# Patient Record
Sex: Male | Born: 1970 | Race: White | Hispanic: No | Marital: Married | Smoking: Former smoker
Health system: Southern US, Community
[De-identification: ages and names within clinical notes are randomized; demographics above are authoritative.]

## PROBLEM LIST (undated history)

## (undated) DIAGNOSIS — F32A Depression, unspecified: Secondary | ICD-10-CM

## (undated) DIAGNOSIS — L700 Acne vulgaris: Secondary | ICD-10-CM

## (undated) DIAGNOSIS — J189 Pneumonia, unspecified organism: Secondary | ICD-10-CM

## (undated) DIAGNOSIS — E781 Pure hyperglyceridemia: Secondary | ICD-10-CM

## (undated) DIAGNOSIS — IMO0002 Reserved for concepts with insufficient information to code with codable children: Secondary | ICD-10-CM

## (undated) DIAGNOSIS — J45909 Unspecified asthma, uncomplicated: Secondary | ICD-10-CM

## (undated) DIAGNOSIS — K429 Umbilical hernia without obstruction or gangrene: Secondary | ICD-10-CM

## (undated) DIAGNOSIS — F419 Anxiety disorder, unspecified: Secondary | ICD-10-CM

## (undated) DIAGNOSIS — G629 Polyneuropathy, unspecified: Secondary | ICD-10-CM

## (undated) DIAGNOSIS — F329 Major depressive disorder, single episode, unspecified: Secondary | ICD-10-CM

## (undated) DIAGNOSIS — M1990 Unspecified osteoarthritis, unspecified site: Secondary | ICD-10-CM

## (undated) DIAGNOSIS — C801 Malignant (primary) neoplasm, unspecified: Secondary | ICD-10-CM

## (undated) DIAGNOSIS — G709 Myoneural disorder, unspecified: Secondary | ICD-10-CM

## (undated) HISTORY — PX: KNEE ARTHROSCOPY: SHX127

## (undated) HISTORY — DX: Pure hyperglyceridemia: E78.1

## (undated) HISTORY — DX: Unspecified asthma, uncomplicated: J45.909

## (undated) HISTORY — DX: Depression, unspecified: F32.A

## (undated) HISTORY — PX: TONSILLECTOMY: SHX5217

## (undated) HISTORY — DX: Anxiety disorder, unspecified: F41.9

## (undated) HISTORY — DX: Major depressive disorder, single episode, unspecified: F32.9

## (undated) HISTORY — PX: POLYPECTOMY: SHX149

## (undated) HISTORY — PX: OTHER SURGICAL HISTORY: SHX169

## (undated) HISTORY — PX: HUMERUS FRACTURE SURGERY: SHX670

## (undated) HISTORY — DX: Reserved for concepts with insufficient information to code with codable children: IMO0002

## (undated) HISTORY — PX: COLONOSCOPY: SHX174

## (undated) HISTORY — PX: KNEE ARTHROSCOPY: SUR90

## (undated) HISTORY — DX: Acne vulgaris: L70.0

## (undated) HISTORY — DX: Malignant (primary) neoplasm, unspecified: C80.1

---

## 1998-05-20 HISTORY — PX: DEBRIDEMENT TENNIS ELBOW: SHX1442

## 1999-11-05 ENCOUNTER — Encounter: Admission: RE | Admit: 1999-11-05 | Discharge: 1999-11-05 | Payer: Self-pay | Admitting: Orthopedic Surgery

## 1999-11-05 ENCOUNTER — Encounter: Payer: Self-pay | Admitting: Orthopedic Surgery

## 2000-09-23 ENCOUNTER — Ambulatory Visit (HOSPITAL_BASED_OUTPATIENT_CLINIC_OR_DEPARTMENT_OTHER): Admission: RE | Admit: 2000-09-23 | Discharge: 2000-09-23 | Payer: Self-pay | Admitting: Plastic Surgery

## 2000-10-08 ENCOUNTER — Emergency Department (HOSPITAL_COMMUNITY): Admission: EM | Admit: 2000-10-08 | Discharge: 2000-10-08 | Payer: Self-pay | Admitting: Emergency Medicine

## 2003-06-07 ENCOUNTER — Emergency Department (HOSPITAL_COMMUNITY): Admission: EM | Admit: 2003-06-07 | Discharge: 2003-06-07 | Payer: Self-pay | Admitting: *Deleted

## 2005-05-20 DIAGNOSIS — IMO0002 Reserved for concepts with insufficient information to code with codable children: Secondary | ICD-10-CM

## 2005-05-20 HISTORY — DX: Reserved for concepts with insufficient information to code with codable children: IMO0002

## 2009-04-28 ENCOUNTER — Emergency Department (HOSPITAL_COMMUNITY): Admission: EM | Admit: 2009-04-28 | Discharge: 2009-04-28 | Payer: Self-pay | Admitting: Family Medicine

## 2010-10-05 NOTE — Op Note (Signed)
[place]. [facility]  Patient:    LIBORIO, SACCENTE                         MRN: [id number] Proc. Date: 09/23/00 Adm. Date:  [id number] Attending:  Loura Halt Ii                           Operative Report  PREOPERATIVE DIAGNOSIS: 1. A 1.5 cm sebaceous cyst right malar area. 2. A 1 cm sebaceous cyst, left earlobe. 3. Sebaceous cyst x 2, left post auricular sulcus, each cyst approximately    4 mm in diameter.  POSTOPERATIVE DIAGNOSIS: 1. A 1.5 cm sebaceous cyst right malar area. 2. A 1 cm sebaceous cyst, left earlobe. 3. Sebaceous cyst x 2, left post auricular sulcus, each cyst approximately    4 mm in diameter.  OPERATION PERFORMED:  Excision of sebaceous cyst x 4.  SURGEON:  Alfredia Ferguson, M.D.  ANESTHESIA:  2% Xylocaine with 1:100,000 epinephrine.  INDICATIONS FOR PROCEDURE:  The patient is a 40 year old gentleman with a history of many sebaceous cysts of his face.  He has longstanding history of acne which is under control at the present time.  These cysts have been enlarging and he wishes to have them removed.  He understands that he will be trading what he has for permanent potentially unsightly scar.  He understands the risk of recurrence of the cysts.  In spite of that he wishes to proceed with the surgery.  DESCRIPTION OF PROCEDURE:  Skin markers were placed over the right malar cyst and local anesthesia was infiltrated using 2% Xylocaine 1:100,000 epinephrine. Elliptical skin marker was placed in the posterior aspect of the left earlobe. There was a previous scar from an excision in the past.  Local anesthesia was infiltrated in a similar fashion.  The patient had two cysts on the postauricular sulcus, each separated by about 1 cm.  Skin markers were placed over the two cysts and local anesthesia was infiltrated.  The right malar area was prepped and draped in sterile fashion.  After waiting approximately 10 minutes, a 1 cm incision  was made directly over the cyst.  The cyst was visualized and it was dissected out from the surrounding tissue without difficulty.  The cyst was passed off for pathology.  Hemostasis was accomplished using pressure.  The wound was closed by approximating the subcutaneous tissue using 5-0 Vicryl suture.  The skin was united using a running 6-0 nylon suture.  Attention was directed to the earlobe cyst.  It was prepped and draped in a sterile fashion.  An ellipse of skin was incised. Using scissor dissection, the cyst was dissected away from the surrounding tissue.  The cyst was noted to be densely adherent to the undersurface of the anterior portion of the earlobe.  A counter incision in the anterior portion of the earlobe was made in order to excise a small ellipse of skin which was densely adherent to the cyst.  The cyst was removed along with a small portion of anterior earlobe skin.  Hemostasis was accomplished using pressure. The anterior earlobe incision was closed using interrupted 6-0 nylon suture.  The posterior earlobe incision was closed in a similar fashion.  The postauricular cysts were first incised, elevating the skin off the cysts.  The upper cyst was removed without difficulty.  The lower cyst I excised a small ellipse of skin  because there was a dimple in the skin over this cyst.  Both cysts were removed without difficulty.  These two incisions were closed using interrupted 5-0 nylon.  The patient tolerated the procedure well with minimal blood loss. light dressings were applied to all the incisions.  The patient was discharged home in satisfactory condition. DD:  09/23/00 TD:  09/23/00 Job: [id number]

## 2011-06-17 ENCOUNTER — Encounter: Payer: Self-pay | Admitting: Family Medicine

## 2011-06-17 ENCOUNTER — Ambulatory Visit (INDEPENDENT_AMBULATORY_CARE_PROVIDER_SITE_OTHER): Payer: BC Managed Care – PPO | Admitting: Family Medicine

## 2011-06-17 VITALS — BP 102/78 | HR 60 | Ht 73.0 in | Wt 187.0 lb

## 2011-06-17 DIAGNOSIS — R5381 Other malaise: Secondary | ICD-10-CM

## 2011-06-17 DIAGNOSIS — E781 Pure hyperglyceridemia: Secondary | ICD-10-CM

## 2011-06-17 DIAGNOSIS — IMO0002 Reserved for concepts with insufficient information to code with codable children: Secondary | ICD-10-CM

## 2011-06-17 DIAGNOSIS — Z0000 Encounter for general adult medical examination without abnormal findings: Secondary | ICD-10-CM

## 2011-06-17 DIAGNOSIS — Z8042 Family history of malignant neoplasm of prostate: Secondary | ICD-10-CM

## 2011-06-17 DIAGNOSIS — M542 Cervicalgia: Secondary | ICD-10-CM | POA: Insufficient documentation

## 2011-06-17 LAB — CBC WITH DIFFERENTIAL/PLATELET
Eosinophils Absolute: 0.1 10*3/uL (ref 0.0–0.7)
Eosinophils Relative: 1 % (ref 0–5)
HCT: 47.3 % (ref 39.0–52.0)
Hemoglobin: 15.6 g/dL (ref 13.0–17.0)
Lymphs Abs: 2.6 10*3/uL (ref 0.7–4.0)
MCH: 31.7 pg (ref 26.0–34.0)
MCV: 96.1 fL (ref 78.0–100.0)
Monocytes Absolute: 0.5 10*3/uL (ref 0.1–1.0)
Monocytes Relative: 8 % (ref 3–12)
Neutrophils Relative %: 48 % (ref 43–77)
RBC: 4.92 MIL/uL (ref 4.22–5.81)

## 2011-06-17 LAB — COMPREHENSIVE METABOLIC PANEL
AST: 23 U/L (ref 0–37)
Alkaline Phosphatase: 54 U/L (ref 39–117)
BUN: 9 mg/dL (ref 6–23)
Creat: 0.8 mg/dL (ref 0.50–1.35)
Glucose, Bld: 85 mg/dL (ref 70–99)
Total Bilirubin: 0.4 mg/dL (ref 0.3–1.2)

## 2011-06-17 LAB — POCT URINALYSIS DIPSTICK
Blood, UA: NEGATIVE
Glucose, UA: NEGATIVE
Ketones, UA: NEGATIVE
Spec Grav, UA: 1.01

## 2011-06-17 LAB — LIPID PANEL
Cholesterol: 159 mg/dL (ref 0–200)
LDL Cholesterol: 97 mg/dL (ref 0–99)
Total CHOL/HDL Ratio: 4 Ratio
Triglycerides: 110 mg/dL (ref <150)
VLDL: 22 mg/dL (ref 0–40)

## 2011-06-17 NOTE — Progress Notes (Signed)
Robert Hatfield is a 41 y.o. male who presents for a complete physical.  He has the following concerns: None.  Has chronic neck pain, but no radiculopathy.  Has had many cortisone shots and nerve block through Dr. Ethelene Hal.  Has daily pain that is usually adequately controlled with Aleve, taking 2 in the morning, and occasionally in the evenings when needed  Health Maintenance:  There is no immunization history on file for this patient. Last tetanus was approximately 2 years ago.  Unsure of type.  Will check records Last colonoscopy: never Last PSA: never Dentist: within 2 years Ophtho: never.  Having trouble reading Exercise: doing a boot camp 5 days/week for the last month.  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 Ethelene Hal)  . Pure hyperglyceridemia   . Acne vulgaris     s/p accutane treatment  . Depression 2000-2002    treated with Zoloft and Klonopin x 1-2 yrs    Past Surgical History  Procedure Date  . Humerus fracture surgery age 89    R arm  . Tennis elbow repair     R arm  . Tonsillectomy child    History   Social History  . Marital Status: Married    Spouse Name: N/A    Number of Children: 2  . Years of Education: N/A   Occupational History  . general contractor    Social History Main Topics  . Smoking status: Former Smoker    Quit date: 05/21/2003  . Smokeless tobacco: Never Used  . Alcohol Use: Yes     usually 1 drink/day (not in January)  . Drug Use: No  . Sexually Active: Yes -- Male partner(s)     wife has IUD   Other Topics Concern  . Not on file   Social History Narrative   Lives with wife, 2 sons and a dog    Family History  Problem Relation Age of Onset  . Depression Mother   . Hypertension Father   . Cancer Father     prostate cancer, 62's  . Depression Brother   . Cancer Paternal Grandmother     breast cancer  . Diabetes Neg Hx   . Heart disease Neg Hx     Current outpatient prescriptions:naproxen sodium (ANAPROX) 220 MG tablet, Take 440 mg by mouth 2 (two) times daily., Disp: , Rfl:   No Known Allergies  ROS:  The patient denies anorexia, fever, weight changes, headaches,  vision loss, decreased hearing, ear pain, hoarseness, chest pain, palpitations, dizziness, syncope, dyspnea on exertion, cough, swelling, nausea, vomiting, diarrhea, constipation, abdominal pain, melena, hematochezia, indigestion/heartburn, hematuria, incontinence, erectile dysfunction, nocturia, weakened urine stream, dysuria, genital lesions, tremor, suspicious skin lesions, depression, anxiety, abnormal bleeding/bruising, or enlarged lymph nodes +neck pain; some weakness in fingers, notices when opening things.  Occasional numbness/tingling in L hand (chronic/unchanged)  PHYSICAL EXAM: BP 102/78  Pulse 60  Ht 6\' 1"  (1.854 m)  Wt 187 lb (84.823 kg)  BMI 24.67 kg/m2  General Appearance:    Alert, cooperative, no distress, appears stated age  Head:    Normocephalic, without obvious abnormality, atraumatic  Eyes:    PERRL, conjunctiva/corneas clear, EOM's intact, fundi    benign  Ears:    Normal TM's and external ear canals  Nose:   Nares normal, mucosa normal, no drainage or sinus   tenderness  Throat:   Lips, mucosa, and tongue normal;  teeth and gums normal  Neck:   Supple, no lymphadenopathy;  thyroid:  no   enlargement/tenderness/nodules; no carotid   bruit or JVD  Back:    Spine nontender, no curvature, ROM normal, no CVA     tenderness  Lungs:     Clear to auscultation bilaterally without wheezes, rales or     ronchi; respirations unlabored  Chest Wall:    No tenderness or deformity   Heart:    Regular rate and rhythm, S1 and S2 normal, no murmur, rub   or gallop  Breast Exam:    No chest wall tenderness, masses or gynecomastia  Abdomen:     Soft, non-tender, nondistended, normoactive bowel sounds,    no masses, no hepatosplenomegaly  Genitalia:    Normal  male external genitalia without lesions.  Testicles without masses.  No inguinal hernias.  Rectal:    Normal sphincter tone, no masses or tenderness; guaiac negative stool.  Prostate smooth, no nodules, not enlarged.  Extremities:   No clubbing, cyanosis or edema  Pulses:   2+ and symmetric all extremities  Skin:   Skin color, texture, turgor normal, no rashes or lesions  Lymph nodes:   Cervical, supraclavicular, and axillary nodes normal  Neurologic:   CNII-XII intact, normal strength, sensation and gait; Diminished reflex at L biceps, normal on right.           Psych:   Normal mood, affect, hygiene and grooming.    ASSESSMENT/PLAN:  1. Routine general medical examination at a health care facility  POCT Urinalysis Dipstick, Visual acuity screening, PSA, Comprehensive metabolic panel, CBC with Differential  2. Family history of prostate cancer  PSA  3. Pure hyperglyceridemia  Lipid panel  4. Other malaise and fatigue  Comprehensive metabolic panel, CBC with Differential  5. Bulging discs    6. Cervicalgia     Discussed PSA screening (risks/benefits) in great detail, and wishes to proceed, understanding that any abnormal test requires lengthy discussion with urologist regarding treatment options.  Recommended at least 30 minutes of aerobic activity at least 5 days/week; proper sunscreen use reviewed; healthy diet and alcohol recommendations (less than or equal to 2 drinks/day) reviewed; regular seatbelt use; changing batteries in smoke detectors. Self-testicular exams. Immunization recommendations discussed--need to review prior records.  Colonoscopy recommendations reviewed--age 37.   Declines flu shot, recommended for next year Sign release form for immunizations through [facility] (or [facility] to check dates and type of last tetanus Past due for dentist--please schedule routine screening Routine eye exam is recommended

## 2011-06-17 NOTE — Patient Instructions (Signed)

## 2011-06-18 ENCOUNTER — Encounter: Payer: Self-pay | Admitting: Family Medicine

## 2011-07-18 ENCOUNTER — Encounter: Payer: Self-pay | Admitting: *Deleted

## 2012-03-13 ENCOUNTER — Encounter: Payer: Self-pay | Admitting: Internal Medicine

## 2012-04-09 ENCOUNTER — Encounter: Payer: Self-pay | Admitting: Family Medicine

## 2012-04-09 ENCOUNTER — Ambulatory Visit (INDEPENDENT_AMBULATORY_CARE_PROVIDER_SITE_OTHER): Payer: 59 | Admitting: Family Medicine

## 2012-04-09 VITALS — BP 138/88 | HR 76 | Ht 73.0 in | Wt 192.0 lb

## 2012-04-09 DIAGNOSIS — F411 Generalized anxiety disorder: Secondary | ICD-10-CM | POA: Insufficient documentation

## 2012-04-09 DIAGNOSIS — Z23 Encounter for immunization: Secondary | ICD-10-CM

## 2012-04-09 MED ORDER — ALPRAZOLAM 0.5 MG PO TABS: 0.2500 mg | ORAL_TABLET | Freq: Three times a day (TID) | ORAL | Status: DC | PRN

## 2012-04-09 NOTE — Progress Notes (Signed)
Chief Complaint  Patient presents with  . Anxiety    due to increased stress. Has been going on about 10 days, not getting any better. Tried to get an appt with Dr.Kaur, cannot see until Jan 23rd.   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  In the last 10 days things at work have gotten even more stressful, and he is feeling completely overwhelmed, chest pressure like someone is standing on him.  Denies shortness of breath, heart racing. The pressure is constant, all day long. Trouble falling asleep, which has been somewhat chronic, but worse recently.  Hasn't been able to get any exercise, too busy, too much going on, can't focus on much.  Business stress, which is leading to financial distress. He has not increased alcohol intake or tried to self-medicate anyway.  He is here to seek help for his anxiety.  He called Dr. Evelene Croon and Dr. Nolen Mu (whom he previously saw) but appointments are too far out.  Denies h/o anxiety.  Has had some mild issues with depression in the past, treated with zoloft in the past.  Records reviewed--last Td 09/2000.  He never returned call to arrange for [place] for Tdap. Agreeable to getting today.  Past Medical History  Diagnosis Date  . 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 Ethelene Hal)  . Pure hyperglyceridemia   . Acne vulgaris     s/p accutane treatment  . Depression 2000-2002    treated with Zoloft and Klonopin x 1-2 yrs   Past Surgical History  Procedure Date  . Humerus fracture surgery age 71    R arm  . Tennis elbow repair     R arm  . Tonsillectomy child   History   Social History  . Marital Status: Married    Spouse Name: N/A    Number of Children: 2  . Years of Education: N/A   Occupational History  . general contractor    Social History Main Topics  . Smoking status: Former Smoker    Quit date: 05/21/2003  . Smokeless tobacco: Never Used  . Alcohol Use: Yes     Comment: usually 1  drink/day (not in January)  . Drug Use: No  . Sexually Active: Yes -- Male partner(s)     Comment: wife has IUD   Other Topics Concern  . Not on file   Social History Narrative   Lives with wife, 2 sons and a dog   No current outpatient prescriptions on file prior to visit.   No Known Allergies  ROS:  Denies headaches, weight change, fever, URI symptoms.  +anxiety, + insomnia.  No nausea, vomiting, diarrhea, heartburn, exertional chest pain, shortness of breath or other concerns except per HPI.  PHYSICAL EXAM: BP 138/88  Pulse 76  Ht 6\' 1"  (1.854 m)  Wt 192 lb (87.091 kg)  BMI 25.33 kg/m2 Well developed male, in no distress Neck: no lymphadenopathy, thyromegaly or mass Heart: regular rate and rhythm without murmur Lungs: clear bilaterally Psych: flat affect,  Normal speech, hygiene and grooming.  Fairly good eye contact.  ASSESSMENT/PLAN: 1. Anxiety state, unspecified  ALPRAZolam (XANAX) 0.5 MG tablet  2. Need for Tdap vaccination  Tdap vaccine greater than or equal to 7yo IM   Xanax 0.5 #30--risks and side effects reviewed.  Short-term only. Counseling encouraged--given names of counselors, and can check with insurance.    Counseled regarding stress reduction techniques, exercise.  F/u here  or with psych if not improving, for daily/preventative treatment of anxiety, but for now I think short-term use of benzo with counseling is appropriate.  25 min visit, more than 1/2 counseling.

## 2012-04-09 NOTE — Patient Instructions (Signed)
Use the xanax with caution while driving/working.  Do not mix with alcohol. Counseling is encouraged.  I gave you phone numbers for Berniece Andreas and Maxcine Ham.  If they do not take your insurance, feel free to call me for recommendations based on the list of people who do take your insurance.  If symptoms are not resolving with counseling, then you may need a daily medication to treat/prevent anxiety, rather than just using the xanax.  You can either return here to discuss, versus seeing Dr. Evelene Croon or Dr. Nolen Mu.

## 2012-04-27 ENCOUNTER — Ambulatory Visit (INDEPENDENT_AMBULATORY_CARE_PROVIDER_SITE_OTHER): Payer: 59 | Admitting: Licensed Clinical Social Worker

## 2012-04-27 DIAGNOSIS — F411 Generalized anxiety disorder: Secondary | ICD-10-CM

## 2012-05-04 ENCOUNTER — Ambulatory Visit (INDEPENDENT_AMBULATORY_CARE_PROVIDER_SITE_OTHER): Payer: 59 | Admitting: Licensed Clinical Social Worker

## 2012-05-04 DIAGNOSIS — F411 Generalized anxiety disorder: Secondary | ICD-10-CM

## 2012-05-22 ENCOUNTER — Telehealth: Payer: Self-pay | Admitting: Internal Medicine

## 2012-05-22 ENCOUNTER — Ambulatory Visit: Payer: 59 | Admitting: Licensed Clinical Social Worker

## 2012-05-22 DIAGNOSIS — F411 Generalized anxiety disorder: Secondary | ICD-10-CM

## 2012-05-22 MED ORDER — ALPRAZOLAM 0.5 MG PO TABS: 0.2500 mg | ORAL_TABLET | Freq: Three times a day (TID) | ORAL | Status: DC | PRN

## 2012-05-22 NOTE — Telephone Encounter (Signed)
It appears that he has started counseling.  Therefore okay to refill #30 (only short-term use of med planned, if needing longer, will need to start preventative med).  Please call in #30, no refill.  thanks

## 2012-05-22 NOTE — Telephone Encounter (Signed)
Called med into [facility]

## 2013-05-07 ENCOUNTER — Ambulatory Visit (INDEPENDENT_AMBULATORY_CARE_PROVIDER_SITE_OTHER): Payer: Commercial Managed Care - PPO | Admitting: Medical

## 2013-05-07 ENCOUNTER — Encounter: Payer: Self-pay | Admitting: Medical

## 2013-05-07 VITALS — BP 128/90 | HR 68 | Temp 98.3°F

## 2013-05-07 DIAGNOSIS — R05 Cough: Secondary | ICD-10-CM

## 2013-05-07 DIAGNOSIS — J209 Acute bronchitis, unspecified: Secondary | ICD-10-CM

## 2013-05-07 DIAGNOSIS — R059 Cough, unspecified: Secondary | ICD-10-CM

## 2013-05-07 MED ORDER — HYDROCODONE-HOMATROPINE 5-1.5 MG/5ML PO SYRP: 5.0000 mL | ORAL_SOLUTION | Freq: Four times a day (QID) | ORAL | Status: DC | PRN

## 2013-05-07 MED ORDER — AZITHROMYCIN 250 MG PO TABS: ORAL_TABLET | ORAL | Status: DC

## 2013-05-07 NOTE — Progress Notes (Signed)
Subjective:  Robert Hatfield is a 42 y.o. male who presents for cough, illness.  Here with wife who is here for sinus pressure and cough.  Symptoms include 7-10 day hx/o severe cough, dry cough, occasional SOB, ear pressure, chest congestion, post nasal drainage, cough worse at night.   Denies fever, NVD, sinus pressure, sore throat, ear pain, rash, body aches. Treatment to date: none.  Wife here as well for illness, +sick contacts.   He does not smoke. No other aggravating or relieving factors.  No other c/o.  The following portions of the patient's history were reviewed and updated as appropriate: allergies, current medications, past family history, past medical history, past social history, past surgical history and problem list.  ROS as in subjective  Past Medical History  Diagnosis Date  . 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 Ethelene Hal)  . Pure hyperglyceridemia   . Acne vulgaris     s/p accutane treatment  . Depression 2000-2002    treated with Zoloft and Klonopin x 1-2 yrs     Objective: Vital signs reviewed  General appearance: Alert, WD/WN, no distress,somewhat  ill appearing                             Skin: warm, no rash, no diaphoresis                           Head: no sinus tenderness                            Eyes: conjunctiva normal, corneas clear, PERRLA                            Ears: pearly TMs, external ear canals normal                          Nose: septum midline, turbinates swollen, with erythema and clear discharge             Mouth/throat: MMM, tongue normal, mild pharyngeal erythema                           Neck: supple, no adenopathy, no thyromegaly, nontender                          Heart: RRR, normal S1, S2, no murmurs                         Lungs: +bronchial breath sounds, +scattered rhonchi, no wheezes, no rales                Extremities: no edema, nontender     Assessment: Encounter Diagnoses  Name Primary?  . Cough Yes  .  Acute bronchitis     Plan:  Medication orders today include: Zpak, Hycodan syrup prn.  Discussed diagnosis and treatment.  Suggested symptomatic OTC remedies for cough and congestion.  Tylenol or Ibuprofen OTC for fever and malaise.  Call/return in 2-3 days if symptoms are worse or not improving.

## 2013-09-21 ENCOUNTER — Ambulatory Visit (INDEPENDENT_AMBULATORY_CARE_PROVIDER_SITE_OTHER): Payer: Commercial Managed Care - PPO | Admitting: Medical

## 2013-09-21 ENCOUNTER — Encounter: Payer: Self-pay | Admitting: Medical

## 2013-09-21 VITALS — BP 132/80 | HR 80 | Temp 98.5°F | Resp 14 | Wt 214.0 lb

## 2013-09-21 DIAGNOSIS — L255 Unspecified contact dermatitis due to plants, except food: Secondary | ICD-10-CM

## 2013-09-21 DIAGNOSIS — K429 Umbilical hernia without obstruction or gangrene: Secondary | ICD-10-CM

## 2013-09-21 MED ORDER — TRIAMCINOLONE ACETONIDE 0.1 % EX CREA: 1.0000 "application " | TOPICAL_CREAM | Freq: Two times a day (BID) | CUTANEOUS | Status: DC

## 2013-09-21 MED ORDER — PREDNISONE 20 MG PO TABS: ORAL_TABLET | ORAL | Status: DC

## 2013-09-21 NOTE — Progress Notes (Signed)
Subjective:   Robert Hatfield is a 43 y.o. male who presents for evaluation of a rash involving the face and upper extremity. Rash started 2 days ago. Lesions are pink, and flat in texture. Rash has not changed over time. Rash is pruritic. Associated symptoms: none. Patient denies: abdominal pain, arthralgia, fever, headache, irritability and myalgia. Patient has not had contacts with similar rash. Patient has had new exposures - poison ivy doing yard work this weekend.  Also has new belly button hernia wants looked at.  Not painful.    The following portions of the patient's history were reviewed and updated as appropriate: allergies, current medications, past family history, past medical history, past social history and problem list.  Review of Systems As in subjective above   Objective:   Gen: wd, wn, nad Skin: faint inflamed pink/red coloration of left lateral orbit, left forearm in small area, no other rash Abdomen: 1.5 cm reducible nontender umbiilcal hernia   Assessment:   Encounter Diagnoses  Name Primary?  . Plant dermatitis Yes  . Umbilical hernia      Plan:   Discussed symptoms and exam findings.  Etiology most likely poison ivy dermatitis  Prescribed: short course of prednisone, triamcinolone cream.  Advised avoidance of plant, soap and water cleaning for clothes, body, benadryl oral q4-6 hours, prednisone, triamcinolone cream for areas other than face/genitals. F/u prn.   Umbilical hernia - small, discussed diagnosis, treatment, and for now since he is having no symptoms, reassured  Follow up prn

## 2013-09-21 NOTE — Patient Instructions (Signed)
  Thank you for giving me the opportunity to serve you today.    Your diagnosis today includes: Encounter Diagnoses  Name Primary?  . Plant dermatitis Yes  . Umbilical hernia      Specific recommendations today include:  Begin Benadryl either at bedtime or up to every 4-6 hours for rash and itching  Begin Prednisone 20mg , 2 tablets daily for 3 days (steroid)  You can continue Hydrocortisone OTC for facial rash, Triamcinolone prescription cream for arm/body/leg rash  Clean clothes and body with soap and water to removed plant oils  Recheck if not improving.  Follow up as needed.   I have included other useful information below for your review.   Poison [employer] ivy is a inflammation of the skin (contact dermatitis) caused by touching the allergens on the leaves of the ivy plant following previous exposure to the plant. The rash usually appears 48 hours after exposure. The rash is usually bumps (papules) or blisters (vesicles) in a linear pattern. Depending on your own sensitivity, the rash may simply cause redness and itching, or it may also progress to blisters which may break open. These must be well cared for to prevent secondary bacterial (germ) infection, followed by scarring. Keep any open areas dry, clean, dressed, and covered with an antibacterial ointment if needed. The eyes may also get puffy. The puffiness is worst in the morning and gets better as the day progresses. This dermatitis usually heals without scarring, within 2 to 3 weeks without treatment. HOME CARE INSTRUCTIONS  Thoroughly wash with soap and water as soon as you have been exposed to poison ivy. You have about one half hour to remove the plant resin before it will cause the rash. This washing will destroy the oil or antigen on the skin that is causing, or will cause, the rash. Be sure to wash under your fingernails as any plant resin there will continue to spread the rash. Do not rub skin vigorously when  washing affected area. Poison ivy cannot spread if no oil from the plant remains on your body. A rash that has progressed to weeping sores will not spread the rash unless you have not washed thoroughly. It is also important to wash any clothes you have been wearing as these may carry active allergens. The rash will return if you wear the unwashed clothing, even several days later. Avoidance of the plant in the future is the best measure. Poison ivy plant can be recognized by the number of leaves. Generally, poison ivy has three leaves with flowering branches on a single stem. Diphenhydramine may be purchased over the counter and used as needed for itching. Do not drive with this medication if it makes you drowsy.Ask your caregiver about medication for children. SEEK MEDICAL CARE IF:  Open sores develop.  Redness spreads beyond area of rash.  You notice purulent (pus-like) discharge.  You have increased pain.  Other signs of infection develop (such as fever). Document Released: 05/03/2000 Document Revised: 07/29/2011 Document Reviewed: 03/22/2009 [facility] Patient Information 2014 [place], [place].

## 2013-12-15 ENCOUNTER — Ambulatory Visit (INDEPENDENT_AMBULATORY_CARE_PROVIDER_SITE_OTHER): Payer: BC Managed Care – PPO | Admitting: Family Medicine

## 2013-12-15 ENCOUNTER — Encounter: Payer: Self-pay | Admitting: Family Medicine

## 2013-12-15 VITALS — BP 130/84 | HR 80 | Temp 97.9°F | Ht 73.0 in | Wt 211.0 lb

## 2013-12-15 DIAGNOSIS — K439 Ventral hernia without obstruction or gangrene: Secondary | ICD-10-CM

## 2013-12-15 DIAGNOSIS — K429 Umbilical hernia without obstruction or gangrene: Secondary | ICD-10-CM

## 2013-12-15 DIAGNOSIS — F329 Major depressive disorder, single episode, unspecified: Secondary | ICD-10-CM | POA: Insufficient documentation

## 2013-12-15 DIAGNOSIS — R197 Diarrhea, unspecified: Secondary | ICD-10-CM

## 2013-12-15 DIAGNOSIS — R109 Unspecified abdominal pain: Secondary | ICD-10-CM

## 2013-12-15 DIAGNOSIS — F3289 Other specified depressive episodes: Secondary | ICD-10-CM

## 2013-12-15 LAB — POCT URINALYSIS DIPSTICK
Bilirubin, UA: NEGATIVE
Blood, UA: NEGATIVE
Glucose, UA: NEGATIVE
Ketones, UA: NEGATIVE
Leukocytes, UA: NEGATIVE
Nitrite, UA: NEGATIVE
Protein, UA: NEGATIVE
Spec Grav, UA: 1.02
Urobilinogen, UA: NEGATIVE
pH, UA: 5

## 2013-12-15 NOTE — Progress Notes (Signed)
Chief Complaint  Patient presents with  . Abdominal Pain    thinks he may have a hernia. Having pain(discomfort) and distension around his navel area. Bloating after eating and in the evenings. Wants to make sure he does not have an ulcer. Is taking wellbutrin rx'd by Dr.Kaur, unsure of dosage.    He had noticed that his belly button has been distended some, first noticed in April or May, just prior to his last visit here (with Audelia Acton).  Since that time he has developed more diffuse abdominal discomfort.  Some of it feels like bloating and gassiness, especially after eating.  Hasn't paid attention to any particular food triggers.  He was eating more salads and vegetables as part of a boot camp--but stopped that (due to discomfort) and hasn't improved. Zoloft upsets his stomach, causing diarrhea, which is why he has been tapering off.  He only takes it about every 3 days, and hasn't really noticed much improvement in his stomach discomfort/bloating or diarrhea since cutting back on the dose.  +milk/cheese in his diet, never had problems in the past, but is a staple in his diet.  He is having some fecal urgency, and stools are "almost always diarrhea"--unformed and/or watery.  Denies blood in the stool, no mucus.   Denies weight loss--he has gained. Denies recent antibiotics, travel, camping/stream water, undercooked/spoiled foods.  He attributes the zoloft to his bowel problems, ongoing x a few years.  He has backed down on the dose for at least a month, but so far hasn't noticed significant improvement in bowels. Moods remain good.  He is under the care of Dr. Toy Care for his anxiety and depression (although past due to see her).  Moods overall are doing much better, significant improvement in stressors overall.  He is in [employer], and does some heavy lifting.  Never had acute pain.   Past Medical History  Diagnosis Date  . 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 Nelva Bush)  . Pure  hyperglyceridemia   . Acne vulgaris     s/p accutane treatment  . Depression 2000-2002    treated with Zoloft and Klonopin x 1-2 yrs   Past Surgical History  Procedure Laterality Date  . Humerus fracture surgery  age 14    R arm  . Tennis elbow repair      R arm  . Tonsillectomy  child   History   Social History  . Marital Status: Married    Spouse Name: N/A    Number of Children: 2  . Years of Education: N/A   Occupational History  . general contractor    Social History Main Topics  . Smoking status: Former Smoker    Quit date: 05/21/2003  . Smokeless tobacco: Never Used  . Alcohol Use: Yes     Comment: usually 2-3 beers/day   . Drug Use: No  . Sexual Activity: Yes    Partners: Female     Comment: wife has IUD   Other Topics Concern  . Not on file   Social History Narrative   Lives with wife, 2 sons and a dog   Outpatient Encounter Prescriptions as of 12/15/2013  Medication Sig Note  . ALPRAZolam (XANAX) 0.5 MG tablet Take 0.5-1 tablets (0.25-0.5 mg total) by mouth 3 (three) times daily as needed for sleep or anxiety.   . sertraline (ZOLOFT) 25 MG tablet Take 25 mg by mouth daily. 12/15/2013: weaing off currently, taking every 3rd day  . [DISCONTINUED]  predniSONE (DELTASONE) 20 MG tablet 2 tablets daily for 3 days   . [DISCONTINUED] triamcinolone cream (KENALOG) 0.1 % Apply 1 application topically 2 (two) times daily.    No Known Allergies  ROS: Denies fevers, chills, nausea, vomiting, reflux.  Denies allergy or URI symptoms, headaches, dizziness, chest pain, palpitations. +weight gain. Denies significant fatigue, rashes, bleeding/bruising or other concerns.  PHYSICAL EXAM: BP 130/84  Pulse 80  Temp(Src) 97.9 F (36.6 C) (Tympanic)  Ht 6\' 1"  (1.854 m)  Wt 211 lb (95.709 kg)  BMI 27.84 kg/m2 Well developed, pleasant male in no distress, only slightly anxious Neck: no lymphadenopathy, thyromegaly or mass Heart: regular rate and rhythm, no murmur Lungs:  clear bilaterally Abdomen: active bowel sounds.  Soft. Small ventral hernia, nontender Very small umbilical hernia--nontender.  Easily reducible. Very mild diffuse tenderness at R and L LQ.  No organomegaly or mass. Negative Murphy.  No epigastric or suprapubic tenderness. Skin: no visible rashes or lesions Psych: only very slightly anxious (related to his complaints/concerns).  Full range of affect. Normal hygiene, grooming, eye contact, speech Neuro: alert and oriented.  Normal strength, gait, cranial nerves.  Urine dip normal.  SG 1.020   ASSESSMENT/PLAN:  Abdominal pain, unspecified site - reassured no evidence of obstruction or strangulated hernia. Gas-X prn.  - Plan: POCT Urinalysis Dipstick  Diarrhea - not likely from zoloft, given persistence despite cutting back. May have component of lactose intolerance contributing, vs IBS  Ventral hernia without obstruction or gangrene - nontender, easily reducible.  Not the cause for his vague diffuse discomfort.  Weight loss encouraged  Umbilical hernia without obstruction and without gangrene - very small, reducible, nontender.  reassurred.  Depressive disorder, not elsewhere classified   Diffuse, mild abdominal pain, bloating, and loose stools.  Avoid dairy for at least a week (or lactose-free diet) Start probiotics (ie Align). Cut back on spicy/greasy foods. Avoid a lot of gas-producing foods (raw broccoli, beans). Use Simethicone (ie Gas-X) as needed for abdominal pain/bloating.  Try and cut back on alcohol intake to just 1-2/day.   Schedule CPE Encouraged to cut back on alcohol (for weight loss, h/o elevated TG, and no more than 2/d recommended in general)  Reviewed s/sx of obstruction, strangulated hernia, and to return immediately if these develop

## 2013-12-15 NOTE — Patient Instructions (Addendum)
Avoid dairy for at least a week (or lactose-free diet) Start probiotics (ie Align). Cut back on spicy/greasy foods. Avoid a lot of gas-producing foods (raw broccoli, beans). Use Simethicone (ie Gas-X) as needed for abdominal pain/bloating.  Try and cut back on alcohol intake to just 1-2/day.  Ventral Hernia A ventral hernia (also called an incisional hernia) is a hernia that occurs at the site of a previous surgical cut (incision) in the abdomen. The abdominal wall spans from your lower chest down to your pelvis. If the abdominal wall is weakened from a surgical incision, a hernia can occur. A hernia is a bulge of bowel or muscle tissue pushing out on the weakened part of the abdominal wall. Ventral hernias can get bigger from straining or lifting. Obese and older people are at higher risk for a ventral hernia. People who develop infections after surgery or require repeat incisions at the same site on the abdomen are also at increased risk. CAUSES  A ventral hernia occurs because of weakness in the abdominal wall at an incision site.  SYMPTOMS  Common symptoms include:  A visible bulge or lump on the abdominal wall.  Pain or tenderness around the lump.  Increased discomfort if you cough or make a sudden movement. If the hernia has blocked part of the intestine, a serious complication can occur (incarcerated or strangulated hernia). This can become a problem that requires emergency surgery because the blood flow to the blocked intestine may be cut off. Symptoms may include:  Feeling sick to your stomach (nauseous).  Throwing up (vomiting).  Stomach swelling (distention) or bloating.  Fever.  Rapid heartbeat. DIAGNOSIS  Your health care provider will take a medical history and perform a physical exam. Various tests may be ordered, such as:  Blood tests.  Urine tests.  Ultrasonography.  X-rays.  Computed tomography (CT). TREATMENT  Watchful waiting may be all that is needed  for a smaller hernia that does not cause symptoms. Your health care provider may recommend the use of a supportive belt (truss) that helps to keep the abdominal wall intact. For larger hernias or those that cause pain, surgery to repair the hernia is usually recommended. If a hernia becomes strangulated, emergency surgery needs to be done right away. HOME CARE INSTRUCTIONS  Avoid putting pressure or strain on the abdominal area.  Avoid heavy lifting.  Use good body positioning for physical tasks. Ask your health care provider about proper body positioning.  Use a supportive belt as directed by your health care provider.  Maintain a healthy weight.  Eat foods that are high in fiber, such as whole grains, fruits, and vegetables. Fiber helps prevent difficult bowel movements (constipation).  Drink enough fluids to keep your urine clear or pale yellow.  Follow up with your health care provider as directed. SEEK MEDICAL CARE IF:   Your hernia seems to be getting larger or more painful. SEEK IMMEDIATE MEDICAL CARE IF:   You have abdominal pain that is sudden and sharp.  Your pain becomes severe.  You have repeated vomiting.  You are sweating a lot.  You notice a rapid heartbeat.  You develop a fever. MAKE SURE YOU:   Understand these instructions.  Will watch your condition.  Will get help right away if you are not doing well or get worse. Document Released: 04/22/2012 Document Revised: 09/20/2013 Document Reviewed: 04/22/2012 [facility] Patient Information 2015 [place], [place]. This information is not intended to replace advice given to you by your health care provider.  Make sure you discuss any questions you have with your health care provider.

## 2014-05-30 ENCOUNTER — Encounter: Payer: Self-pay | Admitting: Family Medicine

## 2014-05-30 ENCOUNTER — Ambulatory Visit (INDEPENDENT_AMBULATORY_CARE_PROVIDER_SITE_OTHER): Payer: No Typology Code available for payment source | Admitting: Family Medicine

## 2014-05-30 VITALS — BP 138/90 | HR 84 | Ht 73.0 in | Wt 207.0 lb

## 2014-05-30 DIAGNOSIS — Z125 Encounter for screening for malignant neoplasm of prostate: Secondary | ICD-10-CM

## 2014-05-30 DIAGNOSIS — Z0000 Encounter for general adult medical examination without abnormal findings: Secondary | ICD-10-CM

## 2014-05-30 DIAGNOSIS — E781 Pure hyperglyceridemia: Secondary | ICD-10-CM

## 2014-05-30 DIAGNOSIS — Z3009 Encounter for other general counseling and advice on contraception: Secondary | ICD-10-CM

## 2014-05-30 DIAGNOSIS — Z23 Encounter for immunization: Secondary | ICD-10-CM

## 2014-05-30 DIAGNOSIS — Z309 Encounter for contraceptive management, unspecified: Secondary | ICD-10-CM

## 2014-05-30 DIAGNOSIS — Z8042 Family history of malignant neoplasm of prostate: Secondary | ICD-10-CM

## 2014-05-30 DIAGNOSIS — R5383 Other fatigue: Secondary | ICD-10-CM

## 2014-05-30 DIAGNOSIS — K429 Umbilical hernia without obstruction or gangrene: Secondary | ICD-10-CM | POA: Insufficient documentation

## 2014-05-30 DIAGNOSIS — K439 Ventral hernia without obstruction or gangrene: Secondary | ICD-10-CM | POA: Insufficient documentation

## 2014-05-30 LAB — LIPID PANEL
CHOLESTEROL: 224 mg/dL — AB (ref 0–200)
HDL: 58 mg/dL (ref >39)
LDL Cholesterol: 138 mg/dL — ABNORMAL HIGH (ref 0–99)
Total CHOL/HDL Ratio: 3.9 Ratio
Triglycerides: 141 mg/dL (ref <150)
VLDL: 28 mg/dL (ref 0–40)

## 2014-05-30 LAB — TSH: TSH: 1.5 u[IU]/mL (ref 0.350–4.500)

## 2014-05-30 LAB — COMPREHENSIVE METABOLIC PANEL
ALT: 32 U/L (ref 0–53)
AST: 28 U/L (ref 0–37)
Albumin: 4.4 g/dL (ref 3.5–5.2)
Alkaline Phosphatase: 65 U/L (ref 39–117)
BUN: 10 mg/dL (ref 6–23)
CO2: 25 mEq/L (ref 19–32)
CREATININE: 0.82 mg/dL (ref 0.50–1.35)
Calcium: 9.4 mg/dL (ref 8.4–10.5)
Chloride: 99 mEq/L (ref 96–112)
GLUCOSE: 88 mg/dL (ref 70–99)
POTASSIUM: 4.3 meq/L (ref 3.5–5.3)
Sodium: 137 mEq/L (ref 135–145)
Total Bilirubin: 0.5 mg/dL (ref 0.2–1.2)
Total Protein: 7.3 g/dL (ref 6.0–8.3)

## 2014-05-30 LAB — POCT URINALYSIS DIPSTICK
BILIRUBIN UA: NEGATIVE
GLUCOSE UA: NEGATIVE
Leukocytes, UA: NEGATIVE
NITRITE UA: NEGATIVE
PROTEIN UA: NEGATIVE
RBC UA: NEGATIVE
Spec Grav, UA: >=1.03
Urobilinogen, UA: NEGATIVE
pH, UA: 6

## 2014-05-30 NOTE — Patient Instructions (Addendum)
HEALTH MAINTENANCE RECOMMENDATIONS:  It is recommended that you get at least 30 minutes of aerobic exercise at least 5 days/week (for weight loss, you may need as much as 60-90 minutes). This can be any activity that gets your heart rate up. This can be divided in 10-15 minute intervals if needed, but try and build up your endurance at least once a week.  Weight bearing exercise is also recommended twice weekly.  Eat a healthy diet with lots of vegetables, fruits and fiber.  "Colorful" foods have a lot of vitamins (ie green vegetables, tomatoes, red peppers, etc).  Limit sweet tea, regular sodas and alcoholic beverages, all of which has a lot of calories and sugar.  Up to 2 alcoholic drinks daily may be beneficial for men (unless trying to lose weight, watch sugars).  Drink a lot of water.  Sunscreen of at least SPF 30 should be used on all sun-exposed parts of the skin when outside between the hours of 10 am and 4 pm (not just when at beach or pool, but even with exercise, golf, tennis, and yard work!)  Use a sunscreen that says "broad spectrum" so it covers both UVA and UVB rays, and make sure to reapply every 1-2 hours.  Remember to change the batteries in your smoke detectors when changing your clock times in the spring and fall.  Use your seat belt every time you are in a car, and please drive safely and not be distracted with cell phones and texting while driving.  I recommend a routine eye exam at some point.  Try and check your blood pressure periodically. Return if consistently >140/90.  Low sodium diet, weight loss and daily exercise will help keep the blood pressure down.  Low-Sodium Eating Plan Sodium raises blood pressure and causes water to be held in the body. Getting less sodium from food will help lower your blood pressure, reduce any swelling, and protect your heart, liver, and kidneys. We get sodium by adding salt (sodium chloride) to food. Most of our sodium comes from canned,  boxed, and frozen foods. Restaurant foods, fast foods, and pizza are also very high in sodium. Even if you take medicine to lower your blood pressure or to reduce fluid in your body, getting less sodium from your food is important. WHAT IS MY PLAN? Most people should limit their sodium intake to 2,300 mg a day. Your health care provider recommends that you limit your sodium intake to __________ a day.  WHAT DO I NEED TO KNOW ABOUT THIS EATING PLAN? For the low-sodium eating plan, you will follow these general guidelines:  Choose foods with a % Daily Value for sodium of less than 5% (as listed on the food label).   Use salt-free seasonings or herbs instead of table salt or sea salt.   Check with your health care provider or pharmacist before using salt substitutes.   Eat fresh foods.  Eat more vegetables and fruits.  Limit canned vegetables. If you do use them, rinse them well to decrease the sodium.   Limit cheese to 1 oz (28 g) per day.   Eat lower-sodium products, often labeled as "lower sodium" or "no salt added."  Avoid foods that contain monosodium glutamate (MSG). MSG is sometimes added to [place] food and some canned foods.  Check food labels (Nutrition Facts labels) on foods to learn how much sodium is in one serving.  Eat more home-cooked food and less restaurant, buffet, and fast food.  When eating  at a restaurant, ask that your food be prepared with less salt or none, if possible.  HOW DO I READ FOOD LABELS FOR SODIUM INFORMATION? The Nutrition Facts label lists the amount of sodium in one serving of the food. If you eat more than one serving, you must multiply the listed amount of sodium by the number of servings. Food labels may also identify foods as:  Sodium free--Less than 5 mg in a serving.  Very low sodium--35 mg or less in a serving.  Low sodium--140 mg or less in a serving.  Light in sodium--50% less sodium in a serving. For example, if a food that  usually has 300 mg of sodium is changed to become light in sodium, it will have 150 mg of sodium.  Reduced sodium--25% less sodium in a serving. For example, if a food that usually has 400 mg of sodium is changed to reduced sodium, it will have 300 mg of sodium. WHAT FOODS CAN I EAT? Grains Low-sodium cereals, including oats, puffed wheat and rice, and shredded wheat cereals. Low-sodium crackers. Unsalted rice and pasta. Lower-sodium bread.  Vegetables Frozen or fresh vegetables. Low-sodium or reduced-sodium canned vegetables. Low-sodium or reduced-sodium tomato sauce and paste. Low-sodium or reduced-sodium tomato and vegetable juices.  Fruits Fresh, frozen, and canned fruit. Fruit juice.  Meat and Other Protein Products Low-sodium canned tuna and salmon. Fresh or frozen meat, poultry, seafood, and fish. Lamb. Unsalted nuts. Dried beans, peas, and lentils without added salt. Unsalted canned beans. Homemade soups without salt. Eggs.  Dairy Milk. Soy milk. Ricotta cheese. Low-sodium or reduced-sodium cheeses. Yogurt.  Condiments Fresh and dried herbs and spices. Salt-free seasonings. Onion and garlic powders. Low-sodium varieties of mustard and ketchup. Lemon juice.  Fats and Oils Reduced-sodium salad dressings. Unsalted butter.  Other Unsalted popcorn and pretzels.  The items listed above may not be a complete list of recommended foods or beverages. Contact your dietitian for more options. WHAT FOODS ARE NOT RECOMMENDED? Grains Instant hot cereals. Bread stuffing, pancake, and biscuit mixes. Croutons. Seasoned rice or pasta mixes. Noodle soup cups. Boxed or frozen macaroni and cheese. Self-rising flour. Regular salted crackers. Vegetables Regular canned vegetables. Regular canned tomato sauce and paste. Regular tomato and vegetable juices. Frozen vegetables in sauces. Salted french fries. Olives. Angie Fava. Relishes. Sauerkraut. Salsa. Meat and Other Protein Products Salted,  canned, smoked, spiced, or pickled meats, seafood, or fish. Bacon, ham, sausage, hot dogs, corned beef, chipped beef, and packaged luncheon meats. Salt pork. Jerky. Pickled herring. Anchovies, regular canned tuna, and sardines. Salted nuts. Dairy Processed cheese and cheese spreads. Cheese curds. Blue cheese and cottage cheese. Buttermilk.  Condiments Onion and garlic salt, seasoned salt, table salt, and sea salt. Canned and packaged gravies. Worcestershire sauce. Tartar sauce. Barbecue sauce. Teriyaki sauce. Soy sauce, including reduced sodium. Steak sauce. Fish sauce. Oyster sauce. Cocktail sauce. Horseradish. Regular ketchup and mustard. Meat flavorings and tenderizers. Bouillon cubes. Hot sauce. Tabasco sauce. Marinades. Taco seasonings. Relishes. Fats and Oils Regular salad dressings. Salted butter. Margarine. Ghee. Bacon fat.  Other Potato and tortilla chips. Corn chips and puffs. Salted popcorn and pretzels. Canned or dried soups. Pizza. Frozen entrees and pot pies.  The items listed above may not be a complete list of foods and beverages to avoid. Contact your dietitian for more information. Document Released: 10/26/2001 Document Revised: 05/11/2013 Document Reviewed: 03/10/2013 [facility] Patient Information 2015 [place], [place]. This information is not intended to replace advice given to you by your health care provider. Make sure  you discuss any questions you have with your health care provider.

## 2014-05-30 NOTE — Progress Notes (Signed)
Chief Complaint  Patient presents with  . Annual Exam    fasting annual exam. No concerns today.    Robert Hatfield is a 44 y.o. male who presents for a complete physical.  He has the following concerns:  He would like referral for vasectomy (his wife no longer has IUD).  Seen in July with abdominal pain, bloating/gassiness, along with some loose stools and fecal urgency. He had attributed some of the GI issues to the zoloft, which he was tapering off at the time, but subsequently found it was related to dairy.  He stopped drinking milk and takes probiotics and has been much better. He was noted to have a small umbilical and ventral hernia. He seems to be "aware" of the hernias more recently, pressure.  Somewhat reluctant to exercise, as this causes discomfort.  He has been having 5 hr energy drinks lately, had one today.   He changed jobs, and is much happier overall.  He uses xanax prn, no longer requiring SSRI.  Still under the care of Dr. Toy Care.   Immunization History  Administered Date(s) Administered  . Td 10/08/2000  . Tdap 04/09/2012   Last colonoscopy: never Last PSA: 05/2011 Dentist: twice yearly Ophtho: never. got reading glasses Exercise: Walks all day on his job (12,000 steps some days)  Past Medical History  Diagnosis Date  . 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 Nelva Bush)  . Pure hyperglyceridemia   . Acne vulgaris     s/p accutane treatment  . Depression 2000-2002, 2015    treated with Zoloft and Klonopin x 1-2 yrs; recurrent in 2015    Past Surgical History  Procedure Laterality Date  . Humerus fracture surgery  age 37    R arm  . Tennis elbow repair      R arm  . Tonsillectomy  child  . Arthroscopic knee    . Knee arthroscopy Bilateral in his 73s    History   Social History  . Marital Status: Married    Spouse Name: N/A    Number of Children: 2  . Years of Education: N/A   Occupational History  . general contractor    Social  History Main Topics  . Smoking status: Former Smoker    Quit date: 05/21/2003  . Smokeless tobacco: Never Used  . Alcohol Use: 0.0 oz/week    0 Not specified per week     Comment: usually 2-3 beers/day   . Drug Use: No  . Sexual Activity:    Partners: Female   Other Topics Concern  . Not on file   Social History Narrative   Lives with wife, 2 sons. Builds apartment complexes    Family History  Problem Relation Age of Onset  . Depression Mother   . Cancer Mother     lung cancer (smoker)  . Hypertension Father   . Cancer Father     prostate cancer, 49's  . AAA (abdominal aortic aneurysm) Father     repaired  . Kidney disease Father     s/p AAA repair; on dialysis  . Depression Brother   . Cancer Paternal Grandmother     breast cancer  . Diabetes Neg Hx   . Heart disease Neg Hx     Outpatient Encounter Prescriptions as of 05/30/2014  Medication Sig Note  . ALPRAZolam (XANAX) 0.5 MG tablet Take 0.5-1 tablets (0.25-0.5 mg total) by mouth 3 (three) times daily as needed for sleep or anxiety. 05/30/2014:  Takes prn; rx'd by Dr. Toy Care  . Probiotic Product (PROBIOTIC DAILY PO) Take 1 tablet by mouth daily.   . [DISCONTINUED] sertraline (ZOLOFT) 25 MG tablet Take 25 mg by mouth daily. 12/15/2013: weaing off currently, taking every 3rd day    No Known Allergies  ROS: The patient denies anorexia, fever, weight changes, headaches, vision loss, decreased hearing, ear pain, hoarseness, chest pain, palpitations, dizziness, syncope, dyspnea on exertion, cough, swelling, nausea, vomiting, diarrhea, constipation, abdominal pain, melena, hematochezia, indigestion/heartburn, hematuria, incontinence, erectile dysfunction, nocturia, weakened urine stream, dysuria, genital lesions, tremor, suspicious skin lesions, depression, anxiety, abnormal bleeding/bruising, or enlarged lymph nodes. Moods are much better since he changed jobs.  Uses xanax prn--30 pills last 2 months, sporadic use. (sees Dr.  Toy Care) Some sinus congestion with season changes/allergies. No further neck pain, numbness or tingling.  Some abdominal discomfort related to hernia with exercise.   PHYSICAL EXAM:  BP 138/90 mmHg  Pulse 84  Ht 6\' 1"  (1.854 m)  Wt 207 lb (93.895 kg)  BMI 27.32 kg/m2 146/90 on repeat by MD  General Appearance:   Alert, cooperative, no distress, appears stated age  Head:   Normocephalic, without obvious abnormality, atraumatic  Eyes:   PERRL, conjunctiva/corneas clear, EOM's intact, fundi   benign  Ears:   Normal TM's and external ear canals  Nose:  Nares normal, mucosa normal, no drainage or sinus tenderness  Throat:  Lips, mucosa, and tongue normal; teeth and gums normal  Neck:  Supple, no lymphadenopathy; thyroid: no enlargement/tenderness/nodules; no carotid  bruit or JVD  Back:  Spine nontender, no curvature, ROM normal, no CVA tenderness  Lungs:   Clear to auscultation bilaterally without wheezes, rales or ronchi; respirations unlabored  Chest Wall:   No tenderness or deformity  Heart:   Regular rate and rhythm, S1 and S2 normal, no murmur, rub  or gallop  Breast Exam:   No chest wall tenderness, masses or gynecomastia  Abdomen:   Soft, non-tender, nondistended, normoactive bowel sounds,   no masses, no hepatosplenomegaly. Small ventral and umbilical hernia, easily reducible, nontender. Abdominal obesity  Genitalia:   Normal male external genitalia without lesions. Testicles without masses. No inguinal hernias.  Rectal:   Normal sphincter tone, no masses or tenderness; guaiac negative stool. Prostate smooth, no nodules, not enlarged.  Extremities:  No clubbing, cyanosis or edema  Pulses:  2+ and symmetric all extremities  Skin:  Skin color, texture, turgor normal, no rashes or lesions. Small pustule in pubic area, mildly tender (resolving, per pt)  Lymph nodes:  Cervical, supraclavicular, and  axillary nodes normal  Neurologic:  CNII-XII intact, normal strength, sensation and gait; DTRs symmetric    Psych: Normal mood, affect, hygiene and grooming.   ASSESSMENT/PLAN:  Annual physical exam - Plan: Visual acuity screening, POCT Urinalysis Dipstick, Lipid panel, Comprehensive metabolic panel, PSA, TSH  Hypertriglyceridemia - Plan: Lipid panel  Family history of prostate cancer - Plan: PSA  Screening for prostate cancer - Plan: PSA  Other fatigue - Plan: Comprehensive metabolic panel, TSH  Need for prophylactic vaccination and inoculation against influenza - Plan: Flu Vaccine QUAD 36+ mos PF IM (Fluarix Quad PF)  Ventral hernia without obstruction or gangrene  Umbilical hernia without obstruction or gangrene  Vasectomy evaluation   Discussed PSA screening (risks/benefits) in great detail, and wishes to proceed, understanding that any abnormal test requires lengthy discussion with urologist regarding treatment options. Recommended at least 30 minutes of aerobic activity at least 5 days/week; proper sunscreen use reviewed; healthy  diet and alcohol recommendations (less than or equal to 2 drinks/day) reviewed; regular seatbelt use; changing batteries in smoke detectors. Self-testicular exams. Immunization recommendations discussed--flu shot today. Colonoscopy recommendations reviewed--age 11. Weight loss encouraged (inches at waist).  Loss of abdominal fat will likely help with ventral hernia.  Reviewed s/sx of obstruction.  Declines eval by surgeon at this time.  Refer to urology for vasectomy  Elevated high blood pressure.--no h/o HTN.  Reviewed low sodium diet, exercise, weight loss.  Cut back on caffeine.  Monitor BP and f/u if BP remains elevated.  Cut back on fast food,  Declines general surgery consult at this time. Will try weight loss first  F/u 1 year, sooner prn, especially if BPs remain elevated

## 2014-05-31 LAB — PSA: PSA: 1.62 ng/mL (ref <=4.00)

## 2015-06-05 ENCOUNTER — Encounter: Payer: No Typology Code available for payment source | Admitting: Family Medicine

## 2015-06-05 ENCOUNTER — Telehealth: Payer: Self-pay | Admitting: *Deleted

## 2015-06-05 DIAGNOSIS — Z0000 Encounter for general adult medical examination without abnormal findings: Secondary | ICD-10-CM

## 2015-06-05 NOTE — Telephone Encounter (Signed)
Doesn't need to r/s at this time, but should be charged no show fee/sent letter

## 2015-06-05 NOTE — Telephone Encounter (Signed)
This patient no showed for their appointment today.Which of the following is necessary for this patient.   A) No follow-up necessary   B) Follow-up urgent. Locate Patient Immediately.   C) Follow-up necessary. Contact patient and Schedule visit in ____ Days.   D) Follow-up Advised. Contact patient and Schedule visit in ____ Days. 

## 2015-06-09 ENCOUNTER — Encounter: Payer: Self-pay | Admitting: Family Medicine

## 2017-11-05 NOTE — Progress Notes (Signed)
Chief Complaint  Patient presents with  . Annual Exam    fasting annual exam. No concerns.     Robert Hatfield is a 47 y.o. male who presents for a complete physical.  He has the following concerns:  He complains of bilateral foot pain, has hurt for years.  Shoe inserts have helped a lot.  He has some numbness in both feet on the bottom of the foot, starting at metatarsals, into the toes, involving whole foot, all toes, both sides. Denies weakness. Denies back pain. Denies numbness or tingling in upper extremities.  He was noted to have a small umbilical and ventral hernia at prior physicals. He sometimes notes a slight discomfort, but he is able to exercise regularly (used to be afraid/hesitant, as it caused discomfort) and has noted decrease in size/symptoms since losing weight in the last few months. He notices just slight pressure. Only rare discomfort/twinge at umbilicus.  He used to see Dr. Toy Care. No longer takes Zoloft (got off in 2015-16). No recurrent problems with depression.  He reports that he "Woke up one day ready to quit drinking."  Hasn't had any alcohol since 05/21/17.   He reports he had been drinking 12-15 beers daily "for a long time".  Never got vasectomy (mother passed away the day prior to scheduled surgery). He is still interested in getting this.  Mildly elevated cholesterol in the past. He eats red meat at least 2-3x/week, +cheese; 6 eggs/week. Sweet tooth since quitting drinking, more ice cream recently. He is fasting for labs today.  Last lipids:  Lab Results  Component Value Date   CHOL 224 (H) 05/30/2014   HDL 58 05/30/2014   LDLCALC 138 (H) 05/30/2014   TRIG 141 05/30/2014   CHOLHDL 3.9 05/30/2014    Immunization History  Administered Date(s) Administered  . Influenza,inj,Quad PF,6+ Mos 05/30/2014  . Td 10/08/2000  . Tdap 04/09/2012   Last colonoscopy: never Last PSA: 05/2014 Dentist: twice yearly Ophtho: never. got reading glasses Exercise: walks 2  days/week (2.5-3 miles), and exercise routine 3x/week (TRX and home exercises)  Past Medical History:  Diagnosis Date  . Acne vulgaris    s/p accutane treatment  . 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 Nelva Bush)  . Depression 2000-2002, 2015   treated with Zoloft and Klonopin x 1-2 yrs; recurrent in 2015  . Pure hyperglyceridemia     Past Surgical History:  Procedure Laterality Date  . arthroscopic knee    . HUMERUS FRACTURE SURGERY  age 18   R arm  . KNEE ARTHROSCOPY Bilateral in his 34s  . tennis elbow repair     R arm  . TONSILLECTOMY  child    Social History   Socioeconomic History  . Marital status: Married    Spouse name: Not on file  . Number of children: 2  . Years of education: Not on file  . Highest education level: Not on file  Occupational History  . Occupation: Statistician: [employer]  . Financial resource strain: Not on file  . Food insecurity:    Worry: Not on file    Inability: Not on file  . Transportation needs:    Medical: Not on file    Non-medical: Not on file  Tobacco Use  . Smoking status: Former Smoker    Last attempt to quit: 05/21/2003    Years since quitting: 14.4  . Smokeless tobacco: Never Used  Substance  and Sexual Activity  . Alcohol use: Not Currently    Alcohol/week: 0.0 oz    Frequency: Never    Comment: 0 per day since Jan 2,2019 (12-15 beers/d for a long time)  . Drug use: Yes    Types: Marijuana    Comment: occasionally  . Sexual activity: Yes    Partners: Female  Lifestyle  . Physical activity:    Days per week: Not on file    Minutes per session: Not on file  . Stress: Not on file  Relationships  . Social connections:    Talks on phone: Not on file    Gets together: Not on file    Attends religious service: Not on file    Active member of club or organization: Not on file    Attends meetings of clubs or organizations: Not on file    Relationship status: Not on  file  . Intimate partner violence:    Fear of current or ex partner: Not on file    Emotionally abused: Not on file    Physically abused: Not on file    Forced sexual activity: Not on file  Other Topics Concern  . Not on file  Social History Narrative   Lives with wife, 2 sons. Builds apartment complexes    Family History  Problem Relation Age of Onset  . Depression Mother   . Cancer Mother        lung cancer (smoker)  . Alcoholism Mother   . Hypertension Father   . Cancer Father        prostate cancer, 62's  . AAA (abdominal aortic aneurysm) Father        repaired  . Kidney disease Father        s/p AAA repair; on dialysis  . Depression Brother   . Cancer Paternal Grandmother        breast cancer  . Diabetes Neg Hx   . Heart disease Neg Hx     Outpatient Encounter Medications as of 11/06/2017  Medication Sig Note  . Vitamin D, Ergocalciferol, (DRISDOL) 50000 units CAPS capsule Take 1 capsule (50,000 Units total) by mouth every 7 (seven) days.   . [DISCONTINUED] ALPRAZolam (XANAX) 0.5 MG tablet Take 0.5-1 tablets (0.25-0.5 mg total) by mouth 3 (three) times daily as needed for sleep or anxiety. 05/30/2014: Takes prn; rx'd by Dr. Toy Care  . [DISCONTINUED] Probiotic Product (PROBIOTIC DAILY PO) Take 1 tablet by mouth daily.    No facility-administered encounter medications on file as of 11/06/2017.     No Known Allergies  ROS: The patient denies anorexia, fever, headaches,vision loss, decreased hearing, ear pain, hoarseness, chest pain, palpitations, dizziness, syncope, dyspnea on exertion, cough, swelling, nausea, vomiting, diarrhea, constipation, abdominal pain, melena, hematochezia, indigestion/heartburn, hematuria, incontinence, erectile dysfunction, nocturia, weakened urine stream, dysuria, genital lesions, tremor, suspicious skin lesions, depression, anxiety, abnormal bleeding/bruising, or enlarged lymph nodes. Some sinus congestion with season changes/allergies. More in  the fall than the spring, nothing recently. No further neck pain, numbness or tingling in upper extremities.  Rare shoulder ache (right). Numbness in both feet per HPI.  Some rare pressure noted from hernia, no discomfort. +weight loss--since quitting drinking alcohol in January, per pt.   PHYSICAL EXAM:  BP 112/80   Pulse 72   Ht 6' (1.829 m)   Wt 191 lb (86.6 kg)   BMI 25.90 kg/m   Wt Readings from Last 3 Encounters:  11/06/17 191 lb (86.6 kg)  05/30/14 207 lb (93.9  kg)  12/15/13 211 lb (95.7 kg)    General Appearance:   Alert, cooperative, no distress, appears stated age  Head:   Normocephalic, without obvious abnormality, atraumatic  Eyes:   PERRL, conjunctiva/corneas clear, EOM's intact, fundi benign  Ears:   Normal TM's and external ear canals  Nose:  Nares normal, mucosa normal, no drainage or sinus tenderness  Throat:  Lips, mucosa, and tongue normal; teeth and gums normal  Neck:  Supple, no lymphadenopathy; thyroid: noenlargement/ tenderness/nodules; no carotid bruit or JVD  Back:  Spine nontender, no curvature, ROM normal, no CVA tenderness  Lungs:   Clear to auscultation bilaterally without wheezes, rales or ronchi; respirations unlabored  Chest Wall:   No tenderness or deformity  Heart:   Regular rate and rhythm, S1 and S2 normal, no murmur, rub  or gallop  Breast Exam:   No chest wall tenderness, masses or gynecomastia  Abdomen:   Soft, non-tender, nondistended, normoactive bowel sounds,   no masses, no hepatosplenomegaly. Small ventral hernia vs diastasis recti, nontender. Small umbilical hernia, easily reducible, nontender. Just slight discomfort upon palpation of the defect at umbilicus  Genitalia:   Normal male external genitalia without lesions. Testicles without masses. No inguinal hernias.  Rectal:   Normal sphincter tone, no masses or tenderness; guaiac negative stool. Prostate smooth, no  nodules, not enlarged.  Extremities:  No clubbing, cyanosis or edema  Pulses:  2+ and symmetric all extremities  Skin:  Skin color, texture, turgor normal, no rashes or lesions.  Lymph nodes:  Cervical, supraclavicular, and axillary nodes normal  Neurologic:  CNII-XII intact, normal strength and gait; DTRs symmetric. He had decreased sensation (not complete loss, but subjectively less) to monofilament at all toes, normal at metatarsal heads     Psych: Normal mood, affect, hygiene and grooming.   ASSESSMENT/PLAN:  Annual physical exam - Plan: POCT Urinalysis DIP (Proadvantage Device), Visual acuity screening, Lipid panel, Comprehensive metabolic panel, CBC with Differential/Platelet, VITAMIN D 25 Hydroxy (Vit-D Deficiency, Fractures), TSH, PSA, Vitamin K27  Umbilical hernia without obstruction or gangrene - very small; reviewed sx for which immediate care should be sought  Ventral hernia without obstruction or gangrene - vs diastasis recti.  Less since losing weight.  nontender  Pure hypercholesterolemia - due for recheck. Reviewed lowfat, low cholesterol diet - Plan: Lipid panel  Family history of prostate cancer - Plan: PSA  Screening for prostate cancer - Plan: PSA  Numbness in feet - discussed Ddx; suspect poss related to alcohol, B12. r/o diabetes. If not improving, may need further eval, poss EMG/NCV.  - Plan: TSH, Vitamin B12  Paresthesia of both feet - Plan: TSH, Vitamin B12  Vitamin D deficiency - Plan: Vitamin D, Ergocalciferol, (DRISDOL) 50000 units CAPS capsule  (Vit D deficiency noted after labs back, done for screening).   c-met, CBC, lipids, TSH, Vit D and PSA B12  Discussed PSA screening (risks/benefits). Recommended at least 30 minutes of aerobic activity at least 5 days/week; proper sunscreen use reviewed; healthy diet and alcohol recommendations (less than or equal to 2 drinks/day) reviewed; regular seatbelt use; changing  batteries in smoke detectors. Self-testicular exams. Immunization recommendations discussed--yearly flu shots recommended Colonoscopy recommendations reviewed--age 37, sooner if guidelines change.  He can contact [facility] to schedule vasectomy if still interested  F/u 1 year for CPE, sooner prn, based on labs, and if persistent/wosening peripheral neuropathy.

## 2017-11-06 ENCOUNTER — Encounter: Payer: Self-pay | Admitting: Family Medicine

## 2017-11-06 ENCOUNTER — Ambulatory Visit: Payer: Commercial Managed Care - PPO | Admitting: Family Medicine

## 2017-11-06 VITALS — BP 112/80 | HR 72 | Ht 72.0 in | Wt 191.0 lb

## 2017-11-06 DIAGNOSIS — R200 Anesthesia of skin: Secondary | ICD-10-CM | POA: Diagnosis not present

## 2017-11-06 DIAGNOSIS — Z8042 Family history of malignant neoplasm of prostate: Secondary | ICD-10-CM

## 2017-11-06 DIAGNOSIS — Z0000 Encounter for general adult medical examination without abnormal findings: Secondary | ICD-10-CM

## 2017-11-06 DIAGNOSIS — K439 Ventral hernia without obstruction or gangrene: Secondary | ICD-10-CM

## 2017-11-06 DIAGNOSIS — R202 Paresthesia of skin: Secondary | ICD-10-CM

## 2017-11-06 DIAGNOSIS — K429 Umbilical hernia without obstruction or gangrene: Secondary | ICD-10-CM | POA: Diagnosis not present

## 2017-11-06 DIAGNOSIS — E559 Vitamin D deficiency, unspecified: Secondary | ICD-10-CM

## 2017-11-06 DIAGNOSIS — Z125 Encounter for screening for malignant neoplasm of prostate: Secondary | ICD-10-CM

## 2017-11-06 DIAGNOSIS — E7800 Pure hypercholesterolemia, unspecified: Secondary | ICD-10-CM | POA: Diagnosis not present

## 2017-11-06 LAB — POCT URINALYSIS DIP (PROADVANTAGE DEVICE)
Bilirubin, UA: NEGATIVE
Glucose, UA: NEGATIVE mg/dL
Ketones, POC UA: NEGATIVE mg/dL
Leukocytes, UA: NEGATIVE
Nitrite, UA: NEGATIVE
PH UA: 6 (ref 5.0–8.0)
Protein Ur, POC: NEGATIVE mg/dL
RBC UA: NEGATIVE
SPECIFIC GRAVITY, URINE: 1.025
Urobilinogen, Ur: NEGATIVE

## 2017-11-06 NOTE — Patient Instructions (Addendum)
  HEALTH MAINTENANCE RECOMMENDATIONS:  It is recommended that you get at least 30 minutes of aerobic exercise at least 5 days/week (for weight loss, you may need as much as 60-90 minutes). This can be any activity that gets your heart rate up. This can be divided in 10-15 minute intervals if needed, but try and build up your endurance at least once a week.  Weight bearing exercise is also recommended twice weekly.  Eat a healthy diet with lots of vegetables, fruits and fiber.  "Colorful" foods have a lot of vitamins (ie green vegetables, tomatoes, red peppers, etc).  Limit sweet tea, regular sodas and alcoholic beverages, all of which has a lot of calories and sugar.  Up to 2 alcoholic drinks daily may be beneficial for men (unless trying to lose weight, watch sugars).  Drink a lot of water.  Sunscreen of at least SPF 30 should be used on all sun-exposed parts of the skin when outside between the hours of 10 am and 4 pm (not just when at beach or pool, but even with exercise, golf, tennis, and yard work!)  Use a sunscreen that says "broad spectrum" so it covers both UVA and UVB rays, and make sure to reapply every 1-2 hours.  Remember to change the batteries in your smoke detectors when changing your clock times in the spring and fall.  Use your seat belt every time you are in a car, and please drive safely and not be distracted with cell phones and texting while driving.  [facility] is where we previously referred you for vasectomy. You should be able to call on your own. If anything is required for insurance (referral), just let us know.  Consider seeing podiatrist for ongoing foot numbness, vs potentially needing nerve conduction studies.  We are checking for underlying abnormalities in your blood tests.

## 2017-11-07 LAB — COMPREHENSIVE METABOLIC PANEL
ALBUMIN: 4.5 g/dL (ref 3.5–5.5)
ALT: 26 IU/L (ref 0–44)
AST: 21 IU/L (ref 0–40)
Albumin/Globulin Ratio: 1.7 (ref 1.2–2.2)
Alkaline Phosphatase: 68 IU/L (ref 39–117)
BILIRUBIN TOTAL: 0.6 mg/dL (ref 0.0–1.2)
BUN / CREAT RATIO: 11 (ref 9–20)
BUN: 11 mg/dL (ref 6–24)
CHLORIDE: 102 mmol/L (ref 96–106)
CO2: 26 mmol/L (ref 20–29)
Calcium: 9.8 mg/dL (ref 8.7–10.2)
Creatinine, Ser: 0.96 mg/dL (ref 0.76–1.27)
GFR calc non Af Amer: 93 mL/min/{1.73_m2} (ref >59)
GFR, EST AFRICAN AMERICAN: 108 mL/min/{1.73_m2} (ref >59)
GLOBULIN, TOTAL: 2.6 g/dL (ref 1.5–4.5)
GLUCOSE: 94 mg/dL (ref 65–99)
Potassium: 4.3 mmol/L (ref 3.5–5.2)
Sodium: 143 mmol/L (ref 134–144)
Total Protein: 7.1 g/dL (ref 6.0–8.5)

## 2017-11-07 LAB — CBC WITH DIFFERENTIAL/PLATELET
BASOS ABS: 0 10*3/uL (ref 0.0–0.2)
Basos: 0 %
EOS (ABSOLUTE): 0.1 10*3/uL (ref 0.0–0.4)
EOS: 2 %
HEMATOCRIT: 45.5 % (ref 37.5–51.0)
HEMOGLOBIN: 15.4 g/dL (ref 13.0–17.7)
Immature Grans (Abs): 0 10*3/uL (ref 0.0–0.1)
Immature Granulocytes: 0 %
LYMPHS ABS: 2 10*3/uL (ref 0.7–3.1)
Lymphs: 40 %
MCH: 30.1 pg (ref 26.6–33.0)
MCHC: 33.8 g/dL (ref 31.5–35.7)
MCV: 89 fL (ref 79–97)
MONOCYTES: 8 %
Monocytes Absolute: 0.4 10*3/uL (ref 0.1–0.9)
NEUTROS ABS: 2.5 10*3/uL (ref 1.4–7.0)
Neutrophils: 50 %
Platelets: 271 10*3/uL (ref 150–450)
RBC: 5.11 x10E6/uL (ref 4.14–5.80)
RDW: 14.5 % (ref 12.3–15.4)
WBC: 5 10*3/uL (ref 3.4–10.8)

## 2017-11-07 LAB — LIPID PANEL
CHOLESTEROL TOTAL: 204 mg/dL — AB (ref 100–199)
Chol/HDL Ratio: 3.9 ratio (ref 0.0–5.0)
HDL: 52 mg/dL (ref >39)
LDL Calculated: 130 mg/dL — ABNORMAL HIGH (ref 0–99)
Triglycerides: 110 mg/dL (ref 0–149)
VLDL Cholesterol Cal: 22 mg/dL (ref 5–40)

## 2017-11-07 LAB — PSA: Prostate Specific Ag, Serum: 1.3 ng/mL (ref 0.0–4.0)

## 2017-11-07 LAB — VITAMIN B12: Vitamin B-12: 377 pg/mL (ref 232–1245)

## 2017-11-07 LAB — VITAMIN D 25 HYDROXY (VIT D DEFICIENCY, FRACTURES): Vit D, 25-Hydroxy: 24.5 ng/mL — ABNORMAL LOW (ref 30.0–100.0)

## 2017-11-07 LAB — TSH: TSH: 1.73 u[IU]/mL (ref 0.450–4.500)

## 2017-11-07 MED ORDER — VITAMIN D (ERGOCALCIFEROL) 1.25 MG (50000 UNIT) PO CAPS
50000.0000 [IU] | ORAL_CAPSULE | ORAL | 0 refills | Status: DC
Start: 2017-11-07 — End: 2018-02-24

## 2018-02-02 ENCOUNTER — Encounter: Payer: Self-pay | Admitting: Family Medicine

## 2018-02-02 MED ORDER — ALPRAZOLAM 0.5 MG PO TABS: 0.2500 mg | ORAL_TABLET | Freq: Three times a day (TID) | ORAL | 0 refills | Status: DC | PRN

## 2018-02-23 ENCOUNTER — Other Ambulatory Visit: Payer: Self-pay | Admitting: Family Medicine

## 2018-02-23 ENCOUNTER — Other Ambulatory Visit: Payer: Self-pay

## 2018-02-23 NOTE — Telephone Encounter (Signed)
Is this okay to refill? I did call and ask if he actuallu out in for this or is this was an auto refill-he states he did request.

## 2018-02-23 NOTE — Telephone Encounter (Signed)
Spoke with patient and he will come in tomorrow, does not need the refill then. And he is not seeing a therapist currently.

## 2018-02-23 NOTE — Telephone Encounter (Signed)
Pt made appt for tomorrow, and stated refill wasn't needed before then

## 2018-02-23 NOTE — Progress Notes (Signed)
Chief Complaint  Patient presents with  . Consult    discuss medication.   . Other    never refilled vitamin D rx and not sure if he even took the first 4. He will go home and look for rx bottle to re-start.     Patient was asked to schedule appointment after refill request came in for xanax yesterday. He was prescribed #30 on 9/16, after he learned that his wife had recurrent/metastatic melanoma.  He previously was under the care of Dr. Toy Care for his anxiety. Took Zoloft in the past Off alcohol x 9-10 months, remains abstinent.  She is getting immunotherapy, q3wks. She had been in a lot of pain (that's how recurrence was discovered, during eval for hip pain).  She received radiation, and pain is much better. He's good when she is good, but some rough patches. It was worse when she was in pain.  He doesn't feel depressed, mostly anxious.  He reports that he over-complicates things, gets ahead of himself, "paralyzes" him, esp when gets behind at work.  He also can get edgy, wound up.  Xanax levels him out, keeps him rational, helps him sleep (mind races).  Initially took TID, then 1/2 in am (wants to not overreact to boys getting up in the morning).  1/2-1 mid-day, whole at bedtime. No sedation or side effects   PMH, PSH, SH reviewed  Outpatient Encounter Medications as of 02/24/2018  Medication Sig  . ALPRAZolam (XANAX) 0.5 MG tablet Take 0.5-1 tablets (0.25-0.5 mg total) by mouth 3 (three) times daily as needed for anxiety.  . [DISCONTINUED] ALPRAZolam (XANAX) 0.5 MG tablet Take 0.5-1 tablets (0.25-0.5 mg total) by mouth 3 (three) times daily as needed for anxiety.  . sertraline (ZOLOFT) 50 MG tablet Start 1/2 tablet by mouth at bedtime. Increase after a week to the full tablet.  . [DISCONTINUED] Vitamin D, Ergocalciferol, (DRISDOL) 50000 units CAPS capsule Take 1 capsule (50,000 Units total) by mouth every 7 (seven) days.   No facility-administered encounter medications on file as of  02/24/2018.    (not taking sertraline prior to visit)  No Known Allergies  ROS: no fever, chills, chest pain, shortness of breath, URI symptoms or other complaints except anxiety as noted in HPI   PHYSICAL EXAM:  BP 110/68   Pulse 60   Ht 6' (1.829 m)   Wt 187 lb 3.2 oz (84.9 kg)   BMI 25.39 kg/m   Wt Readings from Last 3 Encounters:  02/24/18 187 lb 3.2 oz (84.9 kg)  11/06/17 191 lb (86.6 kg)  05/30/14 207 lb (93.9 kg)    Well appearing, pleasant male, in no distress Normal hygiene, grooming, eye contact, speech. Good insight.  GAD-7 score of 16 See epic for full screen   ASSESSMENT/PLAN:  Acute adjustment disorder with anxiety - start sertraline, risks/SE reviewed. refill xanax--risks reviewed, encouraged to use prn, will lessen as sertraline start to help. - Plan: sertraline (ZOLOFT) 50 MG tablet, ALPRAZolam (XANAX) 0.5 MG tablet  Need for influenza vaccination - Plan: Flu Vaccine QUAD 6+ mos PF IM (Fluarix Quad PF)  Encouraged counseling, mindfulness/meditation, self-care, other techniques to help with anxiety (other than xanax). Responsive to these techniques, has been doing some morning meditation, encouraged evening as well.  F/u 6 weeks, sooner prn  25-30 min visit, more than 1/2 spent counseling

## 2018-02-23 NOTE — Telephone Encounter (Signed)
Pt is requesting a refill on the pending medication to be sent to [facility]

## 2018-02-23 NOTE — Telephone Encounter (Signed)
Last filled #30 on 9/16. He stated he was going to make appt, but never did. He is using more than 1 daily.  Not sure what is going on with wife's illness, but he needs to be seen to discuss medications. Okay to refill #5-10 only, if needed, to get him to appointment, depending on when he can get here for visit. Is he seeing a therapist/counselor?

## 2018-02-24 ENCOUNTER — Encounter: Payer: Self-pay | Admitting: Family Medicine

## 2018-02-24 ENCOUNTER — Ambulatory Visit: Payer: Commercial Managed Care - PPO | Admitting: Family Medicine

## 2018-02-24 VITALS — BP 110/68 | HR 60 | Ht 72.0 in | Wt 187.2 lb

## 2018-02-24 DIAGNOSIS — F4322 Adjustment disorder with anxiety: Secondary | ICD-10-CM

## 2018-02-24 DIAGNOSIS — Z23 Encounter for immunization: Secondary | ICD-10-CM

## 2018-02-24 MED ORDER — ALPRAZOLAM 0.5 MG PO TABS: 0.2500 mg | ORAL_TABLET | Freq: Three times a day (TID) | ORAL | 0 refills | Status: DC | PRN

## 2018-02-24 MED ORDER — SERTRALINE HCL 50 MG PO TABS: ORAL_TABLET | ORAL | 1 refills | Status: DC

## 2018-02-24 NOTE — Patient Instructions (Addendum)
Continue to work on stress reduction techniques, breathing, mindfulness, meditation. Start the sertraling 1/2 tablet at bedtime.  Increase to a full tablet after a week, if tolerating.  Expect the possibility of slightly worsened anxiety, but ultimately know that this medication will help treat anxiety.  Use the xanax if/when needed, with the goal being decreased use as the zoloft kicks in. Likely you may need to further increase the zoloft dose to 100mg .  I would wait at least 2-3 weeks to get an idea if you have had any improvement.  It takes 4-6 weeks to see the full effect, but can increase sooner if only seeing minimal improvement and no side effects.  Please contact me in the next few weeks and let me know how you're doing.

## 2018-02-26 ENCOUNTER — Encounter: Payer: Self-pay | Admitting: Family Medicine

## 2018-04-01 ENCOUNTER — Ambulatory Visit: Payer: Commercial Managed Care - PPO | Admitting: Family Medicine

## 2018-04-01 NOTE — Progress Notes (Deleted)
  Patient presents for 4-5 week follow-up on anxiety.  He had GAD score of 16 prior to starting medications. He was started on sertraline 50mg , at 1/2 tablet for the first week, then increased to the full  Side effects Using alprazolam   At his last visit, he reported: He doesn't feel depressed, mostly anxious.  He reports that he over-complicates things, gets ahead of himself, "paralyzes" him, esp when gets behind at work.  He also can get edgy, wound up.  Xanax levels him out, keeps him rational, helps him sleep (mind races). Initially took TID, then 1/2 in am (wants to not overreact to boys getting up in the morning).  1/2-1 mid-day, whole at bedtime. No sedation or side effects    ROS: no fever, chills, chest pain, shortness of breath, URI symptoms or other complaints except anxiety as noted in HPI   PHYSICAL EXAM:  Wt Readings from Last 3 Encounters:  02/24/18 187 lb 3.2 oz (84.9 kg)  11/06/17 191 lb (86.6 kg)  05/30/14 207 lb (93.9 kg)    Well appearing, pleasant male, in no distress Normal hygiene, grooming, eye contact, speech. Good insight.  GAD-7 score of  See epic for full screen

## 2018-04-02 ENCOUNTER — Encounter: Payer: Commercial Managed Care - PPO | Admitting: Family Medicine

## 2018-04-05 NOTE — Progress Notes (Signed)
Chief Complaint  Patient presents with  . Anxiety    med check, no new concerns. Did not finish his full 12 weeks of vitamin d, really in unsure of how many he took-would like to know what he should do at this point.     Patient presents for follow-up on anxiety.  He had GAD-7 score of 16 prior to starting medications. He was started on sertraline 50mg , at 1/2 tablet for the first week, then increased to the full pill.  He is tolerating this without side effects. "I don't know that I can tell a difference", nothing dramatic. Maybe feels a little more engaged.  Still "wants to take alprazolam". Taking 1/2 twice daily or 1 full tablet if very anxious.  Needing it daily, requesting refill today.  Work has gotten more intense, and will stay that way for a while. Has to stay "on" much more, having more on his plate--feels pressure, gets frustrated. Doesn't have time to "decompress". Uses "headspace" app in the mornings, but not always.  Has time to relax mainly in the morning  (over coffee/TV/paper). Busy with his boys schedules in the evenings and on the weekends.  49 yo father on dialysis, PVD, toes amputated, needs foot amputation.  Siblings are local to help. They are trying to not involve him much in that, since he has a lot on his plate.  Hasn't called yet about counseling.  At his last visit, he reported: He doesn't feel depressed, mostly anxious. He reports that he over-complicates things, gets ahead of himself, "paralyzes" him, esp when gets behind at work.He also can get edgy, wound up. Xanax levels him out, keeps him rational, helps him sleep (mind races). Initially took TID, then 1/2 in am (wants to not overreact to boys getting up in the morning). 1/2-1 mid-day, whole at bedtime. No sedation or side effects  Vitamin D was prescribed in June, admits that he didn't finish the full course. He forgot about it, and hasn't taken any OTC vitamin D3. Asking what he should do  now.  PMH, PSH, SH reviewed  Meds: zoloft 50mg , alprazolam prn  No Known Allergies  ROS: no fever, chills, chest pain, shortness of breath, URI symptoms, GI symptoms or other complaints except anxiety as noted in HPI.    PHYSICAL EXAM:  BP 122/70   Pulse 68   Ht 6' (1.829 m)   Wt 183 lb 12.8 oz (83.4 kg)   BMI 24.93 kg/m   Wt Readings from Last 3 Encounters:  04/06/18 183 lb 12.8 oz (83.4 kg)  02/24/18 187 lb 3.2 oz (84.9 kg)  11/06/17 191 lb (86.6 kg)    Well appearing, pleasant male, in no distress Normal hygiene, grooming, eye contact, speech. Good insight. Neck: no lymphadenopathy or thyromegaly Heart: regular rate and rhythm Lungs: clear bilaterally Extremities: No edema Neuro: alert and oriented, cranial nerves intact, normal gait.  GAD-7 score of 10 (down from 16) See epic for full screen    ASSESSMENT/PLAN:  Vitamin D deficiency - encouraged to take 2000 IU through the winter, then decrease to 1000 IU daily. Recheck at CPE, due in June.  Acute adjustment disorder with anxiety - slight improvement noted on GAD-7 minimal noted per pt.  Titrate up to 100mg . Hope to lessen use of alprazolam as SSRI takes effect. Needs counseling - Plan: sertraline (ZOLOFT) 100 MG tablet, ALPRAZolam (XANAX) 0.5 MG tablet   Risks/side effects of meds reviewed. Goal is to decrease xanax use, have anxiety controlled with SSRI.  If not getting adequate response to zoloft, can further titrate up, or consider adding Buspar. Needs counseling--recommendations/names given. Continue to work on relaxation/stress reduction.   Increase the zoloft to 1.5 tablets for the next 3-4 days.  If tolerating it, then increase to 2 pills until you pick up the new prescription for 100mg  tablets. I highly encourage that you get involved with counseling. Please call to schedule an appointment. We discussed [facility] (on [address], near [place]), [facility], and  others. Let us know if you need assistance.  Laroy Apple at [facility] is the person we spoke of possibly for the boys.  Start taking Vitamin D3 daily, and plan to take this longterm. I recommend taking 2000 IU daily through the winter, then you can decrease to just 1000 IU daily, until we recheck it at your physical.   Schedule CPE for 10/2018.  25 min visit, more than 1/2 spent counseling.

## 2018-04-06 ENCOUNTER — Encounter: Payer: Self-pay | Admitting: Family Medicine

## 2018-04-06 ENCOUNTER — Ambulatory Visit: Payer: Commercial Managed Care - PPO | Admitting: Family Medicine

## 2018-04-06 VITALS — BP 122/70 | HR 68 | Ht 72.0 in | Wt 183.8 lb

## 2018-04-06 DIAGNOSIS — F4322 Adjustment disorder with anxiety: Secondary | ICD-10-CM

## 2018-04-06 DIAGNOSIS — E559 Vitamin D deficiency, unspecified: Secondary | ICD-10-CM

## 2018-04-06 MED ORDER — SERTRALINE HCL 100 MG PO TABS: 100.0000 mg | ORAL_TABLET | Freq: Every day | ORAL | 1 refills | Status: DC

## 2018-04-06 MED ORDER — ALPRAZOLAM 0.5 MG PO TABS: 0.2500 mg | ORAL_TABLET | Freq: Three times a day (TID) | ORAL | 0 refills | Status: DC | PRN

## 2018-04-06 NOTE — Patient Instructions (Signed)
  Increase the zoloft to 1.5 tablets for the next 3-4 days.  If tolerating it, then increase to 2 pills until you pick up the new prescription for 100mg  tablets. I highly encourage that you get involved with counseling. Please call to schedule an appointment. We discussed [facility] (on [address], near [place]), [facility], and others. Let us know if you need assistance.  Laroy Apple at [facility] is the person we spoke of possibly for the boys.  Start taking Vitamin D3 daily, and plan to take this longterm. I recommend taking 2000 IU daily through the winter, then you can decrease to just 1000 IU daily, until we recheck it at your physical.

## 2018-05-04 ENCOUNTER — Other Ambulatory Visit: Payer: Self-pay | Admitting: Family Medicine

## 2018-05-04 DIAGNOSIS — F4322 Adjustment disorder with anxiety: Secondary | ICD-10-CM

## 2018-05-04 NOTE — Telephone Encounter (Signed)
Is this auto-refill, or does patient really need?  If he really needs, okay to fill #30, no add'l refill (he has f/u on 12/26, but if he needs it prior to that visit, okay to fill)

## 2018-05-04 NOTE — Telephone Encounter (Signed)
[facility] is requesting to fill pt alprazolam. Please advise. [place]

## 2018-05-05 ENCOUNTER — Telehealth: Payer: Self-pay

## 2018-05-05 NOTE — Telephone Encounter (Signed)
Called pt and waiting call back. [place]

## 2018-05-05 NOTE — Telephone Encounter (Signed)
Call pt to see if he needed his xanax fill. Pt has appt 05-14-18. No answer lvm . Per Dr. Tomi Bamberger ok to fill if pt needs it. [place]

## 2018-05-06 NOTE — Telephone Encounter (Signed)
Called pt a few times with no response. Pt has appt 05-14-18. [place]

## 2018-05-11 NOTE — Progress Notes (Signed)
Chief Complaint  Patient presents with  . Follow-up    patient reports that he stopped taking sertaline about 2 weeks ago. Here to discuss, felt like he wasn't getting out of it what he should except side effects.     Patient presents for follow-up on anxiety. He admits that he stopped taking the sertraline about 2 weeks ago. He had only bee non the 100mg  dose for about 10-14 days.  He reports that he wasn't noticing much benefit, and had some side effects. He reported sexual side effects, related to ejaculation. Denies decreased libido.   He got upset that his xanax wasn't refilled.  Apparently he called [facility] for a refill on the Friday before a stressful Monday 12/16 when wife would be getting reports back regarding her response to treatment. He wanted xanax for that appointment.  It looks like the refill request didn't come through to [place] until that Monday not on the Friday he reports he requested it through his pharmacy.  Once that visit passed, and he got good news, it wasn't that crucial, so he didn't call back, waited for today to get refill.   Immunotherapy is working well. 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  She is to continue this treatment for 2 years.  It was great to see that she was responding (they were hoping it just hadn't spread further), and 5 year prognosis went from 50% up to 90% with these results, so they are very please.  She continues to feel flu-like, very tired with these treatments.  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   At his last visit last month, his zoloft dose was increased from 50 to 100mg . His GAD-7 score had improved from 16 to 10 at that time. At that time (on 50mg ) he denied side effects.  He reports that he is some chronic stress/anxiety, but also has situational  things, things that come up (at work, with wife, appointments, etc).   He likes that xanax works quickly, when he needs it. He has been taking 1 in the morning, and sometimes one at night if he can't shut his mind down.  He admits that since the time change, and it is dark in the morning, he hasn't been doing Mindspace. He has not yet called to schedule counseling.  PMH, PSH, SH reviewed  Outpatient Encounter Medications as of 05/14/2018  Medication Sig Note  . ALPRAZolam (XANAX) 0.5 MG tablet Take 0.5-1 tablets (0.25-0.5 mg total) by mouth 3 (three) times daily as needed for anxiety.   . busPIRone (BUSPAR) 15 MG tablet Start at 1/2 tablet twice daily, and titrate up to full tablet as directed, if needed   . [DISCONTINUED] ALPRAZolam (XANAX) 0.5 MG tablet Take 0.5-1 tablets (0.25-0.5 mg total) by mouth 3 (three) times daily as needed for anxiety. (Patient not taking: Reported on 05/14/2018)   . [DISCONTINUED] sertraline (ZOLOFT) 100 MG tablet Take 1 tablet (100 mg total) by mouth at bedtime. (Patient not taking: Reported on 05/14/2018) 05/14/2018: Stopped taking about 2 weeks ago   No facility-administered encounter medications on file as of 05/14/2018.    (buspar rx'd today not taking prior to visit).  No Known Allergies  ROS:  No fever, URI symptoms, decreased libido, GI complaints.  +anxiety.  +  difficulty with ejaculation while taking zoloft. See HPI    PHYSICAL EXAM:  BP 110/74   Pulse 60   Ht 6' (1.829 m)   Wt 183 lb (83 kg)   BMI 24.82 kg/m   Well appearing, pleasant male, in no distress Normal hygiene, grooming, eye contact, speech. Somewhat irritable in discussing that his refill request wasn't handled in the timely way he wanted (?due to issue with pharmacy contacting our office). Overall, he has full range of affect, normal mood. Normal eye contact and speech. Neuro: alert and oriented, cranial nerves intact, normal gait.   ASSESSMENT/PLAN:   Counseled  extensively regarding risks of xanax, not wanting to use it as a replacement "crutch" for alcohol.  Discussed potential dependence, withdrawal, and reasons for prn use, rather than daily without at least also being on a controlling medication. Discussed that other SSRI's may have fewer side effects with respect to delay in ejaculation and can consider Lexapro. For now, will try Buspar, titrate up to 15mg  BID.  If not effective or not tolerated, then change to Lexapro. We do not need to wait for his 6 week f/u to make this change, if Buspar clearly not helping. Xanax quantity increased to #45 to avoid increased stress regarding the refill (he did understand the delay/question was confirming it was not an auto-refill request, that it was truly needed; reminded him he can send MyChart messages if easier than calling the office back).  F/u 6 weeks, sooner prn.  25 min visit, more than 1/2 spent counseling.   buspar--start at 1/3- 1/2 tablet, and take it twice daily. If you can only tolerate 5 mg (1/3 dose), take that twice daily for 3-5 days, then try increasing to 3 times/day (vs changing back to the 7.5 (1/2 tablet) twice daily). After 3-5 days, if your anxiety isn't responding, you can increase to the full pill in the morning, 1/2 pill at night, and further titrate up to a full pill twice daily within a week, if needed. You may use xanax along with this, if needed. The goal is to help decrease the anxiety throughout the day with the medication, so that xanax is used less often (in times of more significant stress). Try and use the xanax sparingly.  If you cannot tolerate this, or see no improvement at all, the other option would be to try a different SSRI (ie Lexapro) that would have fewer sexual side effects than the Zoloft did. Versus having you see Dr. Toy Care for further medication management.  Please work on some of the relaxation techniques we mentioned, that were helpful in the past (deep  breathing, Mindspace, meditation, etc). And please consider counseling [facility] on [employer]).

## 2018-05-14 ENCOUNTER — Ambulatory Visit: Payer: Commercial Managed Care - PPO | Admitting: Family Medicine

## 2018-05-14 ENCOUNTER — Encounter: Payer: Self-pay | Admitting: Family Medicine

## 2018-05-14 VITALS — BP 110/74 | HR 60 | Ht 72.0 in | Wt 183.0 lb

## 2018-05-14 DIAGNOSIS — F4322 Adjustment disorder with anxiety: Secondary | ICD-10-CM | POA: Diagnosis not present

## 2018-05-14 DIAGNOSIS — F411 Generalized anxiety disorder: Secondary | ICD-10-CM | POA: Diagnosis not present

## 2018-05-14 MED ORDER — BUSPIRONE HCL 15 MG PO TABS: ORAL_TABLET | ORAL | 2 refills | Status: DC

## 2018-05-14 MED ORDER — ALPRAZOLAM 0.5 MG PO TABS: 0.2500 mg | ORAL_TABLET | Freq: Three times a day (TID) | ORAL | 0 refills | Status: DC | PRN

## 2018-05-14 NOTE — Patient Instructions (Signed)
  buspar--start at 1/3- 1/2 tablet, and take it twice daily. If you can only tolerate 5 mg (1/3 dose), take that twice daily for 3-5 days, then try increasing to 3 times/day (vs changing back to the 7.5 (1/2 tablet) twice daily). After 3-5 days, if your anxiety isn't responding, you can increase to the full pill in the morning, 1/2 pill at night, and further titrate up to a full pill twice daily within a week, if needed. You may use xanax along with this, if needed. The goal is to help decrease the anxiety throughout the day with the medication, so that xanax is used less often (in times of more significant stress). Try and use the xanax sparingly.  If you cannot tolerate this, or see no improvement at all, the other option would be to try a different SSRI (ie Lexapro) that would have fewer sexual side effects than the Zoloft did. Versus having you see Dr. Toy Care for further medication management.  Please work on some of the relaxation techniques we mentioned, that were helpful in the past (deep breathing, Mindspace, meditation, etc). And please consider counseling [facility] on [employer]).  If you find that the Buspar isn't helping at all, you can contact us after just  A couple of week (get up to the full dose, if tolerating it), and we can make the change over the phone (to Lexapro) rather than waiting until your 6 week appointment.

## 2018-06-12 ENCOUNTER — Other Ambulatory Visit: Payer: Self-pay | Admitting: Family Medicine

## 2018-06-12 DIAGNOSIS — F4322 Adjustment disorder with anxiety: Secondary | ICD-10-CM

## 2018-06-12 DIAGNOSIS — F411 Generalized anxiety disorder: Secondary | ICD-10-CM

## 2018-06-12 NOTE — Telephone Encounter (Signed)
Last filled #45 on 12/26. Has appt scheduled 2/6 for f/u.  Will send refill

## 2018-06-12 NOTE — Telephone Encounter (Signed)
Is this ok to refill?  

## 2018-06-25 ENCOUNTER — Encounter: Payer: Commercial Managed Care - PPO | Admitting: Family Medicine

## 2018-07-06 ENCOUNTER — Other Ambulatory Visit: Payer: Self-pay | Admitting: Family Medicine

## 2018-07-06 DIAGNOSIS — F411 Generalized anxiety disorder: Secondary | ICD-10-CM

## 2018-07-06 DIAGNOSIS — F4322 Adjustment disorder with anxiety: Secondary | ICD-10-CM

## 2018-07-06 NOTE — Telephone Encounter (Signed)
Left this detailed message on patient's voicemail and denied medication.

## 2018-07-06 NOTE — Telephone Encounter (Signed)
Last filled 1/24 for #45.  He was scheduled for a med check in February (to f/u on his anxiety, as Buspar was started at his December visit). He canceled this, and is now requesting refill too soon.  Needs OV

## 2018-07-06 NOTE — Telephone Encounter (Signed)
Is this okay to refill? 

## 2018-07-07 NOTE — Progress Notes (Signed)
Chief Complaint  Patient presents with  . Anxiety    med check for anxiety.    Patient presents to follow up on anxiety.  He cancelled his scheduled 6 week f/u visit earlier this month (whole family had been sick with flu).  He was started on Buspar at his 12/26 visit, hoping to help his anxiety and decrease his alprazolam use.  He really just wanted to take alprazolam, because it worked well.   At his last visit he was counseled extensively regarding risks of xanax, not wanting to use it as a replacement "crutch" for alcohol. Discussed potential dependence, withdrawal, and reasons for prn use, rather than daily without at least also being on a controlling medication. He had been restarted on sertraline 100mg  (previously taken), but stopped after 2 weeks due to no response and some sexual/ejaculatory side effects. He did not have decreased libido. Discussed that other SSRI's may have fewer side effects with respect to delay in ejaculation and can consider Lexapro.  We decided to start with Buspar, titrate up to 15mg  BID.  If not effective or not tolerated, then change to Lexapro. It was discussed that we did not need to wait for his 6 week f/u to make this change, if Buspar clearly not helping. His Xanax quantity was increased to #45 to avoid increased stress regarding the refill.  We had also discussed following up with Dr. Toy Care for his medication management if this wasn't effective, as he used to be treated by her.  Xanax was refilled for #45 on 05/14/18 and #45 on 06/12/2018.  He requested another refill on 2/17, which was denied (due to being early and not coming for his f/u visit).  He presents today to discuss his anxiety and medications.  He is taking Buspar, and has been taking 1 BID. He notices some improvement, 'in the small things". He ran out a few days ago.   "I'm really bad off right now"-- x 3 weeks. He feels completely overwhelmed and anxious, having a very tough time for the last 3  weeks.  He had a small reprieve when his wife was off treatments (and on prednisone--energy was better, things felt normal)--she is back to chemo treatments now.  His dad is worse, and having to make decisions about hospice.  He got behind at work since his wife has been getting chemo (6 mos), can't seem to catch up.  Feels most stressed and anxious when at work.  He feels like he needs to be taking the xanax TID, but doesn't.  Only takes about 1 and 3/4 pills daily.  Needs it morning and evening, does not need it much on weekends. He is more relaxed (not working), able to exercise.  No time in mornings for meditation anymore.  Since his wife is back in treatment, he is needing to care for the boys  While on the way to the office today, he registered with [facility], and is awaiting message to schedule appointment.  He does feel it would be helpful to talk to someone and "unload"  PMH, PSH, SH reviewed  Meds: alprazolam 0.5mg  as stated above buspar 15mg  BID  No Known Allergies  ROS:  No fever, chills, URI symptoms.  +anxiety, overwhelmed, decreased appetite. No chest pain, shortness of breath, or other concerns, except as noted in HPI.   PHYSICAL EXAM:  BP 128/88   Pulse 72   Ht 6' (1.829 m)   Wt 184 lb 9.6 oz (83.7 kg)  BMI 25.04 kg/m   BP 128/88   Pulse 72   Ht 6' (1.829 m)   Wt 184 lb 9.6 oz (83.7 kg)   BMI 25.04 kg/m   Wt Readings from Last 3 Encounters:  07/08/18 184 lb 9.6 oz (83.7 kg)  05/14/18 183 lb (83 kg)  04/06/18 183 lb 12.8 oz (83.4 kg)   Tearful, anxious male.  Normal hygiene and grooming, eye contact and speech.  +anxious/depressed.  GAD-7 score of 15 (up from 10 on last check) PHQ-9 score of 7  ASSESSMENT/PLAN:  Generalized anxiety disorder - slight improvement with Buspar--increase dose to 1.5 tabs BID; Start lexapro 10mg . Counseling. Schedule w/psych - Plan: busPIRone (BUSPAR) 15 MG tablet, escitalopram (LEXAPRO) 10 MG tablet, ALPRAZolam (XANAX)  0.5 MG tablet  Acute adjustment disorder with anxiety - Plan: ALPRAZolam (XANAX) 0.5 MG tablet  Significant worsening in anxiety since last visit, pt barely able to keep it together during visit.  Increasing buspar alone will NOT get him better.  Counseling is important--discussed getting in as soon as possible (check other sites for therapist of [facility] can't get him in quickly).  Start Lexapro--should help with depressive sx and anxiety, and should have less SE (delayed ejaculation) than the sertraline did. Risks/SE reviewed in detail. Cont alprazolam as needed, pt well aware of risks and need for the other meds now. Encouraged him to schedule with psych, so appt is made in case he is not improving with these changes.  Can further titrate up buspar in future, if needed, but increasing now just to help SOME, while waiting for lexapro to take effect.  25 min visit, all spent counseling.   We are increasing your buspar to 1.5 tablets twice daily--I'm sending in a new prescription instead of you picking up the refill, so you get the larger quantity (to last a full month). We are also starting you on escitalopram (lexapro).  Start at 1/2 tablet once daily.  After 1 week, if tolerating, increase to the full tablet and stay there. Side effects include headache, nausea, diarrhea, sexual side effects, and potentially increased anxiety initially which is why we start at the 1/2 tablet. Use the alprazolam if needed to get you through this part.  Call [facility] if you don't hear from them, to schedule appointment. Be sure to ask about other offices with therapists if they don't have fast availability.  Please schedule an appointment with a psychiatrist--it may take a while to get an appointment, so having it scheduled now will help (can always cancel if you're doing better and it isn't needed).

## 2018-07-08 ENCOUNTER — Encounter: Payer: Self-pay | Admitting: Family Medicine

## 2018-07-08 ENCOUNTER — Ambulatory Visit: Payer: Commercial Managed Care - PPO | Admitting: Family Medicine

## 2018-07-08 DIAGNOSIS — F4322 Adjustment disorder with anxiety: Secondary | ICD-10-CM

## 2018-07-08 DIAGNOSIS — F411 Generalized anxiety disorder: Secondary | ICD-10-CM

## 2018-07-08 MED ORDER — ESCITALOPRAM OXALATE 10 MG PO TABS: ORAL_TABLET | ORAL | 1 refills | Status: DC

## 2018-07-08 MED ORDER — BUSPIRONE HCL 15 MG PO TABS: ORAL_TABLET | ORAL | 1 refills | Status: DC

## 2018-07-08 MED ORDER — ALPRAZOLAM 0.5 MG PO TABS: ORAL_TABLET | ORAL | 0 refills | Status: DC

## 2018-07-08 NOTE — Patient Instructions (Signed)
  We are increasing your buspar to 1.5 tablets twice daily--I'm sending in a new prescription instead of you picking up the refill, so you get the larger quantity (to last a full month). We are also starting you on escitalopram (lexapro).  Start at 1/2 tablet once daily.  After 1 week, if tolerating, increase to the full tablet and stay there. Side effects include headache, nausea, diarrhea, sexual side effects, and potentially increased anxiety initially which is why we start at the 1/2 tablet. Use the alprazolam if needed to get you through this part.  Call [facility] if you don't hear from them, to schedule appointment. Be sure to ask about other offices with therapists if they don't have fast availability.  Please schedule an appointment with a psychiatrist--it may take a while to get an appointment, so having it scheduled now will help (can always cancel if you're doing better and it isn't needed).   BP Readings from Last 3 Encounters:  07/08/18 128/88  05/14/18 110/74  04/06/18 122/70

## 2018-07-28 ENCOUNTER — Other Ambulatory Visit: Payer: Self-pay | Admitting: Family Medicine

## 2018-07-28 DIAGNOSIS — F411 Generalized anxiety disorder: Secondary | ICD-10-CM

## 2018-07-28 DIAGNOSIS — F4322 Adjustment disorder with anxiety: Secondary | ICD-10-CM

## 2018-07-28 NOTE — Telephone Encounter (Signed)
[facility] is requesting to fill pt xanax. Please advise as to Dr. Tomi Bamberger out of the office. [place]

## 2018-08-03 ENCOUNTER — Ambulatory Visit: Payer: Commercial Managed Care - PPO | Admitting: Psychology

## 2018-08-03 ENCOUNTER — Other Ambulatory Visit: Payer: Self-pay

## 2018-08-03 DIAGNOSIS — F4322 Adjustment disorder with anxiety: Secondary | ICD-10-CM

## 2018-08-17 ENCOUNTER — Other Ambulatory Visit: Payer: Self-pay | Admitting: Family Medicine

## 2018-08-17 DIAGNOSIS — F4322 Adjustment disorder with anxiety: Secondary | ICD-10-CM

## 2018-08-17 DIAGNOSIS — F411 Generalized anxiety disorder: Secondary | ICD-10-CM

## 2018-08-17 NOTE — Telephone Encounter (Signed)
[facility] is requesting to fill pt xanax. Please advise [facility] 

## 2018-08-17 NOTE — Telephone Encounter (Signed)
He had this filled on March 10.  Find out what is going on with him

## 2018-08-17 NOTE — Telephone Encounter (Signed)
He was rx'd #45 on 3/10 He has appt 4/1 See if he has enough until his virtual visit Wed, or if he truly needs today

## 2018-08-17 NOTE — Telephone Encounter (Signed)
Spoke with patient and he good for virtual visit Wed but he is out of alprazolam-said he would really appreciate if you could fill today.

## 2018-08-18 NOTE — Progress Notes (Signed)
Start time: 11:50am End time: 12:25pm  Virtual Visit via Video Note  I connected with Robert Hatfield on 08/19/2018 by a video enabled telemedicine application (WebEx) and verified that I am speaking with the correct person using two identifiers. During the video visit, technical issues developed, and visit was completed over the telephone per patient request (after unable to rejoin meeting and get sound working).  Patient is in his office, alone. MD is in office.   I discussed the limitations of evaluation and management by telemedicine and the availability of in person appointments. The patient expressed understanding and agreed to proceed. His visit is to follow-up anxiety, so minimal limitations exist other than being able to monitor his vital signs.  History of Present Illness:  Patient presents to follow up on anxiety.  At his visit in February his anxiety was significantly worse. He was started on Lexapro, and his Buspar dose was increased to 7.5mg  BID. He is tolerating Lexapro without side effects. Slight dull headache (unsure if related to his glasses; headache is back of head, above his ears; off/on x 10 days, doesn't have headache currently),  Took tylenol a couple of times.  For 10 days only took buspar once daily, realized mistake and has been taking BID since. He denies side effects.  "I feel good".  He reports that he still has moments where he feels very panicky and anxious, but overall this is occurring  a little less often. Severity of anxiety is unchanged, but is less often. Relates a lot of the alprazolam need related to the new stressors.  He continues to use alprazolam--last refilled 3/30 #45, prior fill was 3/10.   He reports using it "a little less often".  Mostly used for work stress, takes in the morning before leaving for work, only sometimes at lunch, and sometimes when he gets home from work, to relieve the stress so he can just "be dad".  Mostly takes the full tablet,  sometimes takes 1/2 when he gets home from work. He doesn't find the medication sedating at all.    Since his last visit, his father passed away. His wife has new metastasis (in T2 of spine), just started radiation. Traveling to [facility] daily x 10 days for the radiation. Since COVID-19 and schools being closed, and sports and activities being cancelled, he is a lot less stressed, rushed, running around all the time trying juggle children's activities.  He started counseling-- he has had one visit so far, scheduled for another visit on 4/3. Planning every other week.  At last visit: GAD-7 score of 15 (up from 10 on last check), and PHQ-9 score of 7  PMH, PSH, SH reviewed  Outpatient Encounter Medications as of 08/19/2018  Medication Sig  . ALPRAZolam (XANAX) 0.5 MG tablet TAKE 1/2 TO 1 TABLET THREE TIMES DAILY AS NEEDED FOR ANXIETY.  . busPIRone (BUSPAR) 15 MG tablet Take 1 and a half tablets twice daily  . escitalopram (LEXAPRO) 20 MG tablet Take 1 tablet (20 mg total) by mouth daily.  . [DISCONTINUED] escitalopram (LEXAPRO) 10 MG tablet Start at 1/2 tablet by mouth once daily; increase to full tablet after a week if tolerated.  . [DISCONTINUED] ALPRAZolam (XANAX) 0.5 MG tablet TAKE 1/2 TO 1 TABLET THREE TIMES DAILY AS NEEDED FOR ANXIETY.   No facility-administered encounter medications on file as of 08/19/2018.    (taking 10mg  lexapro prior to today's visit)  No Known Allergies  ROS:  No fever, chills, fatigue, URI symptoms,  chest pain, shortness of breath, GI complaints other than ongoing decreased appetite, no recent change. Denies weight changes.  Mild intermittent headaches, as per HPI, none today.   Observations/Objective:  BP 135/75 Comment: home BP monitor  Ht 6' (1.829 m)   Wt 175 lb (79.4 kg)   BMI 23.73 kg/m    Wt Readings from Last 3 Encounters:  08/19/18 175 lb (79.4 kg)  07/08/18 184 lb 9.6 oz (83.7 kg)  05/14/18 183 lb (83 kg)   Pt's weight today was on home scale  (thereby different from our previous weights, done in office).   Per video, he appears well.  He is in good spirits.  Normal eye contact, speech, grooming. Anxious mood at times, not currently. Full range of affect noted during visit.  GAD-7 score of 8 (down from 15) PHQ-9 score of 7 (unchanged).   Assessment and Plan:  Generalized anxiety disorder - improved, but suboptimally.  Titrate up the lexapro to 20mg  gradually. Cont buspar 7.5mg  BID. f/u 2 mos - Plan: escitalopram (LEXAPRO) 20 MG tablet  Acute adjustment disorder with anxiety - ongoing stressors related to wife's health (new mets), father's death. Encouraged continued counseling, consider grief counseling in future  Discussed rationale for increasing the lexapro, with the goal being decreased use of alprazolam.  If not tolerating higher dose lexapro, can further titrate up Buspar.  Goal is to decrease usage of alprazolam.   Risks/SE of meds reviewed.  Follow Up Instructions:    F/u 2 months for office visit for f/u.  I discussed the assessment and treatment plan with the patient. The patient was provided an opportunity to ask questions and all were answered. The patient agreed with the plan and demonstrated an understanding of the instructions.    I provided 35 minutes of non-face-to-face time during this encounter. (more than 1/2 spent counseling)   Vikki Ports, MD

## 2018-08-19 ENCOUNTER — Other Ambulatory Visit: Payer: Self-pay

## 2018-08-19 ENCOUNTER — Encounter: Payer: Self-pay | Admitting: Family Medicine

## 2018-08-19 ENCOUNTER — Ambulatory Visit (INDEPENDENT_AMBULATORY_CARE_PROVIDER_SITE_OTHER): Payer: Commercial Managed Care - PPO | Admitting: Family Medicine

## 2018-08-19 VITALS — BP 135/75 | Ht 72.0 in | Wt 175.0 lb

## 2018-08-19 DIAGNOSIS — F411 Generalized anxiety disorder: Secondary | ICD-10-CM

## 2018-08-19 DIAGNOSIS — F4322 Adjustment disorder with anxiety: Secondary | ICD-10-CM

## 2018-08-19 MED ORDER — ESCITALOPRAM OXALATE 20 MG PO TABS: 20.0000 mg | ORAL_TABLET | Freq: Every day | ORAL | 2 refills | Status: DC

## 2018-08-19 NOTE — Patient Instructions (Signed)
Overall, it appears that the escitalopram (lexapro) has decreased the frequency of the anxiety and panicky feelings, but not the severity.  For this reason, we discussed increasing the dose of lexapro.   Start taking 15mg  (1.5 tablets of the 10mg  that you have at home) once daily for a week, and if tolerating then increase to the 20mg  dose.  I sent in a new prescription for the higher, 20mg  dose. Continue the 7.5mg  of Buspar twice daily. Continue to work on relaxation techniques and try and minimize the use of alprazolam to severe anxiety/panic attacks. Continue with your counseling sessions, and also consider grief counseling.  Feel free to reach out over the phone or MyChart with any questions or concerns. Someone from the office will be contacting you to set up a 2 month follow-up visit.  Stay home as much as you can, and hoping that you all stay well!

## 2018-08-21 ENCOUNTER — Ambulatory Visit (INDEPENDENT_AMBULATORY_CARE_PROVIDER_SITE_OTHER): Payer: Commercial Managed Care - PPO | Admitting: Psychology

## 2018-08-21 DIAGNOSIS — F1220 Cannabis dependence, uncomplicated: Secondary | ICD-10-CM

## 2018-08-21 DIAGNOSIS — F4322 Adjustment disorder with anxiety: Secondary | ICD-10-CM | POA: Diagnosis not present

## 2018-09-04 ENCOUNTER — Ambulatory Visit (INDEPENDENT_AMBULATORY_CARE_PROVIDER_SITE_OTHER): Payer: Commercial Managed Care - PPO | Admitting: Psychology

## 2018-09-04 DIAGNOSIS — F4322 Adjustment disorder with anxiety: Secondary | ICD-10-CM

## 2018-09-11 ENCOUNTER — Other Ambulatory Visit: Payer: Self-pay | Admitting: Family Medicine

## 2018-09-11 DIAGNOSIS — F4322 Adjustment disorder with anxiety: Secondary | ICD-10-CM

## 2018-09-11 DIAGNOSIS — F411 Generalized anxiety disorder: Secondary | ICD-10-CM

## 2018-09-14 NOTE — Telephone Encounter (Signed)
Is this okay to refill? 

## 2018-10-09 ENCOUNTER — Ambulatory Visit: Payer: Commercial Managed Care - PPO | Admitting: Psychology

## 2018-11-10 NOTE — Progress Notes (Signed)
Chief Complaint  Patient presents with  . Annual Exam    fasting annual exam. Has a place under his right armpit he would like for you to look at.     Robert Hatfield is a 48 y.o. male who presents for a complete physical.  He has the following concerns:  "I have a touch of poison ivy" at his left thigh and left ankle. Had it on his hand at one point too. Seems to be resolving, not spreading Using [facility] OTC which helps. Doesn't seem to be as bad as when he has gotten it in the past.  R axilla--has a "growth" for years, wants it checked, no changes, nontender.  Anxiety:  He was started on Lexapro in 06/2018, and Buspar dose was also increased to 7.61m BID at that time.  He was tolerating medications without side effects, and was continuing to need alprazolam pretty regularly.  At last visit he reported mostly using the alprazolam for work stress, takes in the morning before leaving for work, only sometimes at lunch, and sometimes when he gets home from work, to relieve the stress so he can just "be dad".  Mostly takes the full tablet, sometimes takes 1/2 when he gets home from work. He doesn't find the medication sedating at all.   Alprazolam was filled: #45 on 4/27, 3/30, 3/10, 2/19, 1/24 and 12/26.  Stressors discussed in April included the passing of his father, his wife having new metastasis (in T2 of spine), [facility]. Since COVID-19 and schools being closed, and sports and activities being cancelled, he is a lot less stressed, rushed, running around all the time trying juggle children's activities.  He had just started counseling prior to his last visit (had only had one session).  Had 2 additional sessions, hasn't rescheduled after cancelling a visit.  Doesn't feel like he needs it.  08/2018: GAD-7 score of 8, PHQ-9 score of 7 (when on 150mof lexapro) 06/2018: GAD-7 score of 15, PHQ-9 score of 7 (prior to starting lexapro)  His lexapro dose was increased to 2055mt the  April visit. He reports today that he stopped the Buspar 3-4 weeks ago. He couldn't tell that it helped much, especially when stress was less. He left his job 6 weeks ago (didn't work well with new VP), got some severance.  Has enjoyed the last 6 weeks being with his family, just got back from the beach. His wife is doing well--completed radiation, getting immunotherapy and is responding--had a good report from recent scans, no new areas. He reports doing much better, moods are better. He has noted a slight decrease in libido since being on the lexapro, but isn't problematic for him at this time.  H/o vitamin D deficiency:  Level was 24.5 in 10/2017.  He took the prescription, but never started on a supplement.  He was noted to have a small umbilical and ventral hernia at prior physicals. He has noted decrease in size/symptoms since losing weight in the last few months. He no longer sees an umbilical hernia, no longer gets any discomfort.  He does still see the bulge in the upper abdomen, never painful.  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  Never got vasectomy (mother passed away the day prior to scheduled surgery). He had thought his wife was menopausal, but started with cycles again.  Isn't sure if he wants surgery.  Mildly  elevated cholesterol in the past. He eats red meat at least 2-3x/week, +cheese; 2-3 eggs/week. 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 Has a hard time sensing the position, has almost fallen putting on underwear if he isn't looking. Denies any change. Has foot pain from wearing boots; pain resolved with insoles in his  boots (recurs when wearing flip flops in summer). Father had neuropathy, s/p amputations, h/o AAA, poss PAD.   Immunization History  Administered Date(s) Administered  . Influenza,inj,Quad PF,6+ Mos 05/30/2014, 02/24/2018  . Td 10/08/2000  . Tdap 04/09/2012   Last colonoscopy: never Last PSA: 1.3 in 10/2017 Dentist: twice yearly Ophtho: never. got reading glasses Exercise:  exercise routine 5x/week (TRX and home exercises); not currently getting cardio (used to walk).  Past Medical History:  Diagnosis Date  . Acne vulgaris    s/p accutane treatment  . 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 Ramos)  . Depression 2000-2002, 2015   treated with Zoloft and Klonopin x 1-2 yrs; recurrent in 2015  . Pure hyperglyceridemia     Past Surgical History:  Procedure Laterality Date  . arthroscopic knee    . HUMERUS FRACTURE SURGERY  age 13   R arm  . KNEE ARTHROSCOPY Bilateral in his 20s  . tennis elbow repair     R arm  . TONSILLECTOMY  child    Social History   Socioeconomic History  . Marital status: Married    Spouse name: Not on file  . Number of children: 2  . Years of education: Not on file  . Highest education level: Not on file  Occupational History  . Occupation: general contractor    Employer: [employer]  Social Needs  . Financial resource strain: Not on file  . Food insecurity    Worry: Not on file    Inability: Not on file  . Transportation needs    Medical: Not on file    Non-medical: Not on file  Tobacco Use  . Smoking status: Former Smoker    Quit date: 05/21/2003    Years since quitting: 15.4  . Smokeless tobacco: Never Used  Substance and Sexual Activity  . Alcohol use: Not Currently    Alcohol/week: 0.0 standard drinks    Frequency: Never    Comment: 0 per day since Jan 2,2019 (12-15 beers/d for a long time)  . Drug use: Yes    Types: Marijuana    Comment: occasionally, 3/week  . Sexual activity: Yes    Partners: Female   Lifestyle  . Physical activity    Days per week: Not on file    Minutes per session: Not on file  . Stress: Not on file  Relationships  . Social connections    Talks on phone: Not on file    Gets together: Not on file    Attends religious service: Not on file    Active member of club or organization: Not on file    Attends meetings of clubs or organizations: Not on file    Relationship status: Not on file  . Intimate partner violence    Fear of current or ex partner: Not on file    Emotionally   abused: Not on file    Physically abused: Not on file    Forced sexual activity: Not on file  Other Topics Concern  . Not on file  Social History Narrative   Lives with wife, 2 sons, 2 dogs. 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 Depression Mother   . Cancer Mother        lung cancer (smoker)  . Alcoholism Mother   . Hypertension Father   . Cancer Father        prostate cancer, 70's  . AAA (abdominal aortic aneurysm) Father        repaired  . Kidney disease Father        s/p AAA repair; on dialysis  . Depression Brother   . Cancer Paternal Grandmother        breast cancer  . Diabetes Neg Hx   . Heart disease Neg Hx     Outpatient Encounter Medications as of 11/11/2018  Medication Sig  . ALPRAZolam (XANAX) 0.5 MG tablet Take 0.5-1 tablets (0.25-0.5 mg total) by mouth 3 (three) times daily as needed for anxiety.  . escitalopram (LEXAPRO) 20 MG tablet Take 1 tablet (20 mg total) by mouth daily.  . [DISCONTINUED] ALPRAZolam (XANAX) 0.5 MG tablet TAKE 1/2 TO 1 TABLET THREE TIMES DAILY AS NEEDED FOR ANXIETY.  . [DISCONTINUED] escitalopram (LEXAPRO) 20 MG tablet Take 1 tablet (20 mg total) by mouth daily.  . busPIRone (BUSPAR) 15 MG tablet Take 1 and a half tablets twice daily (Patient not taking: Reported on 11/11/2018)   No facility-administered encounter medications on file as of 11/11/2018.     No  Known Allergies   ROS: The patient denies anorexia, fever, headaches,vision loss, decreased hearing, ear pain, hoarseness, chest pain, palpitations, dizziness, syncope, dyspnea on exertion, cough, swelling, nausea, vomiting, diarrhea, constipation, abdominal pain, melena, hematochezia, indigestion/heartburn, hematuria, incontinence, erectile dysfunction, nocturia, weakened urine stream, dysuria, genital lesions, tremor, suspicious skin lesions, depression, anxiety, abnormal bleeding/bruising, or enlarged lymph nodes. Some sinus congestion with season changes/allergies. More in the fall than the spring, nothing recently. Rare shoulder ache (right). Numbness in both feet Some rare pressure noted from hernia, no discomfort. Just got back from a week at the beach, gained a little weight.  PHYSICAL EXAM:  BP 120/74   Pulse 72   Temp (!) 96.2 F (35.7 C) (Temporal)   Ht 6' (1.829 m)   Wt 186 lb 6.4 oz (84.6 kg)   BMI 25.28 kg/m   Wt Readings from Last 3 Encounters:  11/11/18 186 lb 6.4 oz (84.6 kg)  08/19/18 175 lb (79.4 kg)  07/08/18 184 lb 9.6 oz (83.7 kg)    General Appearance:   Alert, cooperative, no distress, appears stated age  Head:   Normocephalic, without obvious abnormality, atraumatic  Eyes:   PERRL, conjunctiva/corneas clear, EOM's intact, fundi benign  Ears:   Normal TM's and external ear canals  Nose:  not examined, wearing mask (COVID-19)  Throat:   Not examined, wearing mask (COVID-19)  Neck:  Supple, no lymphadenopathy; thyroid: noenlargement/ tenderness/nodules; no carotid bruit or JVD  Back:  Spine nontender, no curvature, ROM normal, no CVA tenderness  Lungs:   Clear to auscultation bilaterally without wheezes, rales or ronchi; respirations unlabored  Chest Wall:   No tenderness or deformity  Heart:   Regular rate and rhythm, S1 and S2 normal, no murmur, rub  or gallop    Breast Exam:   No chest wall tenderness,  masses or gynecomastia  Abdomen:   Soft, non-tender, nondistended, normoactive bowel sounds,   no masses, no hepatosplenomegaly. Small ventral hernia vs diastasis recti, nontender. No longer has any umbilical hernia.   Genitalia:   Normal male external genitalia without lesions. Testicles without masses. No inguinal hernias.  Rectal:   Normal sphincter tone, no masses or tenderness; guaiac negative stool. Prostate smooth, no nodules, not enlarged.  Extremities:  No clubbing, cyanosis or edema  Pulses:  2+ and symmetric all extremities  Skin:  Skin color, texture, turgor normal. Scars on upper back from prior cysts. He has a few scattered papules and scabbed areas at his left ankle and left thigh.  No crusting, erythema, warmth.   R axilla: 3.5 x 1.5 cm mobile mass R axilla, attached to skin  Lymph nodes:  Cervical, supraclavicular, and axillary nodes normal  Neurologic:  CNII-XII intact, normal strength and gait; DTRs symmetric. He had decreased temperature sensation at all toes, normal at metatarsal heads. Normal monofilament exam     Psych: Normal mood, affect, hygiene and grooming.  GAD-7 score of 2 PHQ-9 score of 0    ASSESSMENT/PLAN:  Annual physical exam - Plan: Lipid panel, Comprehensive metabolic panel, CBC with Differential/Platelet, PSA  Vitamin D deficiency - noncompliant with supplements, suspect will be low again. Discussed daily recs - Plan: VITAMIN D 25 Hydroxy (Vit-D Deficiency, Fractures)  Family history of prostate cancer   Screening for prostate cancer  Pure hypercholesterolemia - Plan: Lipid panel  Generalized anxiety disorder - significantly improved, helped by the fact that he doesn't currently have job stress. Cont lexapro 20mg, okay to remain off Buspar. xanax prn - Plan: escitalopram (LEXAPRO) 20 MG tablet, ALPRAZolam (XANAX) 0.5 MG tablet  Generalized anxiety disorder - slight improvement with Buspar--increase  dose to 1.5 tabs BID; Start lexapro 10mg. Counseling. Schedule w/psych - Plan: escitalopram (LEXAPRO) 20 MG tablet, ALPRAZolam (XANAX) 0.5 MG tablet  Sebaceous cyst - of R axilla, present for many years without change or infection. Pt is considering removal, rec general surgery if/when ready   Plant dermatitis - mild; continue OTC hydrocortisone as needed     c-met, CBC, PSA, lipids  Discussed PSA screening (risks/benefits). Recommended at least 30 minutes of aerobic activity at least 5 days/week; proper sunscreen use reviewed; healthy diet and alcohol recommendations (less than or equal to 2 drinks/day) reviewed; regular seatbelt use; changing batteries in smoke detectors. Self-testicular exams. Immunization recommendations discussed--yearly flu shots recommended. Shingrix age 50.  Colonoscopy recommendations reviewed--age 48 (declines referral now).  

## 2018-11-10 NOTE — Patient Instructions (Addendum)
HEALTH MAINTENANCE RECOMMENDATIONS:  It is recommended that you get at least 30 minutes of aerobic exercise at least 5 days/week (for weight loss, you may need as much as 60-90 minutes). This can be any activity that gets your heart rate up. This can be divided in 10-15 minute intervals if needed, but try and build up your endurance at least once a week.  Weight bearing exercise is also recommended twice weekly.  Eat a healthy diet with lots of vegetables, fruits and fiber.  "Colorful" foods have a lot of vitamins (ie green vegetables, tomatoes, red peppers, etc).  Limit sweet tea, regular sodas and alcoholic beverages, all of which has a lot of calories and sugar.  Up to 2 alcoholic drinks daily may be beneficial for men (unless trying to lose weight, watch sugars).  Drink a lot of water.  Sunscreen of at least SPF 30 should be used on all sun-exposed parts of the skin when outside between the hours of 10 am and 4 pm (not just when at beach or pool, but even with exercise, golf, tennis, and yard work!)  Use a sunscreen that says "broad spectrum" so it covers both UVA and UVB rays, and make sure to reapply every 1-2 hours.  Remember to change the batteries in your smoke detectors when changing your clock times in the spring and fall. Carbon monoxide detectors are recommended for your home.  Use your seat belt every time you are in a car, and please drive safely and not be distracted with cell phones and texting while driving.  Shingles vaccine is recommended at age 50. You will need to check with your insurance to see if it is covered (after age 50), and if covered, schedule nurse visit at our office.  It is a series of 2 injections, spaced 2 months apart. Remember that it will cause arm soreness, and potentially flu-like symptoms with severe body aches (short-lived, 1-2 days).  Colonoscopy is recommended at age 50.    Please schedule a routine eye exam.   Epidermal Cyst  An epidermal cyst is  a sac made of skin tissue. The sac contains a substance called keratin. Keratin is a protein that is normally secreted through the hair follicles. When keratin becomes trapped in the top layer of skin (epidermis), it can form an epidermal cyst. Epidermal cysts can be found anywhere on your body. These cysts are usually harmless (benign), and they may not cause symptoms unless they become infected. What are the causes? This condition may be caused by:  A blocked hair follicle.  A hair that curls and re-enters the skin instead of growing straight out of the skin (ingrown hair).  A blocked pore.  Irritated skin.  An injury to the skin.  Certain conditions that are passed along from parent to child (inherited).  Human papillomavirus (HPV).  Long-term (chronic) sun damage to the skin. What increases the risk? The following factors may make you more likely to develop an epidermal cyst:  Having acne.  Being overweight.  Being 50-50 years old. What are the signs or symptoms? The only symptom of this condition may be a small, painless lump underneath the skin. When an epidermal cyst ruptures, it may become infected. Symptoms may include:  Redness.  Inflammation.  Tenderness.  Warmth.  Fever.  Keratin draining from the cyst. Keratin is grayish-white, bad-smelling substance.  Pus draining from the cyst. How is this diagnosed? This condition is diagnosed with a physical exam.  In some cases,  you may have a sample of tissue (biopsy) taken from your cyst to be examined under a microscope or tested for bacteria.  You may be referred to a health care provider who specializes in skin care (dermatologist). How is this treated? In many cases, epidermal cysts go away on their own without treatment. If a cyst becomes infected, treatment may include:  Opening and draining the cyst, done by a health care provider. After draining, minor surgery to remove the rest of the cyst may be done.   Antibiotic medicine.  Injections of medicines (steroids) that help to reduce inflammation.  Surgery to remove the cyst. Surgery may be done if the cyst: ? Becomes large. ? Bothers you. ? Has a chance of turning into cancer.  Do not try to open a cyst yourself. Follow these instructions at home:  Take over-the-counter and prescription medicines only as told by your health care provider.  If you were prescribed an antibiotic medicine, take it it as told by your health care provider. Do not stop using the antibiotic even if you start to feel better.  Keep the area around your cyst clean and dry.  Wear loose, dry clothing.  Avoid touching your cyst.  Check your cyst every day for signs of infection. Check for: ? Redness, swelling, or pain. ? Fluid or blood. ? Warmth. ? Pus or a bad smell.  Keep all follow-up visits as told by your health care provider. This is important. How is this prevented?  Wear clean, dry, clothing.  Avoid wearing tight clothing.  Keep your skin clean and dry. Take showers or baths every day. Contact a health care provider if:  Your cyst develops symptoms of infection.  Your condition is not improving or is getting worse.  You develop a cyst that looks different from other cysts you have had.  You have a fever. Get help right away if:  Redness spreads from the cyst into the surrounding area. Summary  An epidermal cyst is a sac made of skin tissue. These cysts are usually harmless (benign), and they may not cause symptoms unless they become infected.  If a cyst becomes infected, treatment may include surgery to open and drain the cyst, or to remove it. Treatment may also include medicines by mouth or through an injection.  Take over-the-counter and prescription medicines only as told by your health care provider. If you were prescribed an antibiotic medicine, take it as told by your health care provider. Do not stop using the antibiotic even  if you start to feel better.  Contact a health care provider if your condition is not improving or is getting worse.  Keep all follow-up visits as told by your health care provider. This is important. This information is not intended to replace advice given to you by your health care provider. Make sure you discuss any questions you have with your health care provider. Document Released: 04/06/2004 Document Revised: 11/17/2017 Document Reviewed: 11/17/2017 Elsevier Interactive Patient Education  2019 [employer].

## 2018-11-11 ENCOUNTER — Encounter: Payer: Self-pay | Admitting: Family Medicine

## 2018-11-11 ENCOUNTER — Other Ambulatory Visit: Payer: Self-pay

## 2018-11-11 ENCOUNTER — Ambulatory Visit (INDEPENDENT_AMBULATORY_CARE_PROVIDER_SITE_OTHER): Payer: Commercial Managed Care - PPO | Admitting: Family Medicine

## 2018-11-11 VITALS — BP 120/74 | HR 72 | Temp 96.2°F | Ht 72.0 in | Wt 186.4 lb

## 2018-11-11 DIAGNOSIS — E7800 Pure hypercholesterolemia, unspecified: Secondary | ICD-10-CM | POA: Diagnosis not present

## 2018-11-11 DIAGNOSIS — Z0000 Encounter for general adult medical examination without abnormal findings: Secondary | ICD-10-CM | POA: Diagnosis not present

## 2018-11-11 DIAGNOSIS — Z8042 Family history of malignant neoplasm of prostate: Secondary | ICD-10-CM

## 2018-11-11 DIAGNOSIS — E559 Vitamin D deficiency, unspecified: Secondary | ICD-10-CM

## 2018-11-11 DIAGNOSIS — Z125 Encounter for screening for malignant neoplasm of prostate: Secondary | ICD-10-CM | POA: Diagnosis not present

## 2018-11-11 DIAGNOSIS — L723 Sebaceous cyst: Secondary | ICD-10-CM

## 2018-11-11 DIAGNOSIS — F411 Generalized anxiety disorder: Secondary | ICD-10-CM

## 2018-11-11 DIAGNOSIS — L255 Unspecified contact dermatitis due to plants, except food: Secondary | ICD-10-CM

## 2018-11-11 MED ORDER — ESCITALOPRAM OXALATE 20 MG PO TABS: 20.0000 mg | ORAL_TABLET | Freq: Every day | ORAL | 3 refills | Status: DC

## 2018-11-11 MED ORDER — ALPRAZOLAM 0.5 MG PO TABS: 0.2500 mg | ORAL_TABLET | Freq: Three times a day (TID) | ORAL | 0 refills | Status: DC | PRN

## 2018-11-12 LAB — COMPREHENSIVE METABOLIC PANEL
ALT: 28 IU/L (ref 0–44)
AST: 21 IU/L (ref 0–40)
Albumin/Globulin Ratio: 1.9 (ref 1.2–2.2)
Albumin: 4.5 g/dL (ref 4.0–5.0)
Alkaline Phosphatase: 61 IU/L (ref 39–117)
BUN/Creatinine Ratio: 12 (ref 9–20)
BUN: 10 mg/dL (ref 6–24)
Bilirubin Total: 0.5 mg/dL (ref 0.0–1.2)
CO2: 24 mmol/L (ref 20–29)
Calcium: 9.4 mg/dL (ref 8.7–10.2)
Chloride: 101 mmol/L (ref 96–106)
Creatinine, Ser: 0.84 mg/dL (ref 0.76–1.27)
GFR calc Af Amer: 119 mL/min/{1.73_m2} (ref >59)
GFR calc non Af Amer: 103 mL/min/{1.73_m2} (ref >59)
Globulin, Total: 2.4 g/dL (ref 1.5–4.5)
Glucose: 81 mg/dL (ref 65–99)
Potassium: 4.3 mmol/L (ref 3.5–5.2)
Sodium: 140 mmol/L (ref 134–144)
Total Protein: 6.9 g/dL (ref 6.0–8.5)

## 2018-11-12 LAB — CBC WITH DIFFERENTIAL/PLATELET
Basophils Absolute: 0 10*3/uL (ref 0.0–0.2)
Basos: 1 %
EOS (ABSOLUTE): 0.1 10*3/uL (ref 0.0–0.4)
Eos: 1 %
Hematocrit: 42.5 % (ref 37.5–51.0)
Hemoglobin: 14.9 g/dL (ref 13.0–17.7)
Immature Grans (Abs): 0 10*3/uL (ref 0.0–0.1)
Immature Granulocytes: 0 %
Lymphocytes Absolute: 2.3 10*3/uL (ref 0.7–3.1)
Lymphs: 38 %
MCH: 30.7 pg (ref 26.6–33.0)
MCHC: 35.1 g/dL (ref 31.5–35.7)
MCV: 88 fL (ref 79–97)
Monocytes Absolute: 0.5 10*3/uL (ref 0.1–0.9)
Monocytes: 9 %
Neutrophils Absolute: 3.1 10*3/uL (ref 1.4–7.0)
Neutrophils: 51 %
Platelets: 272 10*3/uL (ref 150–450)
RBC: 4.85 x10E6/uL (ref 4.14–5.80)
RDW: 13.5 % (ref 11.6–15.4)
WBC: 6 10*3/uL (ref 3.4–10.8)

## 2018-11-12 LAB — LIPID PANEL
Chol/HDL Ratio: 3.7 ratio (ref 0.0–5.0)
Cholesterol, Total: 219 mg/dL — ABNORMAL HIGH (ref 100–199)
HDL: 59 mg/dL (ref >39)
LDL Calculated: 135 mg/dL — ABNORMAL HIGH (ref 0–99)
Triglycerides: 126 mg/dL (ref 0–149)
VLDL Cholesterol Cal: 25 mg/dL (ref 5–40)

## 2018-11-12 LAB — PSA: Prostate Specific Ag, Serum: 1.3 ng/mL (ref 0.0–4.0)

## 2018-11-12 LAB — VITAMIN D 25 HYDROXY (VIT D DEFICIENCY, FRACTURES): Vit D, 25-Hydroxy: 24.4 ng/mL — ABNORMAL LOW (ref 30.0–100.0)

## 2018-11-12 MED ORDER — VITAMIN D (ERGOCALCIFEROL) 1.25 MG (50000 UNIT) PO CAPS: 50000.0000 [IU] | ORAL_CAPSULE | ORAL | 0 refills | Status: DC

## 2018-11-12 NOTE — Addendum Note (Signed)
Addended by: Rita Ohara on: 11/12/2018 07:23 AM   Modules accepted: Orders

## 2019-02-11 ENCOUNTER — Encounter: Payer: Self-pay | Admitting: Family Medicine

## 2019-02-11 ENCOUNTER — Ambulatory Visit: Payer: Commercial Managed Care - PPO | Admitting: Family Medicine

## 2019-02-11 ENCOUNTER — Other Ambulatory Visit: Payer: Self-pay

## 2019-02-11 VITALS — BP 118/88 | HR 80 | Temp 98.4°F | Ht 72.0 in | Wt 194.2 lb

## 2019-02-11 DIAGNOSIS — Z23 Encounter for immunization: Secondary | ICD-10-CM | POA: Diagnosis not present

## 2019-02-11 DIAGNOSIS — L03119 Cellulitis of unspecified part of limb: Secondary | ICD-10-CM | POA: Diagnosis not present

## 2019-02-11 DIAGNOSIS — L02419 Cutaneous abscess of limb, unspecified: Secondary | ICD-10-CM

## 2019-02-11 DIAGNOSIS — F411 Generalized anxiety disorder: Secondary | ICD-10-CM | POA: Diagnosis not present

## 2019-02-11 MED ORDER — ALPRAZOLAM 0.5 MG PO TABS: 0.2500 mg | ORAL_TABLET | Freq: Three times a day (TID) | ORAL | 0 refills | Status: DC | PRN

## 2019-02-11 MED ORDER — SULFAMETHOXAZOLE-TRIMETHOPRIM 800-160 MG PO TABS: 1.0000 | ORAL_TABLET | Freq: Two times a day (BID) | ORAL | 1 refills | Status: DC

## 2019-02-11 NOTE — Progress Notes (Signed)
Chief Complaint  Patient presents with  . Cyst    infected cyst under right arm. He is hoping for some abx rather than a procedure but is willing to so what is needed,   . Medication Refill    needs refill on xanax.   Has had cyst in right axilla for a long time. Yesterday morning he noticed it being more sensitive to touch, feels like it is burning.  It has gotten a little bigger.  It feels worse than it looks.  No known fever. No drainage yet. He really doesn't want it lanced today.  He hasn't tried warm compresses yet.  He stopped taking lexapro and buspar about a month ago. He has been doing well.  He ran out of xanax about 6 weeks ago.  Last filled #45 6/24 (through per PDMP, he didn't pick that one up?).  Only has had a few days where he felt like he needed a xanax. He is requesting refill. He did get some counseling; once he left his job and father passed away, things got easier.  Wife is stable, getting immunotherapy monthly, doing well. He is not working currently (will start January), is home with wife. Kids are back in school [place]) Getting some exercise lately (walking, pushups/situps). +weight gain, which he relates to a time without much exercise, as well as eating more sweets. Still not drinking alcohol.  H/o Vitamin D deficiency.  He completed the Rx D and is taking MVI daily.   PMH, PSH, SH reviewed  Outpatient Encounter Medications as of 02/11/2019  Medication Sig  . ALPRAZolam (XANAX) 0.5 MG tablet Take 0.5-1 tablets (0.25-0.5 mg total) by mouth 3 (three) times daily as needed for anxiety.  Marland Kitchen escitalopram (LEXAPRO) 20 MG tablet Take 1 tablet (20 mg total) by mouth daily. (Patient not taking: Reported on 02/11/2019)  . Vitamin D, Ergocalciferol, (DRISDOL) 1.25 MG (50000 UT) CAPS capsule Take 1 capsule (50,000 Units total) by mouth every 7 (seven) days. (Patient not taking: Reported on 02/11/2019)   No facility-administered encounter medications on file as of 02/11/2019.     No longer taking rx D; is taking MVI daily.  No Known Allergies  ROS: no fever, chills, URI symptoms, cough, shortness of breath, GI complaints, bleeding, bruising.  +painful cyst R axilla, no other skin concerns. Moods are good, only intermittent anxiety. See HPI   PHYSICAL EXAM:  BP 118/88   Pulse 80   Temp 98.4 F (36.9 C) (Other (Comment))   Ht 6' (1.829 m)   Wt 194 lb 3.2 oz (88.1 kg)   BMI 26.34 kg/m   Wt Readings from Last 3 Encounters:  02/11/19 194 lb 3.2 oz (88.1 kg)  11/11/18 186 lb 6.4 oz (84.6 kg)  08/19/18 175 lb (79.4 kg)   Pleasant, well-appearing male, in mild discomfort with movement of arm and much more discomfort with palpation/exam of cyst. Right axilla:  Area of swelling is 5 x 3 Posterior portion is firm, not red or warm, and not tender Anterior portion measures 3 x 2.5cm and is erythematous and extremely tender. These is fluctuancein this area and pore noted within this area.  No drainage.  There is erythema surrounding this, total area measures 10 x 5 cm.   No axillary adenopathy palpable. No red streaking   ASSESSMENT/:PLAN:  Cellulitis and abscess of upper extremity - Treat with Bactrim for cellulitis; pt declined I&D, prefers to try warm compresses and if doesn't drain on its own will return - Plan:  sulfamethoxazole-trimethoprim (BACTRIM DS) 800-160 MG tablet  Need for influenza vaccination - Plan: Flu Vaccine QUAD 6+ mos PF IM (Fluarix Quad PF)  Generalized anxiety disorder - significantly improved, off meds. Discussed potential for recurrence (meds out of system), and to restart if needing xanax frequently - Plan: ALPRAZolam (XANAX) 0.5 MG tablet  Not sure if the pre-existing cyst has only a portion of it infected, vs got new cyst next to it.  The posterior portion does not appear to be infected.  He will let us know if/when he is interested in referral for excision of the cyst(s).

## 2019-02-11 NOTE — Patient Instructions (Signed)
Start the antibiotics and take it twice daily for 10 days.  There is a refill to be used in case of a FUTURE recurrence (not to continue if it doesn't completely resolve). The redness surrounding the cyst should start to recede within 48 hours.  If it continues to expand, you need to return.  Use moist warm compresses regularly until you see drainage. There is pus that needs to drain--if it doesn't drain on its own, it needs to be lanced.  If your notice increasing anxiety or irritability, you are probably seeing that the lexapro has completely left your system.  If it is worsening, restart it. Otherwise, if you continue to do well, with just need for an occasional xanax, that's fine, and it was refilled today.

## 2019-02-12 ENCOUNTER — Encounter: Payer: Self-pay | Admitting: Family Medicine

## 2019-04-07 ENCOUNTER — Other Ambulatory Visit: Payer: Self-pay | Admitting: Family Medicine

## 2019-04-07 ENCOUNTER — Ambulatory Visit: Payer: Commercial Managed Care - PPO | Admitting: Family Medicine

## 2019-04-07 ENCOUNTER — Encounter: Payer: Self-pay | Admitting: Family Medicine

## 2019-04-07 ENCOUNTER — Other Ambulatory Visit: Payer: Self-pay

## 2019-04-07 VITALS — BP 124/86 | HR 68 | Temp 97.8°F | Ht 72.0 in | Wt 189.0 lb

## 2019-04-07 DIAGNOSIS — U071 COVID-19: Secondary | ICD-10-CM | POA: Diagnosis not present

## 2019-04-07 DIAGNOSIS — F411 Generalized anxiety disorder: Secondary | ICD-10-CM

## 2019-04-07 NOTE — Patient Instructions (Signed)
COVID-19 COVID-19 is a respiratory infection that is caused by a virus called severe acute respiratory syndrome coronavirus 2 (SARS-CoV-2). The disease is also known as coronavirus disease or novel coronavirus. In some people, the virus may not cause any symptoms. In others, it may cause a serious infection. The infection can get worse quickly and can lead to complications, such as:  Pneumonia, or infection of the lungs.  Acute respiratory distress syndrome or ARDS. This is fluid build-up in the lungs.  Acute respiratory failure. This is a condition in which there is not enough oxygen passing from the lungs to the body.  Sepsis or septic shock. This is a serious bodily reaction to an infection.  Blood clotting problems.  Secondary infections due to bacteria or fungus. The virus that causes COVID-19 is contagious. This means that it can spread from person to person through droplets from coughs and sneezes (respiratory secretions). What are the causes? This illness is caused by a virus. You may catch the virus by:  Breathing in droplets from an infected person's cough or sneeze.  Touching something, like a table or a doorknob, that was exposed to the virus (contaminated) and then touching your mouth, nose, or eyes. What increases the risk? Risk for infection You are more likely to be infected with this virus if you:  Live in or travel to an area with a COVID-19 outbreak.  Come in contact with a sick person who recently traveled to an area with a COVID-19 outbreak.  Provide care for or live with a person who is infected with COVID-19. Risk for serious illness You are more likely to become seriously ill from the virus if you:  Are 65 years of age or older.  Have a long-term disease that lowers your body's ability to fight infection (immunocompromised).  Live in a nursing home or long-term care facility.  Have a long-term (chronic) disease such as: ? Chronic lung disease, including  chronic obstructive pulmonary disease or asthma ? Heart disease. ? Diabetes. ? Chronic kidney disease. ? Liver disease.  Are obese. What are the signs or symptoms? Symptoms of this condition can range from mild to severe. Symptoms may appear any time from 2 to 14 days after being exposed to the virus. They include:  A fever.  A cough.  Difficulty breathing.  Chills.  Muscle pains.  A sore throat.  Loss of taste or smell. Some people may also have stomach problems, such as nausea, vomiting, or diarrhea. Other people may not have any symptoms of COVID-19. How is this diagnosed? This condition may be diagnosed based on:  Your signs and symptoms, especially if: ? You live in an area with a COVID-19 outbreak. ? You recently traveled to or from an area where the virus is common. ? You provide care for or live with a person who was diagnosed with COVID-19.  A physical exam.  Lab tests, which may include: ? A nasal swab to take a sample of fluid from your nose. ? A throat swab to take a sample of fluid from your throat. ? A sample of mucus from your lungs (sputum). ? Blood tests.  Imaging tests, which may include, X-rays, CT scan, or ultrasound. How is this treated? At present, there is no medicine to treat COVID-19. Medicines that treat other diseases are being used on a trial basis to see if they are effective against COVID-19. Your health care provider will talk with you about ways to treat your symptoms. For most   people, the infection is mild and can be managed at home with rest, fluids, and over-the-counter medicines. Treatment for a serious infection usually takes places in a hospital intensive care unit (ICU). It may include one or more of the following treatments. These treatments are given until your symptoms improve.  Receiving fluids and medicines through an IV.  Supplemental oxygen. Extra oxygen is given through a tube in the nose, a face mask, or a hood.   Positioning you to lie on your stomach (prone position). This makes it easier for oxygen to get into the lungs.  Continuous positive airway pressure (CPAP) or bi-level positive airway pressure (BPAP) machine. This treatment uses mild air pressure to keep the airways open. A tube that is connected to a motor delivers oxygen to the body.  Ventilator. This treatment moves air into and out of the lungs by using a tube that is placed in your windpipe.  Tracheostomy. This is a procedure to create a hole in the neck so that a breathing tube can be inserted.  Extracorporeal membrane oxygenation (ECMO). This procedure gives the lungs a chance to recover by taking over the functions of the heart and lungs. It supplies oxygen to the body and removes carbon dioxide. Follow these instructions at home: Lifestyle  If you are sick, stay home except to get medical care. Your health care provider will tell you how long to stay home. Call your health care provider before you go for medical care.  Rest at home as told by your health care provider.  Do not use any products that contain nicotine or tobacco, such as cigarettes, e-cigarettes, and chewing tobacco. If you need help quitting, ask your health care provider.  Return to your normal activities as told by your health care provider. Ask your health care provider what activities are safe for you. General instructions  Take over-the-counter and prescription medicines only as told by your health care provider.  Drink enough fluid to keep your urine pale yellow.  Keep all follow-up visits as told by your health care provider. This is important. How is this prevented?  There is no vaccine to help prevent COVID-19 infection. However, there are steps you can take to protect yourself and others from this virus. To protect yourself:   Do not travel to areas where COVID-19 is a risk. The areas where COVID-19 is reported change often. To identify high-risk areas  and travel restrictions, check the CDC travel website: FatFares.com.br  If you live in, or must travel to, an area where COVID-19 is a risk, take precautions to avoid infection. ? Stay away from people who are sick. ? Wash your hands often with soap and water for 20 seconds. If soap and water are not available, use an alcohol-based hand sanitizer. ? Avoid touching your mouth, face, eyes, or nose. ? Avoid going out in public, follow guidance from your state and local health authorities. ? If you must go out in public, wear a cloth face covering or face mask. ? Disinfect objects and surfaces that are frequently touched every day. This may include:  Counters and tables.  Doorknobs and light switches.  Sinks and faucets.  Electronics, such as phones, remote controls, keyboards, computers, and tablets. To protect others: If you have symptoms of COVID-19, take steps to prevent the virus from spreading to others.  If you think you have a COVID-19 infection, contact your health care provider right away. Tell your health care team that you think you may  have a COVID-19 infection.  Stay home. Leave your house only to seek medical care. Do not use public transport.  Do not travel while you are sick.  Wash your hands often with soap and water for 20 seconds. If soap and water are not available, use alcohol-based hand sanitizer.  Stay away from other members of your household. Let healthy household members care for children and pets, if possible. If you have to care for children or pets, wash your hands often and wear a mask. If possible, stay in your own room, separate from others. Use a different bathroom.  Make sure that all people in your household wash their hands well and often.  Cough or sneeze into a tissue or your sleeve or elbow. Do not cough or sneeze into your hand or into the air.  Wear a cloth face covering or face mask. Where to find more information  Centers for  Disease Control and Prevention: PurpleGadgets.be  World Health Organization: https://www.castaneda.info/ Contact a health care provider if:  You live in or have traveled to an area where COVID-19 is a risk and you have symptoms of the infection.  You have had contact with someone who has COVID-19 and you have symptoms of the infection. Get help right away if:  You have trouble breathing.  You have pain or pressure in your chest.  You have confusion.  You have bluish lips and fingernails.  You have difficulty waking from sleep.  You have symptoms that get worse. These symptoms may represent a serious problem that is an emergency. Do not wait to see if the symptoms will go away. Get medical help right away. Call your local emergency services (911 in the [place] Do not drive yourself to the hospital. Let the emergency medical personnel know if you think you have COVID-19. Summary  COVID-19 is a respiratory infection that is caused by a virus. It is also known as coronavirus disease or novel coronavirus. It can cause serious infections, such as pneumonia, acute respiratory distress syndrome, acute respiratory failure, or sepsis.  The virus that causes COVID-19 is contagious. This means that it can spread from person to person through droplets from coughs and sneezes.  You are more likely to develop a serious illness if you are 42 years of age or older, have a weak immunity, live in a nursing home, or have chronic disease.  There is no medicine to treat COVID-19. Your health care provider will talk with you about ways to treat your symptoms.  Take steps to protect yourself and others from infection. Wash your hands often and disinfect objects and surfaces that are frequently touched every day. Stay away from people who are sick and wear a mask if you are sick. This information is not intended to replace advice given to you by your health care provider.  Make sure you discuss any questions you have with your health care provider. Document Released: 06/11/2018 Document Revised: 10/01/2018      Person Under Monitoring Name: Robert Hatfield  Location: [place] [address]   Infection Prevention Recommendations for Individuals Confirmed to have, or Being Evaluated for, 2019 Novel Coronavirus (COVID-19) Infection Who Receive Care at Home  Individuals who are confirmed to have, or are being evaluated for, COVID-19 should follow the prevention steps below until a healthcare provider or local or state health department says they can return to normal activities.  Stay home except to get medical care You should restrict activities outside  your home, except for getting medical care. Do not go to work, school, or public areas, and do not use public transportation or taxis.  Call ahead before visiting your doctor Before your medical appointment, call the healthcare provider and tell them that you have, or are being evaluated for, COVID-19 infection. This will help the healthcare provider's office take steps to keep other people from getting infected. Ask your healthcare provider to call the local or state health department.  Monitor your symptoms Seek prompt medical attention if your illness is worsening (e.g., difficulty breathing). Before going to your medical appointment, call the healthcare provider and tell them that you have, or are being evaluated for, COVID-19 infection. Ask your healthcare provider to call the local or state health department.  Wear a facemask You should wear a facemask that covers your nose and mouth when you are in the same room with other people and when you visit a healthcare provider. People who live with or visit you should also wear a facemask while they are in the same room with you.  Separate yourself from other people in your home As much as possible, you should stay in a different room from  other people in your home. Also, you should use a separate bathroom, if available.  Avoid sharing household items You should not share dishes, drinking glasses, cups, eating utensils, towels, bedding, or other items with other people in your home. After using these items, you should wash them thoroughly with soap and water.  Cover your coughs and sneezes Cover your mouth and nose with a tissue when you cough or sneeze, or you can cough or sneeze into your sleeve. Throw used tissues in a lined trash can, and immediately wash your hands with soap and water for at least 20 seconds or use an alcohol-based hand rub.  Wash your [facility] your hands often and thoroughly with soap and water for at least 20 seconds. You can use an alcohol-based hand sanitizer if soap and water are not available and if your hands are not visibly dirty. Avoid touching your eyes, nose, and mouth with unwashed hands.   Prevention Steps for Caregivers and Household Members of Individuals Confirmed to have, or Being Evaluated for, COVID-19 Infection Being Cared for in the Home  If you live with, or provide care at home for, a person confirmed to have, or being evaluated for, COVID-19 infection please follow these guidelines to prevent infection:  Follow healthcare provider's instructions Make sure that you understand and can help the patient follow any healthcare provider instructions for all care.  Provide for the patient's basic needs You should help the patient with basic needs in the home and provide support for getting groceries, prescriptions, and other personal needs.  Monitor the patient's symptoms If they are getting sicker, call his or her medical provider and tell them that the patient has, or is being evaluated for, COVID-19 infection. This will help the healthcare provider's office take steps to keep other people from getting infected. Ask the healthcare provider to call the local or state health  department.  Limit the number of people who have contact with the patient  If possible, have only one caregiver for the patient.  Other household members should stay in another home or place of residence. If this is not possible, they should stay  in another room, or be separated from the patient as much as possible. Use a separate bathroom, if available.  Restrict visitors who do  not have an essential need to be in the home.  Keep older adults, very young children, and other sick people away from the patient Keep older adults, very young children, and those who have compromised immune systems or chronic health conditions away from the patient. This includes people with chronic heart, lung, or kidney conditions, diabetes, and cancer.  Ensure good ventilation Make sure that shared spaces in the home have good air flow, such as from an air conditioner or an opened window, weather permitting.  Wash your hands often  Wash your hands often and thoroughly with soap and water for at least 20 seconds. You can use an alcohol based hand sanitizer if soap and water are not available and if your hands are not visibly dirty.  Avoid touching your eyes, nose, and mouth with unwashed hands.  Use disposable paper towels to dry your hands. If not available, use dedicated cloth towels and replace them when they become wet.  Wear a facemask and gloves  Wear a disposable facemask at all times in the room and gloves when you touch or have contact with the patient's blood, body fluids, and/or secretions or excretions, such as sweat, saliva, sputum, nasal mucus, vomit, urine, or feces.  Ensure the mask fits over your nose and mouth tightly, and do not touch it during use.  Throw out disposable facemasks and gloves after using them. Do not reuse.  Wash your hands immediately after removing your facemask and gloves.  If your personal clothing becomes contaminated, carefully remove clothing and launder. Wash  your hands after handling contaminated clothing.  Place all used disposable facemasks, gloves, and other waste in a lined container before disposing them with other household waste.  Remove gloves and wash your hands immediately after handling these items.  Do not share dishes, glasses, or other household items with the patient  Avoid sharing household items. You should not share dishes, drinking glasses, cups, eating utensils, towels, bedding, or other items with a patient who is confirmed to have, or being evaluated for, COVID-19 infection.  After the person uses these items, you should wash them thoroughly with soap and water.  Wash laundry thoroughly  Immediately remove and wash clothes or bedding that have blood, body fluids, and/or secretions or excretions, such as sweat, saliva, sputum, nasal mucus, vomit, urine, or feces, on them.  Wear gloves when handling laundry from the patient.  Read and follow directions on labels of laundry or clothing items and detergent. In general, wash and dry with the warmest temperatures recommended on the label.  Clean all areas the individual has used often  Clean all touchable surfaces, such as counters, tabletops, doorknobs, bathroom fixtures, toilets, phones, keyboards, tablets, and bedside tables, every day. Also, clean any surfaces that may have blood, body fluids, and/or secretions or excretions on them.  Wear gloves when cleaning surfaces the patient has come in contact with.  Use a diluted bleach solution (e.g., dilute bleach with 1 part bleach and 10 parts water) or a household disinfectant with a label that says EPA-registered for coronaviruses. To make a bleach solution at home, add 1 tablespoon of bleach to 1 quart (4 cups) of water. For a larger supply, add  cup of bleach to 1 gallon (16 cups) of water.  Read labels of cleaning products and follow recommendations provided on product labels. Labels contain instructions for safe and  effective use of the cleaning product including precautions you should take when applying the product, such as wearing  gloves or eye protection and making sure you have good ventilation during use of the product.  Remove gloves and wash hands immediately after cleaning.  Monitor yourself for signs and symptoms of illness Caregivers and household members are considered close contacts, should monitor their health, and will be asked to limit movement outside of the home to the extent possible. Follow the monitoring steps for close contacts listed on the symptom monitoring form.   ? If you have additional questions, contact your local health department or call the epidemiologist on call at [phone] (available 24/7). ? This guidance is subject to change. For the most up-to-date guidance from [facility], please refer to their website: [email or web address].pl

## 2019-04-07 NOTE — Progress Notes (Signed)
Start time: 4:05  Switched to telephone call after a few minutes, due to poor internet connection causing a lot of freezing, and interrupting ability to communicate. End time: 4:31  Virtual Visit via Video Note  I connected with Robert Hatfield on 04/07/19  by a video enabled telemedicine application and verified that I am speaking with the correct person using two identifiers.  Location: Patient: home Provider: office   I discussed the limitations of evaluation and management by telemedicine and the availability of in person appointments. The patient expressed understanding and agreed to proceed.  History of Present Illness:  Chief Complaint  Patient presents with  . Follow-up    positive COVID results today. Has zero symptoms. Son had supposed exposure (friends sister but she was negative). Had test at private outside source (addiction service). Then went today at same place and had rapid test and it was negative. And also did a saliva test as well. Family went back and are doing rapid right now.    Last Friday whole family got tested for COVID (at [facility]), due to his son having a potential exposure.  The potential exposure ended up being negative for COVID. His son (who had the suspected exposure) had negative rapid test, which was 5 days after exposure. Rest of the family did nasal swab--results came back today--patient was positive, everybody else is negative.  Testing was done 5 days ago.  He has had no symptoms. He was quite surprised to see the positive test, given that the rest of the family was negative, there was no actual known exposure, and he hasn't had any symptoms. He went and had another rapid test today, which was negative. Had another nasal swab today also, results pending.  He is asking for advice regarding these confusing test results, as well as what his kids should be doing. +high risk wife. They are very safe.  Son has been at sports practice (yesterday,  prior to getting father's +result).  He had requested refill of alprazolam earlier today, which has been filled.  PMH, PSH, SH reviewed  Outpatient Encounter Medications as of 04/07/2019  Medication Sig  . ALPRAZolam (XANAX) 0.5 MG tablet TAKE 1/2 TO 1 TABLET THREE TIMES DAILY AS NEEDED FOR ANXIETY.  . Multiple Vitamin (MULTIVITAMIN) tablet Take 1 tablet by mouth daily.  . [DISCONTINUED] escitalopram (LEXAPRO) 20 MG tablet Take 1 tablet (20 mg total) by mouth daily. (Patient not taking: Reported on 02/11/2019)  . [DISCONTINUED] sulfamethoxazole-trimethoprim (BACTRIM DS) 800-160 MG tablet Take 1 tablet by mouth 2 (two) times daily.  . [DISCONTINUED] Vitamin D, Ergocalciferol, (DRISDOL) 1.25 MG (50000 UT) CAPS capsule Take 1 capsule (50,000 Units total) by mouth every 7 (seven) days. (Patient not taking: Reported on 02/11/2019)   No facility-administered encounter medications on file as of 04/07/2019.    No Known Allergies  ROS: no fever, chills, loss of taste or smell, no URI symptoms, cough, chest pain, rash, GI complaints, headaches or other complaints.    Observations/Objective:  BP 124/86   Pulse 68   Temp 97.8 F (36.6 C) (Temporal)   Ht 6' (1.829 m)   Wt 189 lb (85.7 kg)   BMI 25.63 kg/m   Well-appearing, pleasant male, in good spirits. He is alert, oriented. Normal speech. Normal mood.  Assessment and Plan:  Lab test positive for detection of COVID-19 virus - question of false positive. Neg rapid test today, nasal swab pending. Asymptomatic, no known exposure. Isolate until results back  We discussed  the results, limitations of tests, higher likelihood of false negative tests (especially rapid ones), when asymptomatic.  The false positives are less common, but possible. Discussed normal guidelines re: positive test (isolate for 10 days after +test, since he has no symptoms).  He hasn't really been isolating from family, since all were waiting on results together.   Discussed having kids stay home and learn remotely until results back, quarantine from others.  If nasal swab done today is negative, suspect false positive test.  If positive and no symptoms, Monday will be 10 days from last test, when was positive.  All questions and scenarios given, recommendations/guidelines discussed.  Alprazolam was refilled earlier today, pt advised.  Follow Up Instructions:    I discussed the assessment and treatment plan with the patient. The patient was provided an opportunity to ask questions and all were answered. The patient agreed with the plan and demonstrated an understanding of the instructions.   The patient was advised to call back or seek an in-person evaluation if the symptoms worsen or if the condition fails to improve as anticipated.  I provided 26 minutes of non-face-to-face time during this encounter.   Vikki Ports, MD

## 2019-04-07 NOTE — Telephone Encounter (Signed)
Patient did request this refill, I called and asked.

## 2019-05-24 ENCOUNTER — Encounter: Payer: Self-pay | Admitting: Family Medicine

## 2019-07-01 ENCOUNTER — Other Ambulatory Visit: Payer: Self-pay | Admitting: Family Medicine

## 2019-07-01 DIAGNOSIS — F411 Generalized anxiety disorder: Secondary | ICD-10-CM

## 2019-07-01 NOTE — Telephone Encounter (Signed)
Is this okay to refill? 

## 2019-07-01 NOTE — Telephone Encounter (Signed)
Last filled 04/07/2019

## 2019-08-05 ENCOUNTER — Ambulatory Visit: Payer: Self-pay | Attending: Internal Medicine

## 2019-08-05 DIAGNOSIS — Z23 Encounter for immunization: Secondary | ICD-10-CM

## 2019-08-05 NOTE — Progress Notes (Signed)
   Covid-19 Vaccination Clinic  Name:  Robert Hatfield    MRN: [id number] DOB: 1970-06-12  08/05/2019  [name] was observed post Covid-19 immunization for 15 minutes without incident. He was provided with Vaccine Information Sheet and instruction to access the V-Safe system.   [name] was instructed to call 911 with any severe reactions post vaccine: Marland Kitchen Difficulty breathing  . Swelling of face and throat  . A fast heartbeat  . A bad rash all over body  . Dizziness and weakness   Immunizations Administered    Name Date Dose VIS Date Route   Pfizer COVID-19 Vaccine 08/05/2019 12:55 PM 0.3 mL 04/30/2019 Intramuscular   Manufacturer: [place]   Lot: [id number]   [place]: [id number]

## 2019-08-30 ENCOUNTER — Ambulatory Visit: Payer: Self-pay | Attending: Internal Medicine

## 2019-08-30 DIAGNOSIS — Z23 Encounter for immunization: Secondary | ICD-10-CM

## 2019-08-30 NOTE — Progress Notes (Signed)
   Covid-19 Vaccination [facility]  Name:  Robert Hatfield    MRN: [id number] DOB: [birth year]  08/30/2019  [name] was observed post Covid-19 immunization for 15 minutes without incident. He was provided with Vaccine Information Sheet and instruction to access the V-Safe system.   [name] was instructed to call 911 with any severe reactions post vaccine: Marland Kitchen Difficulty breathing  . Swelling of face and throat  . A fast heartbeat  . A bad rash all over body  . Dizziness and weakness   Immunizations Administered    Name Date Dose VIS Date Route   Pfizer COVID-19 Vaccine 08/30/2019  2:19 PM 0.3 mL 04/30/2019 Intramuscular   Manufacturer: [employer], [employer]   Lot: [id number]   [place]: [id number]

## 2019-09-24 ENCOUNTER — Other Ambulatory Visit: Payer: Self-pay | Admitting: Family Medicine

## 2019-09-24 DIAGNOSIS — F411 Generalized anxiety disorder: Secondary | ICD-10-CM

## 2019-09-24 NOTE — Telephone Encounter (Signed)
Is this okay to refill? 

## 2019-09-24 NOTE — Telephone Encounter (Signed)
Chart reviewed.  Last filled 2/11.  Appt scheduled for end of June.

## 2019-11-16 DIAGNOSIS — M5412 Radiculopathy, cervical region: Secondary | ICD-10-CM | POA: Diagnosis not present

## 2019-11-16 NOTE — Progress Notes (Signed)
Chief Complaint  Patient presents with  . Annual Exam    fasting annual exam. No new concerns. Has not seen eye doctor.    Robert Hatfield is a 49 y.o. male who presents for a complete physical.  He has the following concerns:  L shoulder/arm/hand pain for at least 10 days, with some recurrent numbness. H/o bulging cervical disks, treated with nerve block in the past, which worked for 11 years.  Saw Dr. Nelva Bush yesterday, going to be scheduled for an injection.  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 Started when he had milkshakes every night with his kids. He takes Lactaid supplements, which help. Has problems with milk, ice cream, and cheese if it is a lot. His current regimen is working--uses lactaid more regularly, seems to work better than if he takes it just prior to eating milk product.  He was last seen for abscess R axilla in 01/2019.  Hasn't had any recurrent problems. Still feels a residual cyst, nontender.  Anxiety:  He stopped taking lexapro and buspar last summer (12/2018).  At his September visit he reported he was doing well.  He had run out of xanax, reported only having had a few days where he felt like he needed a xanax. He did get some counseling; once he left his job and father passed away, things got easier.  Wife is stable, getting immunotherapy monthly, doing well. Alprazolam was last filled #45 on 09/24/19, getting filled every 2-3 mos. Might need xanax 2x/week (extra layers of stress related to work, usually when home).  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  Vitamin D deficiency.  Last level was 24.4 in 10/2018.  He completed 12 weeks of Rx D and had been taking MVI daily, but got off track in the last 2 weeks, taking only sporadically.  He was noted to have a small umbilical and ventral hernia at prior physicals. He sees it when he exercises, but since losing weight, no longer gets any  discomfort.  Mildly elevated cholesterol in the past. He eats red meat at least 1-2x/week, +cheese; 9 eggs/week. Sweet tooth since quitting drinking.  +cookies/sweets at night. He has been on vacation for the last 2-3 weeks, so some change in his diet. No fast food or soda. Last lipids: Lab Results  Component Value Date   CHOL 219 (H) 11/11/2018   HDL 59 11/11/2018   LDLCALC 135 (H) 11/11/2018   TRIG 126 11/11/2018   CHOLHDL 3.7 11/11/2018    Immunization History  Administered Date(s) Administered  . Influenza,inj,Quad PF,6+ Mos 05/30/2014, 02/24/2018, 02/11/2019  . PFIZER SARS-COV-2 Vaccination 08/05/2019, 08/30/2019  . Td 10/08/2000  . Tdap 04/09/2012   Last colonoscopy: never Last PSA:  Lab Results  Component Value Date   PSA1 1.3 11/11/2018   PSA1 1.3 11/06/2017   PSA 1.62 05/30/2014   PSA 1.32 06/17/2011   Dentist: twice yearly Ophtho: never.Uses reading glasses Exercise: 3 days/week of weights/push-ups/sit-ups, TRX straps and walking. Golfs in the summer. Edible marijuana a few times/week  He last donated blood about 10 years ago (so therefore screened for HIV/HepC)   PMH, PSH, SH reviewed  Outpatient Encounter Medications as of 11/17/2019  Medication Sig Note  . ALPRAZolam (XANAX) 0.5 MG tablet TAKE 1/2 TO 1 TABLET THREE TIMES DAILY AS NEEDED FOR ANXIETY. 11/17/2019: Uses prn, about 2-3/week, full tablet  . Ibuprofen-Acetaminophen (ADVIL DUAL ACTION) 125-250 MG TABS Take  2 tablets by mouth as needed. 11/17/2019: Uses prn breakthrough pain  . naproxen sodium (ALEVE) 220 MG tablet Take 440 mg by mouth 2 (two) times daily as needed. 11/17/2019: Taking BID regularly x 10 days for shoulder pain  . Multiple Vitamin (MULTIVITAMIN) tablet Take 1 tablet by mouth daily. (Patient not taking: Reported on 11/17/2019) 11/17/2019: Not in the last 2 weeks, had been taking regularly previously  . [DISCONTINUED] HYDROcodone-acetaminophen (NORCO) 10-325 MG tablet Take by mouth.  (Patient not taking: Reported on 11/17/2019)    No facility-administered encounter medications on file as of 11/17/2019.   No Known Allergies  ROS: The patient denies anorexia, fever, headaches,vision loss, decreased hearing, ear pain, hoarseness, chest pain, palpitations, dizziness, syncope, dyspnea on exertion, cough, swelling, nausea, vomiting, diarrhea, constipation, abdominal pain, melena, hematochezia, indigestion/heartburn, hematuria, incontinence, erectile dysfunction, nocturia, weakened urine stream, dysuria, genital lesions, tremor, suspicious skin lesions, depression, anxiety, abnormal bleeding/bruising, or enlarged lymph nodes. Denies any current allergy symptoms/congestion. Numbness in both feet, and foot discomfort (discomfort improved with shoe inserts)--neuropathy is improved, but still has some numbness/tingling.  Denies any burning pain.  Sometimes is proprioceptive as well. No longer has any discomfort from hernias. Diarrhea from lactose-intolerance, controlled/managed with lactaid and diet.  No blood in the stool. L neck/shoulder/arm pain per HPI.   PHYSICAL EXAM:  BP 104/78   Pulse 76   Ht 6' (1.829 m)   Wt 185 lb (83.9 kg)   BMI 25.09 kg/m   Wt Readings from Last 3 Encounters:  11/17/19 185 lb (83.9 kg)  04/07/19 189 lb (85.7 kg)  02/11/19 194 lb 3.2 oz (88.1 kg)    General Appearance:   Alert, cooperative, no distress, appears stated age. Appears to be mild discomfort at his left shoulder (holding it periodically)  Head:   Normocephalic, without obvious abnormality, atraumatic  Eyes:   PERRL, conjunctiva/corneas clear, EOM's intact, fundi benign  Ears:   Normal TM's and external ear canals  Nose:  Not examined, wearing mask due to COVID-19 pandemic  Throat:  Not examined, wearing mask due to COVID-19 pandemic  Neck:  Supple, no lymphadenopathy; thyroid: noenlargement/ tenderness/nodules; no carotid bruit or JVD. No c-spine tenderness or  spasm, minimally tender at L trapezius  Back:  Spine nontender, no curvature, ROM normal, no CVA tenderness  Lungs:   Clear to auscultation bilaterally without wheezes, rales or ronchi; respirations unlabored  Chest Wall:   No tenderness or deformity  Heart:   Regular rate and rhythm, S1 and S2 normal, no murmur, rub  or gallop  Breast Exam:   No chest wall tenderness, masses or gynecomastia  Abdomen:   Soft, non-tender, nondistended, normoactive bowel sounds,   no masses, no hepatosplenomegaly. Minimal diastasis recti, nontender. Small umbilical hernia, easily reducible, nontender.  Genitalia:   Normal male external genitalia without lesions. Testicles without masses. No inguinal hernias.  Rectal:   Normal sphincter tone, no masses or tenderness; guaiac negative stool. Prostate smooth, no nodules, not enlarged.  Extremities:  No clubbing, cyanosis or edema  Pulses:  2+ and symmetric all extremities  Skin:  Skin color, texture, turgor normal, no rashes or lesions. nontender residual sebaceous cyst in R axilla.  Also has firm cyst at R groin, nontender  Lymph nodes:  Cervical, supraclavicular, and axillary nodes normal  Neurologic:  Normal strength and gait; DTRs symmetric. Subjectively  decreased sensation over toes     Psych: Normal mood, affect, hygiene and grooming   ASSESSMENT/PLAN:  Annual physical exam - Plan: CBC  with Differential/Platelet, Comprehensive metabolic panel, Lipid panel, VITAMIN D 25 Hydroxy (Vit-D Deficiency, Fractures), Hemoglobin A1c, Vitamin B12, PSA, Visual acuity screening  Vitamin D deficiency - Recheck level; inconsistent in taking supplement x 2 weeks - Plan: VITAMIN D 25 Hydroxy (Vit-D Deficiency, Fractures)  Screening for prostate cancer - Plan: PSA  Family history of prostate cancer - Plan: PSA  Pure hypercholesterolemia - reviewed low cholesterol diet.  If LDL higher, to cut back on  egg yolks - Plan: Lipid panel  Generalized anxiety disorder - much improved; uses xanax prn, no longer daily. Encouraged mindfulness techniques rather than alprazolam; f/u if needing more regularly  Need for shingles vaccine - risks/SE reviewed.  return in 2 mos for 2nd - Plan: Varicella-zoster vaccine IM (Shingrix)  Colon cancer screening - refer for colonoscopy.  Discussed options including Cologuard - Plan: Ambulatory referral to Gastroenterology  Counseled re: marijuana use.  PSA, D, CBC, c-met, lipids TSH/B12 if sx (+ongoing neuropathy, mild; weight loss)   Discussed PSA screening (risks/benefits). Recommended at least 30 minutes of aerobic activity at least 5 days/week, weight-bearing exercise at least 2x/week; proper sunscreen use reviewed; healthy diet and alcohol recommendations (less than or equal to 2 drinks/day) reviewed; regular seatbelt use; changing batteries in smoke detectors, use of carbon monoxide detectors. Immunization recommendations discussed--yearly flu shots recommended. Shingrix recommended, first dose given today.  Risks/SE reviewed.  Colon cancer screening recommendations reviewed, due now. Referred for colonoscopy.   F/u 1 year for CPE; [place] 2 mos for 2nd Shingrix. F/u sooner prn.

## 2019-11-16 NOTE — Patient Instructions (Addendum)
  HEALTH MAINTENANCE RECOMMENDATIONS:  It is recommended that you get at least 30 minutes of aerobic exercise at least 5 days/week (for weight loss, you may need as much as 60-90 minutes). This can be any activity that gets your heart rate up. This can be divided in 10-15 minute intervals if needed, but try and build up your endurance at least once a week.  Weight bearing exercise is also recommended twice weekly.  Eat a healthy diet with lots of vegetables, fruits and fiber.  "Colorful" foods have a lot of vitamins (ie green vegetables, tomatoes, red peppers, etc).  Limit sweet tea, regular sodas and alcoholic beverages, all of which has a lot of calories and sugar.  Up to 2 alcoholic drinks daily may be beneficial for men (unless trying to lose weight, watch sugars).  Drink a lot of water.  Sunscreen of at least SPF 30 should be used on all sun-exposed parts of the skin when outside between the hours of 10 am and 4 pm (not just when at beach or pool, but even with exercise, golf, tennis, and yard work!)  Use a sunscreen that says "broad spectrum" so it covers both UVA and UVB rays, and make sure to reapply every 1-2 hours.  Remember to change the batteries in your smoke detectors when changing your clock times in the spring and fall. Carbon monoxide detectors are recommended for your home.  Use your seat belt every time you are in a car, and please drive safely and not be distracted with cell phones and texting while driving.  Return in 2 months for your 2nd Shingrix vaccine  Please schedule a routine eye exam.  Colon cancer screening is due. We are referring you to [facility] (Dr. Hilarie Fredrickson) for colonoscopy.  Take the Aleve with food.  Do NOT use the advil dual action between doses--only use tylenol if additional pain medication is required. You likely will be told to stop the Aleve (and advil) prior to your injection. I hope your shoulder/arm feels better soon.

## 2019-11-17 ENCOUNTER — Other Ambulatory Visit: Payer: Self-pay

## 2019-11-17 ENCOUNTER — Encounter: Payer: Self-pay | Admitting: Family Medicine

## 2019-11-17 ENCOUNTER — Ambulatory Visit (INDEPENDENT_AMBULATORY_CARE_PROVIDER_SITE_OTHER): Payer: BC Managed Care – PPO | Admitting: Family Medicine

## 2019-11-17 VITALS — BP 104/78 | HR 76 | Ht 72.0 in | Wt 185.0 lb

## 2019-11-17 DIAGNOSIS — Z1211 Encounter for screening for malignant neoplasm of colon: Secondary | ICD-10-CM

## 2019-11-17 DIAGNOSIS — Z23 Encounter for immunization: Secondary | ICD-10-CM

## 2019-11-17 DIAGNOSIS — Z8042 Family history of malignant neoplasm of prostate: Secondary | ICD-10-CM

## 2019-11-17 DIAGNOSIS — Z0000 Encounter for general adult medical examination without abnormal findings: Secondary | ICD-10-CM | POA: Diagnosis not present

## 2019-11-17 DIAGNOSIS — Z125 Encounter for screening for malignant neoplasm of prostate: Secondary | ICD-10-CM

## 2019-11-17 DIAGNOSIS — E559 Vitamin D deficiency, unspecified: Secondary | ICD-10-CM | POA: Diagnosis not present

## 2019-11-17 DIAGNOSIS — E7800 Pure hypercholesterolemia, unspecified: Secondary | ICD-10-CM

## 2019-11-17 DIAGNOSIS — F411 Generalized anxiety disorder: Secondary | ICD-10-CM

## 2019-11-18 LAB — CBC WITH DIFFERENTIAL/PLATELET
Basophils Absolute: 0 10*3/uL (ref 0.0–0.2)
Basos: 1 %
EOS (ABSOLUTE): 0.1 10*3/uL (ref 0.0–0.4)
Eos: 2 %
Hematocrit: 46.7 % (ref 37.5–51.0)
Hemoglobin: 16 g/dL (ref 13.0–17.7)
Immature Grans (Abs): 0 10*3/uL (ref 0.0–0.1)
Immature Granulocytes: 0 %
Lymphocytes Absolute: 2.3 10*3/uL (ref 0.7–3.1)
Lymphs: 44 %
MCH: 30.5 pg (ref 26.6–33.0)
MCHC: 34.3 g/dL (ref 31.5–35.7)
MCV: 89 fL (ref 79–97)
Monocytes Absolute: 0.4 10*3/uL (ref 0.1–0.9)
Monocytes: 7 %
Neutrophils Absolute: 2.3 10*3/uL (ref 1.4–7.0)
Neutrophils: 46 %
Platelets: 300 10*3/uL (ref 150–450)
RBC: 5.24 x10E6/uL (ref 4.14–5.80)
RDW: 13.5 % (ref 11.6–15.4)
WBC: 5.1 10*3/uL (ref 3.4–10.8)

## 2019-11-18 LAB — COMPREHENSIVE METABOLIC PANEL
ALT: 20 IU/L (ref 0–44)
AST: 21 IU/L (ref 0–40)
Albumin/Globulin Ratio: 2 (ref 1.2–2.2)
Albumin: 4.5 g/dL (ref 4.0–5.0)
Alkaline Phosphatase: 75 IU/L (ref 48–121)
BUN/Creatinine Ratio: 19 (ref 9–20)
BUN: 16 mg/dL (ref 6–24)
Bilirubin Total: 0.5 mg/dL (ref 0.0–1.2)
CO2: 23 mmol/L (ref 20–29)
Calcium: 9.5 mg/dL (ref 8.7–10.2)
Chloride: 102 mmol/L (ref 96–106)
Creatinine, Ser: 0.83 mg/dL (ref 0.76–1.27)
GFR calc Af Amer: 119 mL/min/{1.73_m2} (ref >59)
GFR calc non Af Amer: 103 mL/min/{1.73_m2} (ref >59)
Globulin, Total: 2.2 g/dL (ref 1.5–4.5)
Glucose: 90 mg/dL (ref 65–99)
Potassium: 4.8 mmol/L (ref 3.5–5.2)
Sodium: 138 mmol/L (ref 134–144)
Total Protein: 6.7 g/dL (ref 6.0–8.5)

## 2019-11-18 LAB — PSA: Prostate Specific Ag, Serum: 1.7 ng/mL (ref 0.0–4.0)

## 2019-11-18 LAB — LIPID PANEL
Chol/HDL Ratio: 4 ratio (ref 0.0–5.0)
Cholesterol, Total: 227 mg/dL — ABNORMAL HIGH (ref 100–199)
HDL: 57 mg/dL (ref >39)
LDL Chol Calc (NIH): 153 mg/dL — ABNORMAL HIGH (ref 0–99)
Triglycerides: 96 mg/dL (ref 0–149)
VLDL Cholesterol Cal: 17 mg/dL (ref 5–40)

## 2019-11-18 LAB — VITAMIN B12: Vitamin B-12: 223 pg/mL — ABNORMAL LOW (ref 232–1245)

## 2019-11-18 LAB — HEMOGLOBIN A1C
Est. average glucose Bld gHb Est-mCnc: 108 mg/dL
Hgb A1c MFr Bld: 5.4 % (ref 4.8–5.6)

## 2019-11-18 LAB — VITAMIN D 25 HYDROXY (VIT D DEFICIENCY, FRACTURES): Vit D, 25-Hydroxy: 27.2 ng/mL — ABNORMAL LOW (ref 30.0–100.0)

## 2019-11-25 DIAGNOSIS — M5412 Radiculopathy, cervical region: Secondary | ICD-10-CM | POA: Diagnosis not present

## 2019-12-16 ENCOUNTER — Other Ambulatory Visit: Payer: Self-pay | Admitting: *Deleted

## 2019-12-16 ENCOUNTER — Other Ambulatory Visit: Payer: Self-pay | Admitting: Family Medicine

## 2019-12-16 DIAGNOSIS — F411 Generalized anxiety disorder: Secondary | ICD-10-CM

## 2019-12-16 DIAGNOSIS — E538 Deficiency of other specified B group vitamins: Secondary | ICD-10-CM

## 2019-12-16 NOTE — Telephone Encounter (Signed)
Is this okay to refill? 

## 2019-12-27 ENCOUNTER — Encounter: Payer: Self-pay | Admitting: Gastroenterology

## 2020-01-17 ENCOUNTER — Other Ambulatory Visit: Payer: BC Managed Care – PPO

## 2020-02-24 ENCOUNTER — Encounter: Payer: Self-pay | Admitting: Gastroenterology

## 2020-03-20 ENCOUNTER — Other Ambulatory Visit: Payer: BC Managed Care – PPO

## 2020-03-20 ENCOUNTER — Other Ambulatory Visit: Payer: Self-pay

## 2020-03-20 DIAGNOSIS — E538 Deficiency of other specified B group vitamins: Secondary | ICD-10-CM

## 2020-03-21 LAB — VITAMIN B12: Vitamin B-12: 214 pg/mL — ABNORMAL LOW (ref 232–1245)

## 2020-03-22 ENCOUNTER — Other Ambulatory Visit: Payer: Self-pay | Admitting: Family Medicine

## 2020-03-22 DIAGNOSIS — F411 Generalized anxiety disorder: Secondary | ICD-10-CM

## 2020-03-22 NOTE — Telephone Encounter (Signed)
Is this okay to refill? 

## 2020-04-07 DIAGNOSIS — M542 Cervicalgia: Secondary | ICD-10-CM | POA: Diagnosis not present

## 2020-04-07 DIAGNOSIS — M5412 Radiculopathy, cervical region: Secondary | ICD-10-CM | POA: Diagnosis not present

## 2020-04-17 ENCOUNTER — Encounter: Payer: Self-pay | Admitting: Family Medicine

## 2020-04-18 ENCOUNTER — Ambulatory Visit (INDEPENDENT_AMBULATORY_CARE_PROVIDER_SITE_OTHER): Payer: BC Managed Care – PPO

## 2020-04-18 ENCOUNTER — Other Ambulatory Visit: Payer: Self-pay

## 2020-04-18 DIAGNOSIS — Z23 Encounter for immunization: Secondary | ICD-10-CM | POA: Diagnosis not present

## 2020-04-27 DIAGNOSIS — M5412 Radiculopathy, cervical region: Secondary | ICD-10-CM | POA: Diagnosis not present

## 2020-05-26 ENCOUNTER — Other Ambulatory Visit: Payer: Self-pay | Admitting: Family Medicine

## 2020-05-26 DIAGNOSIS — F411 Generalized anxiety disorder: Secondary | ICD-10-CM

## 2020-05-26 NOTE — Telephone Encounter (Signed)
[place] is requesting to fill pt xanax. Please advise [facility]

## 2020-05-26 NOTE — Telephone Encounter (Signed)
Refill sent. Chart reviewed--his B12 was low.  His last message said he would get B12 shot and booster.  I see he had COVID booster, but can't see that he got B12 shot.  See result note for recommendations on how B12 shots are recommended, and see if he needs to be scheduled for that regimen.  Thanks

## 2020-05-29 NOTE — Telephone Encounter (Signed)
I called patient and let him know that xanax was refilled. Also asked him to please call me back to discuss scheduling B12 injections: once weekly for one month, then every 2 weeks for a month, then monthly, ongoing. To have a B12 level drawn at the 3 month mark.

## 2020-06-07 DIAGNOSIS — H53143 Visual discomfort, bilateral: Secondary | ICD-10-CM | POA: Diagnosis not present

## 2020-06-07 DIAGNOSIS — H5203 Hypermetropia, bilateral: Secondary | ICD-10-CM | POA: Diagnosis not present

## 2020-07-24 ENCOUNTER — Telehealth: Payer: Self-pay | Admitting: *Deleted

## 2020-07-24 ENCOUNTER — Other Ambulatory Visit: Payer: Self-pay | Admitting: Family Medicine

## 2020-07-24 DIAGNOSIS — F411 Generalized anxiety disorder: Secondary | ICD-10-CM

## 2020-07-24 NOTE — Telephone Encounter (Signed)
Patient did request this refill.

## 2020-07-24 NOTE — Telephone Encounter (Signed)
Just an FYI when I spoke with patient he wanted to let us know that he never took any OTC B12 at all. He will get some and would like to have his B12 level checked at his CPE beginning of July.

## 2020-09-08 ENCOUNTER — Encounter: Payer: Self-pay | Admitting: Internal Medicine

## 2020-09-18 ENCOUNTER — Other Ambulatory Visit: Payer: Self-pay | Admitting: Family Medicine

## 2020-09-18 DIAGNOSIS — F411 Generalized anxiety disorder: Secondary | ICD-10-CM

## 2020-09-18 NOTE — Telephone Encounter (Signed)
Last filled early March (about q2 mos).  Has appt scheduled for July. He is due for labs, but can wait and do in 2 months.

## 2020-09-18 NOTE — Telephone Encounter (Signed)
Is this okay to refill? 

## 2020-09-20 ENCOUNTER — Ambulatory Visit: Payer: BC Managed Care – PPO

## 2020-09-26 ENCOUNTER — Other Ambulatory Visit: Payer: Self-pay

## 2020-09-26 ENCOUNTER — Ambulatory Visit (INDEPENDENT_AMBULATORY_CARE_PROVIDER_SITE_OTHER): Payer: BC Managed Care – PPO

## 2020-09-26 DIAGNOSIS — Z23 Encounter for immunization: Secondary | ICD-10-CM

## 2020-09-27 ENCOUNTER — Encounter: Payer: Self-pay | Admitting: Podiatry

## 2020-09-27 ENCOUNTER — Ambulatory Visit (INDEPENDENT_AMBULATORY_CARE_PROVIDER_SITE_OTHER): Payer: BC Managed Care – PPO | Admitting: Podiatry

## 2020-09-27 ENCOUNTER — Ambulatory Visit (INDEPENDENT_AMBULATORY_CARE_PROVIDER_SITE_OTHER): Payer: BC Managed Care – PPO

## 2020-09-27 DIAGNOSIS — M79671 Pain in right foot: Secondary | ICD-10-CM | POA: Diagnosis not present

## 2020-09-27 DIAGNOSIS — M778 Other enthesopathies, not elsewhere classified: Secondary | ICD-10-CM

## 2020-09-27 DIAGNOSIS — M205X1 Other deformities of toe(s) (acquired), right foot: Secondary | ICD-10-CM | POA: Diagnosis not present

## 2020-09-27 DIAGNOSIS — M79672 Pain in left foot: Secondary | ICD-10-CM

## 2020-09-27 DIAGNOSIS — G629 Polyneuropathy, unspecified: Secondary | ICD-10-CM

## 2020-09-27 MED ORDER — TRIAMCINOLONE ACETONIDE 10 MG/ML IJ SUSP
10.0000 mg | Freq: Once | INTRAMUSCULAR | Status: AC
Start: 2020-09-27 — End: 2020-09-27
  Administered 2020-09-27: 10 mg

## 2020-09-27 NOTE — Progress Notes (Signed)
Subjective:   Patient ID: Robert Hatfield, male   DOB: 50 y.o.   MRN: [id number]   HPI Patient states has had a number of years history of pain in both his feet with the right being worse than the left and states he gets tingling pains in his toes and his pain that he has had around his big toe joint of his right foot that is been bothersome and states he also gets pain on the outside of his feet.  Patient does not smoke and is active   Review of Systems  All other systems reviewed and are negative.       Objective:  Physical Exam Vitals and nursing note reviewed.  Constitutional:      Appearance: He is well-developed.  Pulmonary:     Effort: Pulmonary effort is normal.  Musculoskeletal:        General: Normal range of motion.  Skin:    General: Skin is warm.  Neurological:     Mental Status: He is alert.     Neurovascular status intact muscle strength adequate range of motion adequate with patient found to have enlargement with spur formation around the big toe joint right with reduced range of motion of the joint surface and inflammation noted.  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

## 2020-09-27 NOTE — Patient Instructions (Signed)
Hallux Rigidus  Hallux rigidus is a type of joint pain or joint disease (arthritis) that affects your big toe (hallux). This condition involves the joint that connects the base of your big toe to the main part of your foot (metatarsophalangeal joint or MTP joint). This condition can cause your big toe to become stiff, painful, and difficult to move. Symptoms may get worse with movement or in cold or damp weather. The condition gets worse over time. What are the causes? This condition may be caused by having a foot that does not function the way that it should or that has an abnormal shape (structural deformity). These foot problems can run in families and may be passed down from parents to children (are hereditary). This condition can also be caused by:  Injury.  Overuse.  Certain inflammatory diseases, including gout and rheumatoid arthritis. What increases the risk? You are more likely to develop this condition if you have:  A foot bone (metatarsal) that is longer or higher than normal.  A family history of hallux rigidus.  Previously injured your big toe.  Feet that do not have a curve (arch) on the inner side of the foot. This may be called flat feet or fallen arches.  Ankles that turn in when you walk (pronation).  Rheumatoid arthritis or gout.  A job that requires you to stoop down often at work. What are the signs or symptoms? Symptoms of this condition include:  Big toe pain.  Stiffness and difficulty moving the big toe.  Swelling of the toe and surrounding area.  Bone spurs. These are bony growths that can form on the joint of the big toe.  A limp. How is this diagnosed? This condition is diagnosed based on your medical history and a physical exam. You may also have X-rays. How is this treated? This condition is treated by:  Wearing roomy, comfortable shoes that have a large toe box.  Putting orthotic devices in your shoes.  Taking pain medicines.  Having  physical therapy.  Icing the injured area.  Alternating between putting your foot in cold water and then in warm water. If your condition is severe, treatment may include:  Corticosteroid injections to relieve pain.  Surgery to remove bone spurs, fuse damaged bones together, or replace the entire joint. Follow these instructions at home: Managing pain, stiffness, and swelling  Put your feet in cold water for 30 seconds, and then in warm water for 30 seconds. Alternate between the cold and warm water for 5 minutes. Do this several times a day or as told by your health care provider.  If directed, put ice on the injured area. ? Put ice in a plastic bag. ? Place a towel between your skin and the bag. ? Leave the ice on for 20 minutes, 2-3 times a day.   General instructions  Take over-the-counter and prescription medicines only as told by your health care provider.  Do not wear high heels or other restrictive footwear. Wear comfortable, supportive shoes that have a large toe box.  Wear shoe inserts (orthotics) as told by your health care provider, if this applies.  Do foot exercises as instructed by your health care provider or a physical therapist.  Keep all follow-up visits as told by your health care provider. This is important. Contact a health care provider if:  You notice bone spurs or growths on or around your big toe.  Your pain does not get better or it gets worse.  You   have pain while resting.  You have pain in other parts of your body, such as your back, hip, or knee.  You start to limp. Summary  Hallux rigidus is a condition that makes your big toe become stiff, painful, and difficult to move.  It can be caused by injury, overuse, or inflammatory diseases.  This condition may be treated with ice, medicines, physical therapy, and surgery.  Do not wear high heels or other restrictive footwear. Wear comfortable, supportive shoes that have a large toe box. This  information is not intended to replace advice given to you by your health care provider. Make sure you discuss any questions you have with your health care provider. Document Revised: 02/13/2018 Document Reviewed: 02/16/2018 Elsevier Patient Education  2021 Elsevier Inc.  

## 2020-10-18 ENCOUNTER — Other Ambulatory Visit: Payer: Self-pay

## 2020-10-18 ENCOUNTER — Ambulatory Visit (INDEPENDENT_AMBULATORY_CARE_PROVIDER_SITE_OTHER): Payer: BC Managed Care – PPO | Admitting: Podiatry

## 2020-10-18 ENCOUNTER — Encounter: Payer: Self-pay | Admitting: Podiatry

## 2020-10-18 DIAGNOSIS — M205X1 Other deformities of toe(s) (acquired), right foot: Secondary | ICD-10-CM | POA: Diagnosis not present

## 2020-10-18 DIAGNOSIS — M778 Other enthesopathies, not elsewhere classified: Secondary | ICD-10-CM | POA: Diagnosis not present

## 2020-10-18 NOTE — Progress Notes (Signed)
Subjective:   Patient ID: Robert Hatfield, male   DOB: 50 y.o.   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  At this point were going to continue with conservative care and I went ahead and casted him for functional orthotics with a reverse Morton's extension right to try to reduce stress on the joint and we will keep an eye on this in the future again with probable ultimate surgery at 1 point in future

## 2020-11-02 ENCOUNTER — Ambulatory Visit (AMBULATORY_SURGERY_CENTER): Payer: BC Managed Care – PPO

## 2020-11-02 ENCOUNTER — Other Ambulatory Visit: Payer: Self-pay | Admitting: Family Medicine

## 2020-11-02 ENCOUNTER — Other Ambulatory Visit: Payer: Self-pay

## 2020-11-02 VITALS — Ht 72.0 in | Wt 180.0 lb

## 2020-11-02 DIAGNOSIS — Z1211 Encounter for screening for malignant neoplasm of colon: Secondary | ICD-10-CM

## 2020-11-02 DIAGNOSIS — F411 Generalized anxiety disorder: Secondary | ICD-10-CM

## 2020-11-02 MED ORDER — NA SULFATE-K SULFATE-MG SULF 17.5-3.13-1.6 GM/177ML PO SOLN: 1.0000 | Freq: Once | ORAL | 0 refills | Status: AC

## 2020-11-02 NOTE — Telephone Encounter (Signed)
He has appt 7/6. This request is coming earlier than usual.  His supply usually lasts for 2 months.  This is 2 weeks early.  Does he truly need now (vs waiting until visit), and what is reason for increased usage, if truly needed.

## 2020-11-02 NOTE — Progress Notes (Signed)

## 2020-11-02 NOTE — Telephone Encounter (Signed)
Received request for a refill on the pts. Alprazolam pt. Last apt was 11/17/19 and next apt is 11/22/20.

## 2020-11-02 NOTE — Telephone Encounter (Signed)
Refilled this time. Will discuss in detail at his visit next month.  I will not refill further without a visit, if he cancels his visit. Sending it to [place] as [place], in case he cancels in July.

## 2020-11-03 ENCOUNTER — Telehealth: Payer: Self-pay | Admitting: Podiatry

## 2020-11-03 NOTE — Telephone Encounter (Signed)
Orthotics in.. lvm for pt to call to schedule an appt to pick them up. °

## 2020-11-06 ENCOUNTER — Other Ambulatory Visit: Payer: Self-pay | Admitting: Family Medicine

## 2020-11-06 DIAGNOSIS — F411 Generalized anxiety disorder: Secondary | ICD-10-CM

## 2020-11-06 MED ORDER — ALPRAZOLAM 0.5 MG PO TABS: 0.2500 mg | ORAL_TABLET | Freq: Three times a day (TID) | ORAL | 0 refills | Status: DC | PRN

## 2020-11-16 ENCOUNTER — Ambulatory Visit (AMBULATORY_SURGERY_CENTER): Payer: BC Managed Care – PPO | Admitting: Internal Medicine

## 2020-11-16 ENCOUNTER — Other Ambulatory Visit: Payer: Self-pay

## 2020-11-16 ENCOUNTER — Encounter: Payer: Self-pay | Admitting: Internal Medicine

## 2020-11-16 VITALS — BP 102/75 | HR 60 | Temp 98.1°F | Resp 17 | Ht 72.0 in | Wt 180.0 lb

## 2020-11-16 DIAGNOSIS — D122 Benign neoplasm of ascending colon: Secondary | ICD-10-CM

## 2020-11-16 DIAGNOSIS — D123 Benign neoplasm of transverse colon: Secondary | ICD-10-CM | POA: Diagnosis not present

## 2020-11-16 DIAGNOSIS — Z1211 Encounter for screening for malignant neoplasm of colon: Secondary | ICD-10-CM | POA: Diagnosis not present

## 2020-11-16 DIAGNOSIS — C20 Malignant neoplasm of rectum: Secondary | ICD-10-CM

## 2020-11-16 DIAGNOSIS — D125 Benign neoplasm of sigmoid colon: Secondary | ICD-10-CM | POA: Diagnosis not present

## 2020-11-16 DIAGNOSIS — K6389 Other specified diseases of intestine: Secondary | ICD-10-CM

## 2020-11-16 DIAGNOSIS — C186 Malignant neoplasm of descending colon: Secondary | ICD-10-CM | POA: Diagnosis not present

## 2020-11-16 DIAGNOSIS — K6289 Other specified diseases of anus and rectum: Secondary | ICD-10-CM

## 2020-11-16 MED ORDER — SODIUM CHLORIDE 0.9 % IV SOLN: 500.0000 mL | Freq: Once | INTRAVENOUS | Status: DC

## 2020-11-16 NOTE — Progress Notes (Signed)
PT taken to PACU. Monitors in place. VSS. Report given to RN. 

## 2020-11-16 NOTE — Patient Instructions (Signed)
YOU HAD AN ENDOSCOPIC PROCEDURE TODAY AT THE [facility]:   Refer to the procedure report that was given to you for any specific questions about what was found during the examination.  If the procedure report does not answer your questions, please call your gastroenterologist to clarify.  If you requested that your care partner not be given the details of your procedure findings, then the procedure report has been included in a sealed envelope for you to review at your convenience later.  YOU SHOULD EXPECT: Some feelings of bloating in the abdomen. Passage of more gas than usual.  Walking can help get rid of the air that was put into your GI tract during the procedure and reduce the bloating. If you had a lower endoscopy (such as a colonoscopy or flexible sigmoidoscopy) you may notice spotting of blood in your stool or on the toilet paper. If you underwent a bowel prep for your procedure, you may not have a normal bowel movement for a few days.  Please Note:  You might notice some irritation and congestion in your nose or some drainage.  This is from the oxygen used during your procedure.  There is no need for concern and it should clear up in a day or so.  SYMPTOMS TO REPORT IMMEDIATELY:   Following lower endoscopy (colonoscopy or flexible sigmoidoscopy):  Excessive amounts of blood in the stool  Significant tenderness or worsening of abdominal pains  Swelling of the abdomen that is new, acute  Fever of 100F or higher   Following upper endoscopy (EGD)  Vomiting of blood or coffee ground material  New chest pain or pain under the shoulder blades  Painful or persistently difficult swallowing  New shortness of breath  Fever of 100F or higher  Black, tarry-looking stools  For urgent or emergent issues, a gastroenterologist can be reached at any hour by calling [phone]. Do not use MyChart messaging for urgent concerns.    DIET:  We do recommend a small meal at first, but  then you may proceed to your regular diet.  Drink plenty of fluids but you should avoid alcoholic beverages for 24 hours.  ACTIVITY:  You should plan to take it easy for the rest of today and you should NOT DRIVE or use heavy machinery until tomorrow (because of the sedation medicines used during the test).    FOLLOW UP: Our staff will call the number listed on your records 48-72 hours following your procedure to check on you and address any questions or concerns that you may have regarding the information given to you following your procedure. If we do not reach you, we will leave a message.  We will attempt to reach you two times.  During this call, we will ask if you have developed any symptoms of COVID 19. If you develop any symptoms (ie: fever, flu-like symptoms, shortness of breath, cough etc.) before then, please call [phone].  If you test positive for Covid 19 in the 2 weeks post procedure, please call and report this information to us.    If any biopsies were taken you will be contacted by phone or by letter within the next 1-3 weeks.  Please call us at [phone] if you have not heard about the biopsies in 3 weeks.    SIGNATURES/CONFIDENTIALITY: You and/or your care partner have signed paperwork which will be entered into your electronic medical record.  These signatures attest to the fact that that the information above on   your After Visit Summary has been reviewed and is understood.  Full responsibility of the confidentiality of this discharge information lies with you and/or your care-partner. 

## 2020-11-16 NOTE — Progress Notes (Signed)
Called to room to assist during endoscopic procedure.  Patient ID and intended procedure confirmed with present staff. Received instructions for my participation in the procedure from the performing physician.  

## 2020-11-16 NOTE — Op Note (Signed)
[facility] Patient Name: Robert Hatfield Procedure Date: 11/16/2020 9:36 AM MRN: [id number] Endoscopist: Docia Chuck. Henrene Pastor , MD Age: 50 Referring MD:  Date of Birth: [birth year] Gender: Male Account #: [id number] Procedure:                Colonoscopy with cold (8), hot (1) snare                            polypectomy. Biopsies, submuc. inject Indications:              Screening for colorectal malignant neoplasm Medicines:                Monitored Anesthesia Care Procedure:                Pre-Anesthesia Assessment:                           - Prior to the procedure, a History and Physical                            was performed, and patient medications and                            allergies were reviewed. The patient's tolerance of                            previous anesthesia was also reviewed. The risks                            and benefits of the procedure and the sedation                            options and risks were discussed with the patient.                            All questions were answered, and informed consent                            was obtained. Prior Anticoagulants: The patient has                            taken no previous anticoagulant or antiplatelet                            agents. ASA Grade Assessment: II - A patient with                            mild systemic disease. After reviewing the risks                            and benefits, the patient was deemed in                            satisfactory condition to undergo the procedure.  After obtaining informed consent, the colonoscope                            was passed under direct vision. Throughout the                            procedure, the patient's blood pressure, pulse, and                            oxygen saturations were monitored continuously. The                            CF [id number] #[id number] was introduced through the anus                            and  advanced to the the cecum, identified by                            appendiceal orifice and ileocecal valve. The                            ileocecal valve, appendiceal orifice, and rectum                            were photographed. The quality of the bowel                            preparation was excellent. The colonoscopy was                            performed without difficulty. The patient tolerated                            the procedure well. The bowel preparation used was                            SUPREP via split dose instruction. Scope In: 9:52:53 AM Scope Out: 10:54:48 AM Scope Withdrawal Time: 0 hours 49 minutes 1 second  Total Procedure Duration: 1 hour 1 minute 55 seconds  Findings:                 Eight polyps were found in the transverse colon and                            ascending colon. The polyps were 2 to 8 mm in size.                            These polyps were removed with a cold snare.                            Resection and retrieval were complete.                           A 20 mm polyp  was found in the sigmoid colon at 30                            cm from the anal verge. The polyp was pedunculated.                            The polyp was removed with a hot snare. Resection                            and retrieval Jabier Mutton net) were complete.                           A non-obstructing mass was found in the descending                            colon at approximately 40 cm from the anal verge.                            This measured 2.5 x 1.5 cm. See image. This was                            biopsied with a cold forceps for histology. Area                            was tattooed with an injection of Spot (carbon                            black).                           A large lateral spreading mass was found in the                            rectum. This began at 4 cm from the anal verge. The                            lesion was completely  circumferential, bulky, but                            soft. This extended proximal to about 15 cm from                            the anal verge. This was biopsied with a cold                            forceps for histology. Area was tattooed with an                            injection of Spot (carbon black) above the most                            proximal margin. See images.  Diverticula were found in the left colon.                           The exam was otherwise without abnormality on                            direct and retroflexion views. Complications:            No immediate complications. Estimated blood loss:                            None. Estimated Blood Loss:     Estimated blood loss: none. Impression:               - Eight 2 to 8 mm polyps in the transverse colon                            and in the ascending colon, removed with a cold                            snare. Resected and retrieved.                           - One 20 mm polyp in the sigmoid colon, removed                            with a hot snare. Resected and retrieved.                           - Likely malignant tumor in the descending colon.                            Biopsied. Tattooed.                           - Likely malignant tumor in the rectum. Biopsied.                            Tattooed.                           - Diverticulosis in the left colon.                           - The examination was otherwise normal on direct                            and retroflexion views. Recommendation:           - Repeat colonoscopy date to be determined after                            pending pathology results are reviewed for                            surveillance.                           -  Patient has a contact number available for                            emergencies. The signs and symptoms of potential                            delayed complications were discussed  with the                            patient. Return to normal activities tomorrow.                            Written discharge instructions were provided to the                            patient.                           - Resume previous diet.                           - Continue present medications.                           - Await pathology results. Docia Chuck. Henrene Pastor, MD 11/16/2020 11:25:09 AM This report has been signed electronically.

## 2020-11-16 NOTE — Progress Notes (Signed)
VS- Robert Hatfield  Pt's states no medical or surgical changes since previsit or office visit.  Last used edible marijuana "a week ago" per pt

## 2020-11-20 NOTE — Progress Notes (Signed)
Chief Complaint  Patient presents with   Annual Exam    CPE may want to discuss recent results that were not to good, wasn't sure if he had flu or shingrix agreed to get shingrix shot here today.     Robert Hatfield is a 50 y.o. male who presents for a complete physical.  He has the following concerns:  He had colonoscopy on 6/30 which revealed: - Eight 2 to 8 mm polyps in the transverse colon and in the ascending colon, removed with a cold snare. Resected and retrieved. - One 20 mm polyp in the sigmoid colon, removed with a hot snare. Resected and retrieved. - Likely malignant tumor in the descending colon. Biopsied. Tattooed. - Likely malignant tumor in the rectum. Biopsied. Tattooed. - Diverticulosis in the left colon. - The examination was otherwise normal on direct and retroflexion views.  Pathology revealed: Diagnosis 1. Ascending Colon Polyp, transverse (3) - TUBULAR ADENOMA(S) - NEGATIVE FOR HIGH-GRADE DYSPLASIA OR MALIGNANCY 2. Transverse Colon Polyp, (4) - TUBULAR ADENOMA(S) - NEGATIVE FOR HIGH-GRADE DYSPLASIA OR MALIGNANCY 3. Descending Colon Biopsy - ADENOCARCINOMA, SEE NOTE 4. Sigmoid Colon Polyp, at 30 - TUBULAR ADENOMA - NEGATIVE FOR HIGH-GRADE DYSPLASIA OR MALIGNANCY - LATERAL MUCOSAL MARGINS ARE NEGATIVE FOR DYSPLASIA. 5. 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 Dr. Gari Hatfield reviewed the case and concurs with the diagnosis. Dr. Henrene Hatfield was notified on 11/17/2020. 5. The biopsy is superficial in nature. A deeper more severe process cannot be ruled out.  He finally was able to speak to GI doctor yesterday--planning for labs today, and CT on 7/11.   He was noted to have B12 deficiency on his labs from physical 10/2019.  There was no associated anemia or macrocytosis.  He had level rechecked in November, and level was even lower.  We gave him the  following info via Robert Hatfield.  He hadn't read it, so we called but only LV to call back.  He did not call back, so we didn't get the answers to the questions or set up any injections for follow-up.  Message on labs results from 03/2020: "Your B12 level is lower than on last check. If you have been taking an oral B12 vitamin (1024mcg or 1mg  daily is the usual dose), then it isn't getting absorbed, as your levels have not improved at all. If you cannot absorb it orally, then B12 shot are recommended.  We usually start them weekly for a month, then every 2 weeks, for a month, and then usually just once a month, ongoing.  We are happy to give you the shots in the office.  If you'd like to give them to yourself, you can be shown how and we can prescribe the medicine, needles, etc to you.  We have patients do it both ways--whichever way you prefer is fine, and we can make it happen (and recheck levels with an office visit in 3 months)."  He reports today that he only took a multivitamin, never a separate B12.  In November he was good about taking the MVI, hasn't been taking it for quite a while.  Has chronic numbness in his toes, no tingling in his fingers.  Lab Results  Component Value Date   VITAMINB12 214 (L) 03/20/2020   Lab Results  Component Value Date   WBC 5.1 11/17/2019   HGB 16.0 11/17/2019   HCT 46.7 11/17/2019  MCV 89 11/17/2019   PLT 300 11/17/2019    Anxiety:  He stopped taking lexapro and buspar in 12/2018.  At his September 2020 visit he reported he was doing well.  He had run out of xanax, reported only having had a few days where he felt like he needed a xanax. He did get some counseling; once he left his job and father passed away, things got easier.  Wife is stable, getting immunotherapy monthly, doing well. 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 He has been busier at work.  He reports taking xanax usually no more than once a day, only occasionally takes it a  second time. Takes it sometimes proactively, if he knows a lot is going on. Xanax frequency was noted to increase recently. His rx for #45 used to last for 2-3 months, but last fill was <2 months.  Fill dates have been 05/26/20, 07/24/20, 09/18/20, and most recently 11/06/20, all for #45. He had gotten a little out of the habit of his regular workout routine, which would help with his anxiety.  He has been back to exercising for the last month (up until his colonoscopy).   Vitamin D deficiency.  Last level was 27.2 in 10/2019.  After his level of 24.4 in 10/2018 he was treated with 12 weeks of rx, f/b MVI daily, but had only taken the MVI sporadically for 2 weeks prior to last test. He is currently not taking any MVI or D3 supplements.  Hypercholesterolemia:  Lipids have been getting higher over the last few years.  Prior to his last check in 10/2019 (when was the highest), he had reported eating red meat at least 1-2x/week, +cheese; 9 eggs/week. He reported having a sweet tooth since quitting drinking.  +cookies/sweets at night. He had been on vacation for 2-3 weeks prior to last check, some change in his diet. No fast food or soda. Current diet--some more fast food (due to being busy with work, teenagers at home). Red meat at least 1x/week, max 3.  6-9 eggs/week, +cheese. He is due for recheck today. Last lipids: Lab Results  Component Value Date   CHOL 227 (H) 11/17/2019   CHOL 219 (H) 11/11/2018   CHOL 204 (H) 11/06/2017   Lab Results  Component Value Date   HDL 57 11/17/2019   HDL 59 11/11/2018   HDL 52 11/06/2017   Lab Results  Component Value Date   LDLCALC 153 (H) 11/17/2019   LDLCALC 135 (H) 11/11/2018   LDLCALC 130 (H) 11/06/2017   Lab Results  Component Value Date   TRIG 96 11/17/2019   TRIG 126 11/11/2018   TRIG 110 11/06/2017   Lab Results  Component Value Date   CHOLHDL 4.0 11/17/2019   CHOLHDL 3.7 11/11/2018   CHOLHDL 3.9 11/06/2017   He saw podiatrist in May (Dr.  Paulla Hatfield) for bilateral (R>L) foot pain.  Got injection at [facility].  Dx hallux rigidus. Declined surgery, casted for orthotics, hasn't picked them up yet. He reports the pain is much better since the injection.  H/o bulging cervical disks, requiring nerve blocks in the past.  Last year was complaining of L shoulder/arm/hand pain with some recurrent numbness.  He saw Dr. Nelva Bush again last year, got another injection and is doing well.  He has some mild discomfort periodically.  Lactose intolerance:  He started having frequent diarrhea when having milkshakes every night with his kids, then noted problems with milk, ice cream, and if he  has a lot of cheese. Diarrhea issues are much better, no longer has ice cream, milk shakes or milk (will have loose still if he eats these).  Immunization History  Administered Date(s) Administered   Influenza,inj,Quad PF,6+ Mos 05/30/2014, 02/24/2018, 02/11/2019   PFIZER Comirnaty(Gray Top)Covid-19 Tri-Sucrose Vaccine 09/26/2020   PFIZER(Purple Top)SARS-COV-2 Vaccination 08/05/2019, 08/30/2019, 04/18/2020   Td 10/08/2000   Tdap 04/09/2012   Zoster Recombinat (Shingrix) 11/17/2019   Last colonoscopy: 11/16/20 with Dr. Henrene Hatfield; multiple polyps and two areas of potential cancer noted on exam. See above for path/details. Last PSA:  Lab Results  Component Value Date   PSA1 1.7 11/17/2019   PSA1 1.3 11/11/2018   PSA1 1.3 11/06/2017   PSA 1.62 05/30/2014   PSA 1.32 06/17/2011   Dentist: twice yearly Ophtho: went last year, has contacts Exercise: 5 days/week of weights/push-ups/sit-ups, plays with the dog. Golfs in the summer (walk the courses half the time) Edible marijuana a few times/week  Negative depression screen  Negative HIV/HepC screens via blood donation in the past.  PMH, PSH, SH reviewed  Outpatient Encounter Medications as of 11/22/2020  Medication Sig Note   ALPRAZolam (XANAX) 0.5 MG tablet Take 0.5-1 tablets (0.25-0.5 mg total) by mouth 3 (three)  times daily as needed for anxiety. 11/22/2020: Up to 1/day, occasionally needs 2   Ibuprofen-Acetaminophen (ADVIL DUAL ACTION) 125-250 MG TABS Take 2 tablets by mouth as needed. 11/22/2020: Uses advil or aleve if needed for pain (some in shoulders from golfing)   naproxen sodium (ALEVE) 220 MG tablet Take 440 mg by mouth 2 (two) times daily as needed. 11/22/2020: Uses advil or aleve if needed for pain (some in shoulders from golfing)   [EXPIRED] cyanocobalamin ((VITAMIN B-12)) injection 1,000 mcg     No facility-administered encounter medications on file as of 11/22/2020.   No Known Allergies  ROS:  The patient denies anorexia, fever, headaches, vision loss, decreased hearing, ear pain, hoarseness, chest pain, palpitations, dizziness, syncope, dyspnea on exertion, cough, swelling, nausea, vomiting, diarrhea, constipation, abdominal pain, melena, hematochezia, indigestion/heartburn, hematuria, incontinence, erectile dysfunction, nocturia, weakened urine stream, dysuria, genital lesions, tremor, suspicious skin lesions, depression, anxiety, abnormal bleeding/bruising, or enlarged lymph nodes. Denies any current allergy symptoms/congestion. Numbness in both feet, somewhat better, as is the pain, since he had the injection. Diarrhea from lactose-intolerance, controlled by diet.  No blood in the stool. Occ pain in neck/shoulders from golf.  No longer getting much pain related to the disks in his neck (resolved after injection).   PHYSICAL EXAM:  BP 128/84   Pulse (!) 55   Temp 97.6 F (36.4 C)   Ht 6' 1.25" (1.861 m)   Wt 185 lb 12.8 oz (84.3 kg)   BMI 24.35 kg/m   Wt Readings from Last 3 Encounters:  11/22/20 185 lb 12.8 oz (84.3 kg)  11/16/20 180 lb (81.6 kg)  11/02/20 180 lb (81.6 kg)   General Appearance:    Alert, cooperative, no distress, appears stated age. Pt declined to get changed into gown from exam today.  Head:     Normocephalic, without obvious abnormality, atraumatic   Eyes:      PERRL, conjunctiva/corneas clear, EOM's intact, fundi benign   Ears:     Normal TM's and external ear canals   Nose:    Not examined, wearing mask due to COVID-19 pandemic   Throat:    Not examined, wearing mask due to COVID-19 pandemic  Neck:    Supple, no lymphadenopathy;  thyroid:  no enlargement/ tenderness/nodules;  no carotid bruit or JVD.   Back:     Spine nontender, no curvature, ROM normal, no CVA tenderness   Lungs:      Clear to auscultation bilaterally without wheezes, rales or ronchi; respirations unlabored   Chest Wall:     No tenderness or deformity    Heart:     Regular rate and rhythm, S1 and S2 normal, no murmur, rub or gallop   Breast Exam:     No chest wall tenderness, masses or gynecomastia   Abdomen:      Soft, non-tender, nondistended, normoactive bowel sounds, no masses, no hepatosplenomegaly. Minimal diastasis recti, nontender. Umbilical hernia is not evident today  Genitalia:    Not performed, declined  Rectal:    Not performed, declined  Extremities:    No clubbing, cyanosis or edema   Pulses:    2+ and symmetric all extremities   Skin:    Skin color, texture, turgor normal, no rashes or lesions (exam limited, back examined).   Scabbed area and raised/pink/inflamed papule at L neck  Lymph nodes:    Cervical, supraclavicular, and axillary nodes normal   Neurologic:    Normal strength and gait; DTRs symmetric.             Psych:   Normal mood, affect, hygiene and grooming   ASSESSMENT/PLAN:  Annual physical exam - Plan: POCT URINALYSIS DIP (CLINITEK), PSA, VITAMIN D 25 Hydroxy (Vit-D Deficiency, Fractures), Vitamin B12, Lipid panel  Vitamin D deficiency - noncompliance with supplements, expect will be low.  Discussed need for longterm daily supplementation (MVI vs separate D3) - Plan: VITAMIN D 25 Hydroxy (Vit-D Deficiency, Fractures)  B12 deficiency - noncompliant with taking any supplements (never took oral supplements to see if effective). Give injection today,  then trial 1mg  oral daily; plan recheck 3-6 mo - Plan: Vitamin B12, cyanocobalamin ((VITAMIN B-12)) injection 1,000 mcg  Screening for prostate cancer  Pure hypercholesterolemia - higher last year. Reviewed low cholesterol diet in detail - Plan: Lipid panel  Need for shingles vaccine - Plan: Varicella-zoster vaccine IM (Shingrix)  Generalized anxiety disorder - Discussed potential for worsening moods related to new cancer diagnosis. Low threshold to restart lexapro. Cont xanax for now, not to use preventatively.  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  Ddx reviewed.  PSA, D, lipids, B12 here today.  Offered to draw labs as recommended by GI, but he didn't mind getting blood drawn twice--had to go there to pick up prep anyway.   Discussed PSA screening (risks/benefits). Recommended at least 30 minutes of aerobic activity at least 5 days/week, weight-bearing exercise at least 2x/week; proper sunscreen use reviewed; healthy diet and alcohol recommendations (less than or equal to 2 drinks/day) reviewed; regular seatbelt use; changing batteries in smoke detectors, use of carbon monoxide detectors. Immunization recommendations discussed--yearly flu shots recommended. 2nd dose of Shingrix was given today. Risks/SE reviewed.  Colon cancer screening recommendations reviewed--just had recent colonoscopy, diagnosed with cancer.  Planning staging.  Total visit time, face to face, was 70 mins (plus additional 15+ mins in chart review, documentation.)  Will need 3-6 mo f/u, depends on labs and what labs are being checked by other providers.  CPE 1 yr

## 2020-11-20 NOTE — Patient Instructions (Addendum)
  HEALTH MAINTENANCE RECOMMENDATIONS:  It is recommended that you get at least 30 minutes of aerobic exercise at least 5 days/week (for weight loss, you may need as much as 60-90 minutes). This can be any activity that gets your heart rate up. This can be divided in 10-15 minute intervals if needed, but try and build up your endurance at least once a week.  Weight bearing exercise is also recommended twice weekly.  Eat a healthy diet with lots of vegetables, fruits and fiber.  "Colorful" foods have a lot of vitamins (ie green vegetables, tomatoes, red peppers, etc).  Limit sweet tea, regular sodas and alcoholic beverages, all of which has a lot of calories and sugar.  Up to 2 alcoholic drinks daily may be beneficial for men (unless trying to lose weight, watch sugars).  Drink a lot of water.  Sunscreen of at least SPF 30 should be used on all sun-exposed parts of the skin when outside between the hours of 10 am and 4 pm (not just when at beach or pool, but even with exercise, golf, tennis, and yard work!)  Use a sunscreen that says "broad spectrum" so it covers both UVA and UVB rays, and make sure to reapply every 1-2 hours.  Remember to change the batteries in your smoke detectors when changing your clock times in the spring and fall.  Carbon monoxide detectors are recommended for your home.  Use your seat belt every time you are in a car, and please drive safely and not be distracted with cell phones and texting while driving.  We should restart the lexapro if your moods worsen (either depression or anxiety), if needing more than 1 xanax daily on a regular basis. Counseling is encouraged.  We will advise you of your results through [facility].   We gave you a B12 shot today.  You can start oral B12 1000 mcg (1 mg) once daily, as a trial to see if you absorb this and whether you can maintain levels with an oral vitamin rather than needing B12 shots.  Take Vitamin D3 1000 IU every day long-term (this  is smaller than a multivamin). If your levels are very low, we may give you another course of the prescription, but start the D3 upon completion of the prescription.  Schedule a routine appointment with the dermatologist (you need to have the area on the left neck rechecked.  Try and let it calm down and heal first (don't pick!).

## 2020-11-21 ENCOUNTER — Other Ambulatory Visit: Payer: Self-pay

## 2020-11-21 ENCOUNTER — Encounter: Payer: Self-pay | Admitting: Internal Medicine

## 2020-11-21 ENCOUNTER — Telehealth: Payer: Self-pay

## 2020-11-21 ENCOUNTER — Telehealth: Payer: Self-pay | Admitting: *Deleted

## 2020-11-21 DIAGNOSIS — Z1211 Encounter for screening for malignant neoplasm of colon: Secondary | ICD-10-CM

## 2020-11-21 DIAGNOSIS — K6289 Other specified diseases of anus and rectum: Secondary | ICD-10-CM

## 2020-11-21 NOTE — Telephone Encounter (Signed)
  Follow up Call-  Call back number 11/16/2020  Post procedure Call Back phone  # [phone]  Permission to leave phone message Yes  Some recent data might be hidden     Patient questions:  Do you have a fever, pain , or abdominal swelling? No. Pain Score  0 *  Have you tolerated food without any problems? Yes.    Have you been able to return to your normal activities? Yes.    Do you have any questions about your discharge instructions: Diet   No. Medications  No Follow up visit  No.  Do you have questions or concerns about your Care? Yes.  Patient has received pathology report via [facility] and is very concerned and would like to be contacted ASAP about findings and f/u care  Actions: * If pain score is 4 or above: No action needed, pain <4.  Have you developed a fever since your procedure? no  2.   Have you had an respiratory symptoms (SOB or cough) since your procedure? no  3.   Have you tested positive for COVID 19 since your procedure no  4.   Have you had any family members/close contacts diagnosed with the COVID 19 since your procedure?  no   If yes to any of these questions please route to Joylene John, RN and Joella Prince, RN

## 2020-11-21 NOTE — Telephone Encounter (Signed)
Message left

## 2020-11-22 ENCOUNTER — Other Ambulatory Visit: Payer: Self-pay

## 2020-11-22 ENCOUNTER — Other Ambulatory Visit (INDEPENDENT_AMBULATORY_CARE_PROVIDER_SITE_OTHER): Payer: BC Managed Care – PPO

## 2020-11-22 ENCOUNTER — Encounter: Payer: Self-pay | Admitting: Family Medicine

## 2020-11-22 ENCOUNTER — Ambulatory Visit (INDEPENDENT_AMBULATORY_CARE_PROVIDER_SITE_OTHER): Payer: BC Managed Care – PPO | Admitting: Family Medicine

## 2020-11-22 VITALS — BP 128/84 | HR 55 | Temp 97.6°F | Ht 73.25 in | Wt 185.8 lb

## 2020-11-22 DIAGNOSIS — Z23 Encounter for immunization: Secondary | ICD-10-CM | POA: Diagnosis not present

## 2020-11-22 DIAGNOSIS — K6289 Other specified diseases of anus and rectum: Secondary | ICD-10-CM | POA: Diagnosis not present

## 2020-11-22 DIAGNOSIS — F411 Generalized anxiety disorder: Secondary | ICD-10-CM | POA: Diagnosis not present

## 2020-11-22 DIAGNOSIS — Z125 Encounter for screening for malignant neoplasm of prostate: Secondary | ICD-10-CM | POA: Diagnosis not present

## 2020-11-22 DIAGNOSIS — Z1211 Encounter for screening for malignant neoplasm of colon: Secondary | ICD-10-CM | POA: Diagnosis not present

## 2020-11-22 DIAGNOSIS — E538 Deficiency of other specified B group vitamins: Secondary | ICD-10-CM | POA: Diagnosis not present

## 2020-11-22 DIAGNOSIS — E7800 Pure hypercholesterolemia, unspecified: Secondary | ICD-10-CM | POA: Diagnosis not present

## 2020-11-22 DIAGNOSIS — C189 Malignant neoplasm of colon, unspecified: Secondary | ICD-10-CM

## 2020-11-22 DIAGNOSIS — Z0000 Encounter for general adult medical examination without abnormal findings: Secondary | ICD-10-CM

## 2020-11-22 DIAGNOSIS — E559 Vitamin D deficiency, unspecified: Secondary | ICD-10-CM

## 2020-11-22 LAB — CBC WITH DIFFERENTIAL/PLATELET
Basophils Absolute: 0 10*3/uL (ref 0.0–0.1)
Basophils Relative: 0.7 % (ref 0.0–3.0)
Eosinophils Absolute: 0.1 10*3/uL (ref 0.0–0.7)
Eosinophils Relative: 1.8 % (ref 0.0–5.0)
HCT: 43.1 % (ref 39.0–52.0)
Hemoglobin: 14.7 g/dL (ref 13.0–17.0)
Lymphocytes Relative: 44.2 % (ref 12.0–46.0)
Lymphs Abs: 2.5 10*3/uL (ref 0.7–4.0)
MCHC: 34.1 g/dL (ref 30.0–36.0)
MCV: 89.3 fl (ref 78.0–100.0)
Monocytes Absolute: 0.5 10*3/uL (ref 0.1–1.0)
Monocytes Relative: 8.3 % (ref 3.0–12.0)
Neutro Abs: 2.5 10*3/uL (ref 1.4–7.7)
Neutrophils Relative %: 45 % (ref 43.0–77.0)
Platelets: 314 10*3/uL (ref 150.0–400.0)
RBC: 4.83 Mil/uL (ref 4.22–5.81)
RDW: 14.6 % (ref 11.5–15.5)
WBC: 5.6 10*3/uL (ref 4.0–10.5)

## 2020-11-22 LAB — POCT URINALYSIS DIP (CLINITEK)
Bilirubin, UA: NEGATIVE
Blood, UA: NEGATIVE
Glucose, UA: NEGATIVE mg/dL
Ketones, POC UA: NEGATIVE mg/dL
Leukocytes, UA: NEGATIVE
Nitrite, UA: NEGATIVE
POC PROTEIN,UA: NEGATIVE
Spec Grav, UA: 1.025 (ref 1.010–1.025)
Urobilinogen, UA: 0.2 E.U./dL
pH, UA: 6 (ref 5.0–8.0)

## 2020-11-22 LAB — COMPREHENSIVE METABOLIC PANEL
ALT: 17 U/L (ref 0–53)
AST: 17 U/L (ref 0–37)
Albumin: 4.4 g/dL (ref 3.5–5.2)
Alkaline Phosphatase: 57 U/L (ref 39–117)
BUN: 13 mg/dL (ref 6–23)
CO2: 29 mEq/L (ref 19–32)
Calcium: 9.2 mg/dL (ref 8.4–10.5)
Chloride: 103 mEq/L (ref 96–112)
Creatinine, Ser: 0.76 mg/dL (ref 0.40–1.50)
GFR: 104.06 mL/min (ref >60.00)
Glucose, Bld: 97 mg/dL (ref 70–99)
Potassium: 3.9 mEq/L (ref 3.5–5.1)
Sodium: 139 mEq/L (ref 135–145)
Total Bilirubin: 0.5 mg/dL (ref 0.2–1.2)
Total Protein: 7.2 g/dL (ref 6.0–8.3)

## 2020-11-22 LAB — PROTIME-INR
INR: 1 ratio (ref 0.8–1.0)
Prothrombin Time: 11.1 s (ref 9.6–13.1)

## 2020-11-22 LAB — APTT: aPTT: 34.2 s — ABNORMAL HIGH (ref 23.4–32.7)

## 2020-11-22 MED ORDER — CYANOCOBALAMIN 1000 MCG/ML IJ SOLN
1000.0000 ug | Freq: Once | INTRAMUSCULAR | Status: AC
  Administered 2020-11-22: 1000 ug via INTRAMUSCULAR

## 2020-11-23 LAB — LIPID PANEL
Chol/HDL Ratio: 3.7 ratio (ref 0.0–5.0)
Cholesterol, Total: 205 mg/dL — ABNORMAL HIGH (ref 100–199)
HDL: 55 mg/dL (ref >39)
LDL Chol Calc (NIH): 131 mg/dL — ABNORMAL HIGH (ref 0–99)
Triglycerides: 105 mg/dL (ref 0–149)
VLDL Cholesterol Cal: 19 mg/dL (ref 5–40)

## 2020-11-23 LAB — VITAMIN D 25 HYDROXY (VIT D DEFICIENCY, FRACTURES): Vit D, 25-Hydroxy: 24.8 ng/mL — ABNORMAL LOW (ref 30.0–100.0)

## 2020-11-23 LAB — VITAMIN B12: Vitamin B-12: 214 pg/mL — ABNORMAL LOW (ref 232–1245)

## 2020-11-23 LAB — CEA: CEA: 1.9 ng/mL

## 2020-11-23 LAB — PSA: Prostate Specific Ag, Serum: 1.9 ng/mL (ref 0.0–4.0)

## 2020-11-23 MED ORDER — VITAMIN D (ERGOCALCIFEROL) 1.25 MG (50000 UNIT) PO CAPS: 50000.0000 [IU] | ORAL_CAPSULE | ORAL | 0 refills | Status: DC

## 2020-11-23 NOTE — Addendum Note (Signed)
Addended by: Rita Ohara on: 11/23/2020 07:13 AM   Modules accepted: Orders

## 2020-11-27 ENCOUNTER — Other Ambulatory Visit: Payer: Self-pay

## 2020-11-27 ENCOUNTER — Ambulatory Visit (HOSPITAL_COMMUNITY)
Admission: RE | Admit: 2020-11-27 | Discharge: 2020-11-27 | Disposition: A | Discharge status: Home / Self Care | Payer: BC Managed Care – PPO | Source: Ambulatory Visit | Attending: Internal Medicine | Admitting: Internal Medicine

## 2020-11-27 DIAGNOSIS — K7689 Other specified diseases of liver: Secondary | ICD-10-CM | POA: Diagnosis not present

## 2020-11-27 DIAGNOSIS — J439 Emphysema, unspecified: Secondary | ICD-10-CM | POA: Diagnosis not present

## 2020-11-27 DIAGNOSIS — Z1211 Encounter for screening for malignant neoplasm of colon: Secondary | ICD-10-CM | POA: Insufficient documentation

## 2020-11-27 DIAGNOSIS — I700 Atherosclerosis of aorta: Secondary | ICD-10-CM | POA: Diagnosis not present

## 2020-11-27 DIAGNOSIS — K6289 Other specified diseases of anus and rectum: Secondary | ICD-10-CM | POA: Diagnosis not present

## 2020-11-27 DIAGNOSIS — C189 Malignant neoplasm of colon, unspecified: Secondary | ICD-10-CM | POA: Diagnosis not present

## 2020-11-27 DIAGNOSIS — J9811 Atelectasis: Secondary | ICD-10-CM | POA: Diagnosis not present

## 2020-11-27 MED ORDER — SODIUM CHLORIDE (PF) 0.9 % IJ SOLN
INTRAMUSCULAR | Status: AC
  Filled 2020-11-27: qty 50

## 2020-11-27 MED ORDER — IOHEXOL 350 MG/ML SOLN
80.0000 mL | Freq: Once | INTRAVENOUS | Status: AC | PRN
  Administered 2020-11-27: 80 mL via INTRAVENOUS

## 2020-11-29 ENCOUNTER — Other Ambulatory Visit: Payer: Self-pay | Admitting: *Deleted

## 2020-11-29 ENCOUNTER — Other Ambulatory Visit: Payer: Self-pay

## 2020-11-29 DIAGNOSIS — C20 Malignant neoplasm of rectum: Secondary | ICD-10-CM

## 2020-11-29 DIAGNOSIS — E538 Deficiency of other specified B group vitamins: Secondary | ICD-10-CM

## 2020-12-01 ENCOUNTER — Telehealth: Payer: Self-pay | Admitting: Oncology

## 2020-12-01 NOTE — Telephone Encounter (Signed)
Received a new pt referral from Dr. Henrene Pastor for rectal cancer. [name] has been cld and scheduled to see Dr. Benay Spice on 7/20 at 8:45am. Pt aware to arrive 15 minutes early.

## 2020-12-05 ENCOUNTER — Telehealth: Payer: Self-pay | Admitting: *Deleted

## 2020-12-05 NOTE — Telephone Encounter (Signed)
Called patient to confirm new patient appointment tomorrow. Provided information on office location, parking and entrance to building. Arrive at 0830 for 0845 visit and allowed one visitor w/him. Masks are still required. He confirms he will be here w/his wife.

## 2020-12-06 ENCOUNTER — Other Ambulatory Visit: Payer: Self-pay

## 2020-12-06 ENCOUNTER — Inpatient Hospital Stay: Payer: BC Managed Care – PPO | Attending: Oncology | Admitting: Oncology

## 2020-12-06 ENCOUNTER — Telehealth: Payer: Self-pay | Admitting: *Deleted

## 2020-12-06 ENCOUNTER — Other Ambulatory Visit: Payer: Self-pay | Admitting: Nurse Practitioner

## 2020-12-06 VITALS — BP 116/71 | HR 68 | Temp 97.8°F | Resp 16 | Wt 186.2 lb

## 2020-12-06 DIAGNOSIS — Z79899 Other long term (current) drug therapy: Secondary | ICD-10-CM | POA: Insufficient documentation

## 2020-12-06 DIAGNOSIS — Z8371 Family history of colonic polyps: Secondary | ICD-10-CM | POA: Insufficient documentation

## 2020-12-06 DIAGNOSIS — C189 Malignant neoplasm of colon, unspecified: Secondary | ICD-10-CM

## 2020-12-06 DIAGNOSIS — J449 Chronic obstructive pulmonary disease, unspecified: Secondary | ICD-10-CM | POA: Insufficient documentation

## 2020-12-06 DIAGNOSIS — C20 Malignant neoplasm of rectum: Secondary | ICD-10-CM | POA: Insufficient documentation

## 2020-12-06 DIAGNOSIS — Z87891 Personal history of nicotine dependence: Secondary | ICD-10-CM | POA: Insufficient documentation

## 2020-12-06 DIAGNOSIS — E538 Deficiency of other specified B group vitamins: Secondary | ICD-10-CM | POA: Insufficient documentation

## 2020-12-06 NOTE — Progress Notes (Signed)
[facility] New Patient Consult   Requesting MD: Rita Ohara, Md [address] [place],  [place] [address]   Robert Hatfield 50 y.o.  1970-10-22    Reason for Consult: Colon cancer, rectal cancer, multiple polyps   HPI: Mr. Rollyson had his first screening colonoscopy by Dr. Henrene Pastor on 11/16/2020.  Polyps were removed from the ascending, transverse, and sigmoid colon.  A nonobstructing mass was found in the descending colon at 40 cm.  The mass was biopsied and the area was tattooed.  A large lateral spreading mass was found in the rectum beginning at 4 cm from the anal verge.  The lesion was circumferential, soft, and bulky.  The lesion extended proximal to 15 cm from anal verge.  The mass was biopsied and a tattoo was placed above the proximal margin.  The pathology revealed multiple tubular adenomas.  The descending colon mass returned as adenocarcinoma.  Biopsy of the rectal mass revealed a tubulovillous adenoma with focal high-grade dysplasia.  No invasive carcinoma was identified.  He was referred for CTs of the chest, abdomen, and pelvis on 11/27/2020.  A 3 mm subpleural left lower lobe nodule is felt to likely be a benign subpleural lymph node.  Mild paraseptal emphysema.  A 7 mm low-attenuation lesion in segment 4 of the liver is too small to characterize.  The liver is slightly steatotic and otherwise unremarkable.  No pathologic enlarged lymph nodes.  No free fluid.  Fluid" stool "are seen in the rectosigmoid colon.  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 Nelva Bush)   Depression 2000-2002, 2015   treated with Zoloft and Klonopin x 1-2 yrs; recurrent in 2015   Pure hyperglyceridemia     .  Vitamin B12 deficiency   .  Vitamin D deficiency  Past Surgical History:  Procedure Laterality Date   DEBRIDEMENT TENNIS ELBOW  Right 2000   HUMERUS FRACTURE SURGERY Right    age 69   KNEE ARTHROSCOPY Left    age [age]   KNEE ARTHROSCOPY Right    age [age]   TONSILLECTOMY  child    Medications: Reviewed  Allergies: No Known Allergies  Family history: His father had colon polyps and prostate cancer.  His paternal grandmother had breast cancer.  His mother had lung cancer and was a smoker.  His sister has colon polyps  Social History:   He lives with his wife and 2 sons in [place].  He works as a Chief Strategy Officer.  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  He quit smoking cigarettes 16 years ago.  No transfusion history.  No risk factor for HIV or hepatitis.  ROS:   Positives include: Multiple loose stools per day for the past year, intermittent pain in the neck/left arm/shoulder-improved following injection therapy, chronic cystic lesions in the right axilla and right groin  A complete ROS was otherwise negative.  Physical Exam:  Blood pressure 116/71, pulse 68, temperature 97.8 F (36.6 C), temperature source Temporal, resp. rate 16, weight 186 lb 4 oz (84.5 kg), SpO2 100 %.  Lungs: Clear bilaterally, no respiratory distress Cardiac: Regular rate and rhythm Abdomen: No hepatosplenomegaly, no mass GU: Testes without mass Vascular: No leg edema Lymph nodes: No cervical, supraclavicular, axillary, or inguinal nodes Neurologic:  The motor exam appears intact in the upper and lower extremities bilaterally Skin: No rash, cutaneous 2 cm cystic lesion in the right axilla, similar 2-3 cm cystic lesion in the medial right groin  Musculoskeletal: No spine tenderness   LAB:  CBC  Lab Results  Component Value Date   WBC 5.6 11/22/2020   HGB 14.7 11/22/2020   HCT 43.1 11/22/2020   MCV 89.3 11/22/2020   PLT 314.0 11/22/2020   NEUTROABS 2.5 11/22/2020        CMP  Lab Results  Component Value Date   NA 139 11/22/2020   K 3.9 11/22/2020   CL 103 11/22/2020   CO2 29 11/22/2020    GLUCOSE 97 11/22/2020   BUN 13 11/22/2020   CREATININE 0.76 11/22/2020   CALCIUM 9.2 11/22/2020   PROT 7.2 11/22/2020   ALBUMIN 4.4 11/22/2020   AST 17 11/22/2020   ALT 17 11/22/2020   ALKPHOS 57 11/22/2020   BILITOT 0.5 11/22/2020   GFRNONAA 103 11/17/2019   GFRAA 119 11/17/2019      Imaging:  CT images from 11/27/2020-reviewed    Assessment/Plan:   Colon cancer Biopsy of a descending colon mass at 40 cm revealed adenocarcinoma Rectal mass Circumferential mass beginning at 4 cm from the anal verge-biopsy tubulovillous adenoma with high-grade dysplasia CT 11/27/2020-indeterminate 7 mm segment 4 liver lesion, no other evidence of metastatic disease  3.  COPD 4.  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  Family history of colon polyps 6.  Multiple colon polyps on the colonoscopy 11/16/2020-tubular adenomas 7.  Remote history of tobacco use 8.  Remote history of heavy alcohol use 9.  Vitamin B12 deficiency   Disposition:   Mr. Fellner underwent a screening colonoscopy on 11/16/2020.  He was found to have multiple colon polyps in addition to masses in the descending colon and rectum.  Biopsy of the colon mass revealed adenocarcinoma.  Biopsy of the rectal mass revealed a tubulovillous adenoma with high-grade dysplasia.  His case was presented at the GI tumor conference earlier today.  Review of CT images is consistent with a large rectal mass felt to most likely represent a rectal cancer.  I discussed the colonoscopy, CT, and pathology findings with Mr. Renstrom.  He understands he most likely has synchronous colon and rectal tumors.  We will most likely recommend a course of neoadjuvant therapy pending additional staging evaluation with a pelvic MRI.  He may have hereditary 9 polyposis colon cancer syndrome.  We requested immunohistochemical stains for mismatch repair proteins on the descending colon biopsy.  I made a referral to the genetics  counselor.  Mr. Stammer is scheduled to see Dr. Dema Severin on 12/15/2020.  I will see him the same day.  We made a referral to Dr. Lisbeth Renshaw.    Betsy Coder, MD  12/06/2020, 10:01 AM

## 2020-12-06 NOTE — Telephone Encounter (Signed)
Called [facility] to schedule pelvis MR. They prefer to call patient themselves to schedule and will do so now. 1st available for MRI on a male is 12/16/20 at 5 pm. Notified Baxter Flattery in managed care to work on [facility].

## 2020-12-06 NOTE — Progress Notes (Signed)
The proposed treatment discussed in conference is for discussion purpose only and is not a binding recommendation.  The patients have not been physically examined, or presented with their treatment options.  Therefore, final treatment plans cannot be decided.  

## 2020-12-10 ENCOUNTER — Ambulatory Visit
Admission: RE | Admit: 2020-12-10 | Discharge: 2020-12-10 | Disposition: A | Discharge status: Home / Self Care | Payer: BC Managed Care – PPO | Source: Ambulatory Visit | Attending: Oncology | Admitting: Oncology

## 2020-12-10 ENCOUNTER — Other Ambulatory Visit: Payer: Self-pay

## 2020-12-10 ENCOUNTER — Other Ambulatory Visit: Payer: Self-pay | Admitting: Oncology

## 2020-12-10 DIAGNOSIS — C189 Malignant neoplasm of colon, unspecified: Secondary | ICD-10-CM | POA: Diagnosis not present

## 2020-12-10 DIAGNOSIS — K6289 Other specified diseases of anus and rectum: Secondary | ICD-10-CM | POA: Diagnosis not present

## 2020-12-10 DIAGNOSIS — D492 Neoplasm of unspecified behavior of bone, soft tissue, and skin: Secondary | ICD-10-CM | POA: Diagnosis not present

## 2020-12-11 ENCOUNTER — Telehealth: Payer: Self-pay

## 2020-12-11 NOTE — Telephone Encounter (Signed)
Left message for pt to call back regarding scheduling flex-sig on 12/22/20 at 3pm, spot on hold.

## 2020-12-11 NOTE — Telephone Encounter (Signed)
-----   Message from Irene Shipper, MD sent at 12/11/2020 10:52 AM EDT ----- Regarding: RE: Needs flexible sigmoidoscopy That would be great.  Thanks ----- Message ----- From: Algernon Huxley, RN Sent: 12/11/2020   9:56 AM EDT To: Irene Shipper, MD Subject: RE: Needs flexible sigmoidoscopy               I just looked at the schedule and you have an open slot on Friday 8/5 at 3pm, would that be ok? That way you are not double booked. Just let me know what you prefer. ----- Message ----- From: Irene Shipper, MD Sent: 12/09/2020   3:13 PM EDT To: Algernon Huxley, RN Subject: RE: Needs flexible sigmoidoscopy               See if 3 pm Thursday August 4th works better. Thanks.  ----- Message ----- From: Algernon Huxley, RN Sent: 12/08/2020   4:55 PM EDT To: Irene Shipper, MD Subject: RE: Needs flexible sigmoidoscopy               Was trying to schedule pt and he is scheduled for his consult with Rad/Onc at the cancer center that day at 10am with Dr. Lisbeth Renshaw and nurse eval before that appt. Please advise. ----- Message ----- From: Irene Shipper, MD Sent: 12/08/2020   4:04 PM EDT To: Irene Shipper, MD, Algernon Huxley, RN Subject: Needs flexible sigmoidoscopy                   Vaughan Basta, I spoke with the colorectal surgeon Dr. Dema Severin.  He wants me to perform flexible sigmoidoscopy with additional biopsies of the rectal mass.  I know that my schedule is full, however please add Mr. Suba on for flexible sigmoidoscopy Tuesday, August 2 at 9:30 AM.  Please reach out to him regarding standard flexible sigmoidoscopy prep and other relevant instructions.  I did call him a few minutes ago, and left a message on his voicemail regarding this issue.  Thanks [employer]. Dr. Henrene Pastor

## 2020-12-11 NOTE — Telephone Encounter (Signed)
Pt scheduled for telephone previsit 12/15/20 at 1pm, Propofol flex-sig scheduled in the [facility] 12/22/20 at 3pm. Pt aware of appts.

## 2020-12-12 ENCOUNTER — Telehealth: Payer: Self-pay

## 2020-12-12 NOTE — Telephone Encounter (Signed)
TC to Pathology to request IHC for mismatch repair proteins for invasive colon tumor spoke with Casimer Bilis at Pathology who stated IHC will not be back until tomorrow.

## 2020-12-12 NOTE — Telephone Encounter (Signed)
-----  Message from Ladell Pier, MD sent at 12/11/2020  8:33 PM EDT ----- Please be sure IHC for mismatch repair proteins is done on the invasive colon tumor, request this be done ASAP, need by Friday appt.

## 2020-12-14 ENCOUNTER — Inpatient Hospital Stay: Payer: BC Managed Care – PPO

## 2020-12-14 ENCOUNTER — Other Ambulatory Visit: Payer: Self-pay | Admitting: Genetic Counselor

## 2020-12-14 ENCOUNTER — Other Ambulatory Visit: Payer: Self-pay | Admitting: Family Medicine

## 2020-12-14 ENCOUNTER — Inpatient Hospital Stay (HOSPITAL_BASED_OUTPATIENT_CLINIC_OR_DEPARTMENT_OTHER): Payer: BC Managed Care – PPO | Admitting: Genetic Counselor

## 2020-12-14 ENCOUNTER — Other Ambulatory Visit: Payer: Self-pay

## 2020-12-14 DIAGNOSIS — Z801 Family history of malignant neoplasm of trachea, bronchus and lung: Secondary | ICD-10-CM

## 2020-12-14 DIAGNOSIS — C189 Malignant neoplasm of colon, unspecified: Secondary | ICD-10-CM

## 2020-12-14 DIAGNOSIS — Z86010 Personal history of colonic polyps: Secondary | ICD-10-CM | POA: Diagnosis not present

## 2020-12-14 DIAGNOSIS — Z8042 Family history of malignant neoplasm of prostate: Secondary | ICD-10-CM | POA: Diagnosis not present

## 2020-12-14 DIAGNOSIS — Z803 Family history of malignant neoplasm of breast: Secondary | ICD-10-CM

## 2020-12-14 DIAGNOSIS — F411 Generalized anxiety disorder: Secondary | ICD-10-CM

## 2020-12-14 DIAGNOSIS — Z800 Family history of malignant neoplasm of digestive organs: Secondary | ICD-10-CM | POA: Diagnosis not present

## 2020-12-14 DIAGNOSIS — Z8371 Family history of colonic polyps: Secondary | ICD-10-CM

## 2020-12-14 LAB — GENETIC SCREENING ORDER

## 2020-12-14 NOTE — Telephone Encounter (Signed)
Is this okay to refill? 

## 2020-12-15 ENCOUNTER — Inpatient Hospital Stay (HOSPITAL_BASED_OUTPATIENT_CLINIC_OR_DEPARTMENT_OTHER): Payer: BC Managed Care – PPO | Admitting: Oncology

## 2020-12-15 ENCOUNTER — Other Ambulatory Visit: Payer: Self-pay

## 2020-12-15 ENCOUNTER — Ambulatory Visit (AMBULATORY_SURGERY_CENTER): Payer: BC Managed Care – PPO | Admitting: *Deleted

## 2020-12-15 VITALS — Ht 73.25 in | Wt 183.0 lb

## 2020-12-15 VITALS — BP 115/74 | HR 62 | Temp 97.8°F | Resp 18 | Ht 73.0 in | Wt 186.2 lb

## 2020-12-15 DIAGNOSIS — E538 Deficiency of other specified B group vitamins: Secondary | ICD-10-CM | POA: Diagnosis not present

## 2020-12-15 DIAGNOSIS — C20 Malignant neoplasm of rectum: Secondary | ICD-10-CM | POA: Diagnosis not present

## 2020-12-15 DIAGNOSIS — C189 Malignant neoplasm of colon, unspecified: Secondary | ICD-10-CM

## 2020-12-15 DIAGNOSIS — K6289 Other specified diseases of anus and rectum: Secondary | ICD-10-CM | POA: Diagnosis not present

## 2020-12-15 DIAGNOSIS — J449 Chronic obstructive pulmonary disease, unspecified: Secondary | ICD-10-CM | POA: Diagnosis not present

## 2020-12-15 DIAGNOSIS — Z87891 Personal history of nicotine dependence: Secondary | ICD-10-CM | POA: Diagnosis not present

## 2020-12-15 DIAGNOSIS — Z79899 Other long term (current) drug therapy: Secondary | ICD-10-CM | POA: Diagnosis not present

## 2020-12-15 DIAGNOSIS — C186 Malignant neoplasm of descending colon: Secondary | ICD-10-CM | POA: Diagnosis not present

## 2020-12-15 DIAGNOSIS — Z8371 Family history of colonic polyps: Secondary | ICD-10-CM | POA: Diagnosis not present

## 2020-12-15 NOTE — Progress Notes (Signed)
  No trouble with anesthesia, denies being told they were difficult to intubate, or hx/fam hx of malignant hyperthermia per pt   Spoke with Dr. Henrene Pastor- since Mag Citrate isn't available, please use 1/2 Miralax prep with enema 1 hour prior to arrival   No egg or soy allergy  No home oxygen use   No medications for weight loss taken  Instructions sent via MyChart

## 2020-12-15 NOTE — Progress Notes (Signed)
  [facility] OFFICE PROGRESS NOTE   Diagnosis: Colon cancer, rectal mass  INTERVAL HISTORY:   Robert Hatfield returns as scheduled.  He saw Dr. Dema Severin this morning.  He is scheduled for a repeat biopsy of the rectal mass on 12/22/2020.  He continues to have 2 loose stools per day.  No bleeding.  No new complaint.  Objective:  Vital signs in last 24 hours:  Blood pressure 115/74, pulse 62, temperature 97.8 F (36.6 C), temperature source Oral, resp. rate 18, height _0  (1.854 m), weight 186 lb 3.2 oz (84.5 kg), SpO2 100 %.    Physical examination-not performed today  Lab Results:  Lab Results  Component Value Date   WBC 5.6 11/22/2020   HGB 14.7 11/22/2020   HCT 43.1 11/22/2020   MCV 89.3 11/22/2020   PLT 314.0 11/22/2020   NEUTROABS 2.5 11/22/2020    CMP  Lab Results  Component Value Date   NA 139 11/22/2020   K 3.9 11/22/2020   CL 103 11/22/2020   CO2 29 11/22/2020   GLUCOSE 97 11/22/2020   BUN 13 11/22/2020   CREATININE 0.76 11/22/2020   CALCIUM 9.2 11/22/2020   PROT 7.2 11/22/2020   ALBUMIN 4.4 11/22/2020   AST 17 11/22/2020   ALT 17 11/22/2020   ALKPHOS 57 11/22/2020   BILITOT 0.5 11/22/2020   GFRNONAA 103 11/17/2019   GFRAA 119 11/17/2019    Lab Results  Component Value Date   CEA 1.9 11/22/2020     Medications: I have reviewed the patient's current medications.   Assessment/Plan: Colon cancer Biopsy of a descending colon mass at 40 cm revealed adenocarcinoma, normal mismatch repair protein expression Rectal mass Circumferential mass beginning at 4 cm from the anal verge-biopsy tubulovillous adenoma with high-grade dysplasia CT 11/27/2020-indeterminate 7 mm segment 4 liver lesion, no other evidence of metastatic disease MRI pelvis 12/10/2020-tumor at 12.1 cm from the anal verge, 6.4 cm from the internal anal sphincter, T3b versus T3c, N0   3.  COPD 4.  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  Family  history of colon polyps 6.  Multiple colon polyps on the colonoscopy 11/16/2020-tubular adenomas 7.  Remote history of tobacco use 8.  Remote history of heavy alcohol use 9.  Vitamin B12 deficiency       Disposition: [name] appears stable.  I reviewed the pelvic MRI findings with him.  He appears to have a T3N0 rectal tumor, though invasive disease has not been confirmed on a biopsy.  We discussed treatment options.  He may be a candidate for upfront surgery if the repeat rectal biopsy does not reveal invasive cancer.  If invasive cancer is confirmed we will most likely recommend neoadjuvant capecitabine and radiation.   [name] saw the genetics counselor.  Germline genetic testing is pending.  The descending colon mass had no loss of mismatch repair protein expression suggesting he does not have hereditary nonpolyposis colon cancer syndrome.   His case will be presented at the GI tumor conference on 12/20/2020.  He is scheduled to see Dr. Lisbeth Renshaw on 12/19/2020.  [name] is scheduled to see Dr. Dema Severin on 12/26/2020.  He will return for an office visit here on 12/27/2020.    Betsy Coder, MD  12/15/2020  1:57 PM

## 2020-12-18 ENCOUNTER — Encounter: Payer: Self-pay | Admitting: Genetic Counselor

## 2020-12-18 DIAGNOSIS — Z860100 Personal history of colon polyps, unspecified: Secondary | ICD-10-CM

## 2020-12-18 DIAGNOSIS — Z800 Family history of malignant neoplasm of digestive organs: Secondary | ICD-10-CM

## 2020-12-18 DIAGNOSIS — Z803 Family history of malignant neoplasm of breast: Secondary | ICD-10-CM

## 2020-12-18 DIAGNOSIS — Z8042 Family history of malignant neoplasm of prostate: Secondary | ICD-10-CM

## 2020-12-18 DIAGNOSIS — Z83719 Family history of colon polyps, unspecified: Secondary | ICD-10-CM | POA: Insufficient documentation

## 2020-12-18 DIAGNOSIS — Z8371 Family history of colonic polyps: Secondary | ICD-10-CM

## 2020-12-18 DIAGNOSIS — Z801 Family history of malignant neoplasm of trachea, bronchus and lung: Secondary | ICD-10-CM | POA: Insufficient documentation

## 2020-12-18 HISTORY — DX: Personal history of colonic polyps: Z86.010

## 2020-12-18 HISTORY — DX: Family history of colonic polyps: Z83.71

## 2020-12-18 HISTORY — DX: Personal history of colon polyps, unspecified: Z86.0100

## 2020-12-18 HISTORY — DX: Family history of colon polyps, unspecified: Z83.719

## 2020-12-18 HISTORY — DX: Family history of malignant neoplasm of breast: Z80.3

## 2020-12-18 HISTORY — DX: Family history of malignant neoplasm of prostate: Z80.42

## 2020-12-18 HISTORY — DX: Family history of malignant neoplasm of trachea, bronchus and lung: Z80.1

## 2020-12-18 HISTORY — DX: Family history of malignant neoplasm of digestive organs: Z80.0

## 2020-12-18 NOTE — Progress Notes (Signed)
GI Location of Tumor / Histology: Rectal Cancer/ Descending Colon Cancer   MRI Pelvis 12/10/2020: Rectal adenocarcinoma T stage: At least T3 B tumor, potentially T3 C as described. Frank extension beyond muscularis noticed on the LEFT with suspected extension on the RIGHT, potentially up to 6 mm. Tumor begins in the mid rectum and extends nearly 10 cm cephalad.   Rectal adenocarcinoma N stage:  N0   Distance from tumor to the internal anal sphincter is 6.4 cm.  Repeat Rectal Mass Biopsy 12/22/2020  CT CAP 11/27/2020: No definitive evidence of metastatic disease.  7 mm low-attenuation lesion in segment 4 of the liver, too small to characterize.  Liver appears slightly steatotic.   Biopsies of Rectal Mass     Past/Anticipated interventions by surgeon, if any:  Dr. Dema Severin 12/26/2020   Past/Anticipated interventions by medical oncology, if any:  Dr. Benay Spice 12/15/2020 - I reviewed the pelvic MRI findings with him.  He appears to have a T3N0 rectal tumor, though invasive disease has not been confirmed on a biopsy.   -He may be a candidate for upfront surgery if the repeat rectal biopsy does not reveal invasive cancer.  If invasive cancer is confirmed we will most likely recommend neoadjuvant capecitabine and radiation. -Follow-up 12/27/2020   Weight changes, if any: Stable  Bowel/Bladder complaints, if any: Diarrhea, couple times per day. No urinary changes.  Nausea / Vomiting, if any: No  Pain issues, if any: None   Any blood per rectum: none  SAFETY ISSUES: Prior radiation? No Pacemaker/ICD? No Possible current pregnancy? N/a Is the patient on methotrexate? No  Current Complaints/Details: Genetics:  -Germline genetic test is pending. -The descending colon mass had no loss of mismatch repair protein expression suggesting he does not have hereditary nonpolyposis colon cancer syndrome.

## 2020-12-18 NOTE — Progress Notes (Signed)
REFERRING PROVIDER: Ladell Pier, [address] [place],  [place] [address]  PRIMARY PROVIDER:  Rita Ohara, MD  PRIMARY REASON FOR VISIT:  1. Adenocarcinoma of colon ([place])   2. Family history of colon cancer   3. Personal history of colonic polyps   4. Family history of colonic polyps   5. Family history of breast cancer   6. Family history of prostate cancer   7. Family history of lung cancer      HISTORY OF PRESENT ILLNESS:   Robert Hatfield, a 50 y.o. male, was seen for a [facility] cancer genetics consultation at the request of Dr. Benay Spice due to a personal and family history of cancer.  Robert Hatfield presents to clinic today to discuss the possibility of a hereditary predisposition to cancer, to discuss genetic testing, and to further clarify his future cancer risks, as well as potential cancer risks for family members.   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 The treatment plan is pending.   CANCER HISTORY:  Oncology History  Adenocarcinoma of colon ([place])  11/22/2020 Initial Diagnosis   Adenocarcinoma of colon ([place])    12/06/2020 Cancer Staging   Staging form: Colon and Rectum, AJCC 8th Edition - Clinical: Stage Unknown (cTX, cNX, cM0) - Signed by Ladell Pier, MD on 12/06/2020  Stage prefix: Initial diagnosis       RISK FACTORS:  Colonoscopy: yes;  see history noted above . 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 Nelva Bush)   Cancer [facility])    Depression 2000-2002, 2015   treated with Zoloft  and Klonopin x 1-2 yrs; recurrent in 2015   Family history of breast cancer 12/18/2020   Family history of colon cancer 12/18/2020   Family history of colonic polyps 12/18/2020   Family history of lung cancer 12/18/2020   Family history of prostate cancer 12/18/2020   Personal history of colonic polyps 12/18/2020   Pure hyperglyceridemia     Past Surgical History:  Procedure Laterality Date   COLONOSCOPY     DEBRIDEMENT TENNIS ELBOW Right 2000   HUMERUS FRACTURE SURGERY Right    age 89   KNEE ARTHROSCOPY Left    age [age]   KNEE ARTHROSCOPY Right    age [age]   TONSILLECTOMY  child    Social History   Socioeconomic History   Marital status: Married    Spouse name: Not on file   Number of children: 2   Years of education: Not on file   Highest education level: Not on file  Occupational History   Occupation: Statistician: [employer]  Tobacco Use   Smoking status: Former    Types: Cigarettes    Quit date: 05/21/2003    Years since quitting: 17.5   Smokeless tobacco: Never  Vaping Use   Vaping Use: Never used  Substance and Sexual Activity   Alcohol use: Not Currently    Alcohol/week: 0.0 standard  drinks    Comment: 0 per day since Jan 2,2019 (12-15 beers/d for a long time)   Drug use: Yes    Frequency: 2.0 times per week    Types: Marijuana    Comment: edible 2-3x/week   Sexual activity: Yes    Partners: Female  Other Topics Concern   Not on file  Social History Narrative   Lives with wife, 2 sons, 2 dogs. 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   Wife has melanoma; 01/2019 doing well on immunotherapy monthly.   Oldest attends [employer] boarding school in [place], 1 graduated from [employer] and will be attending [employer].      Updated 11/2020   Social Determinants of Health   Financial Resource Strain: Not on file  Food Insecurity: Not on file  Transportation Needs: Not on file  Physical Activity: Not  on file  Stress: Not on file  Social Connections: Not on file     FAMILY HISTORY:  We obtained a detailed, 4-generation family history.  Significant diagnoses are listed below: Family History  Problem Relation Age of Onset   Lung cancer Mother        smoking hx   Colon cancer Father        dx after 60   Colon polyps Father    Prostate cancer Father        dx 20s   Lung cancer Father        dx after 77; smoking hx   Colon polyps Sister 68       unknown number   Colon polyps Brother        unknown number   Lung cancer Paternal Uncle        smoking hx   Colon cancer Paternal Grandmother        dx after 21   Breast cancer Paternal Grandmother        dx after 79    Robert Hatfield is unaware of previous family history of genetic testing for hereditary cancer risks. There is no reported Ashkenazi Jewish ancestry. There is no known consanguinity.  GENETIC COUNSELING ASSESSMENT: Robert Hatfield is a 50 y.o. male with a personal and family history of cancer which is somewhat suggestive of a hereditary cancer syndrome and predisposition to cancer given the presence of related cancers in his family. We, therefore, discussed and recommended the following at today's visit.   DISCUSSION: We discussed that 5 - 10% of cancer is hereditary, with most cases of hereditary colon cancer associated with mutations in the Lynch syndrome genes.  We discussed his normal IHC results for mismatch repair.  There are other genes that can be associated with hereditary colon cancer and colon polyp syndromes.  Type of cancer risk and level of risk are gene-specific.  We discussed that testing is beneficial for several reasons including knowing how to follow individuals after completing their treatment, identifying whether potential treatment options would be beneficial, and understanding if other family members could be at risk for cancer and allowing them to undergo genetic testing.   We reviewed the characteristics,  features and inheritance patterns of hereditary cancer syndromes. We also discussed genetic testing, including the appropriate family members to test, the process of testing, insurance coverage and turn-around-time for results. We discussed the implications of a negative, positive, carrier and/or variant of uncertain significant result. We recommended Robert Hatfield pursue genetic testing for a panel that includes genes associated with colon, breast, and  prostate cancer.   Robert Hatfield  was offered a common hereditary cancer panel (47 genes) and an expanded pan-cancer panel (77 genes). Robert Hatfield was informed of the benefits and limitations of each panel, including that expanded pan-cancer panels contain genes that do not have clear management guidelines at this point in time.  We also discussed that as the number of genes included on a panel increases, the chances of variants of uncertain significance increases.  After considering the benefits and limitations of each gene panel, Robert Hatfield  elected to have an expanded pan-cancer panel through [place].  The CancerNext-Expanded gene panel offered by [facility] and includes sequencing, rearrangement, and RNA analysis for the following 77 genes: AIP, ALK, APC, ATM, AXIN2, BAP1, BARD1, BLM, BMPR1A, BRCA1, BRCA2, BRIP1, CDC73, CDH1, CDK4, CDKN1B, CDKN2A, CHEK2, CTNNA1, DICER1, FANCC, FH, FLCN, GALNT12, KIF1B, LZTR1, MAX, MEN1, MET, MLH1, MSH2, MSH3, MSH6, MUTYH, NBN, NF1, NF2, NTHL1, PALB2, PHOX2B, PMS2, POT1, PRKAR1A, PTCH1, PTEN, RAD51C, RAD51D, RB1, RECQL, RET, SDHA, SDHAF2, SDHB, SDHC, SDHD, SMAD4, SMARCA4, SMARCB1, SMARCE1, STK11, SUFU, TMEM127, TP53, TSC1, TSC2, VHL and XRCC2 (sequencing and deletion/duplication); EGFR, EGLN1, HOXB13, KIT, MITF, PDGFRA, POLD1, and POLE (sequencing only); EPCAM and GREM1 (deletion/duplication only).    Based on Robert Hatfield personal and family history of cancer, he meets medical criteria for genetic testing. Despite that he meets  criteria, he may still have an out of pocket cost. We discussed that if his out of pocket cost for testing is over $100, the laboratory will contact him to discuss self-pay prices or patient pay assistance programs.   PLAN: After considering the risks, benefits, and limitations, Robert Hatfield provided informed consent to pursue genetic testing and the blood sample was sent to [facility] for analysis of the CancerNext-Expanded +RNAinsight Panel. Results should be available within approximately 3 weeks' time, at which point they will be disclosed by telephone to Robert Hatfield, as will any additional recommendations warranted by these results. Robert Hatfield will receive a summary of his genetic counseling visit and a copy of his results once available. This information will also be available in Epic.   Lastly, we encouraged Robert Hatfield to remain in contact with cancer genetics annually so that we can continuously update the family history and inform him of any changes in cancer genetics and testing that may be of benefit for this family.   Robert Hatfield questions were answered to his satisfaction today. Our contact information was provided should additional questions or concerns arise. Thank you for the referral and allowing [place] to share in the care of your patient.   Robert Hatfield M. Joette Catching, [place], [facility] Genetic Counselor [email or web address] (P) [phone]  The patient was seen for a total of 25 minutes in face-to-face genetic counseling.  The patient was seen alone.  Drs. Magrinat, Lindi Adie and/or Burr Medico were available to discuss this case as needed.    _______________________________________________________________________ For Office Staff:  Number of people involved in session: 1 Was an Intern/ student involved with case: no

## 2020-12-19 ENCOUNTER — Other Ambulatory Visit: Payer: Self-pay

## 2020-12-19 ENCOUNTER — Ambulatory Visit
Admission: RE | Admit: 2020-12-19 | Discharge: 2020-12-19 | Disposition: A | Discharge status: Home / Self Care | Payer: BC Managed Care – PPO | Source: Ambulatory Visit | Attending: Radiation Oncology | Admitting: Radiation Oncology

## 2020-12-19 ENCOUNTER — Encounter: Payer: Self-pay | Admitting: Radiation Oncology

## 2020-12-19 VITALS — BP 111/74 | HR 60 | Temp 97.9°F | Resp 18 | Ht 72.0 in | Wt 184.6 lb

## 2020-12-19 DIAGNOSIS — Z800 Family history of malignant neoplasm of digestive organs: Secondary | ICD-10-CM | POA: Insufficient documentation

## 2020-12-19 DIAGNOSIS — Z803 Family history of malignant neoplasm of breast: Secondary | ICD-10-CM | POA: Insufficient documentation

## 2020-12-19 DIAGNOSIS — Z79899 Other long term (current) drug therapy: Secondary | ICD-10-CM | POA: Insufficient documentation

## 2020-12-19 DIAGNOSIS — J439 Emphysema, unspecified: Secondary | ICD-10-CM | POA: Diagnosis not present

## 2020-12-19 DIAGNOSIS — Z8042 Family history of malignant neoplasm of prostate: Secondary | ICD-10-CM | POA: Insufficient documentation

## 2020-12-19 DIAGNOSIS — C20 Malignant neoplasm of rectum: Secondary | ICD-10-CM | POA: Insufficient documentation

## 2020-12-19 DIAGNOSIS — Z87891 Personal history of nicotine dependence: Secondary | ICD-10-CM | POA: Insufficient documentation

## 2020-12-19 DIAGNOSIS — Z86010 Personal history of colonic polyps: Secondary | ICD-10-CM | POA: Diagnosis not present

## 2020-12-19 DIAGNOSIS — C186 Malignant neoplasm of descending colon: Secondary | ICD-10-CM

## 2020-12-19 DIAGNOSIS — I700 Atherosclerosis of aorta: Secondary | ICD-10-CM | POA: Insufficient documentation

## 2020-12-19 NOTE — Addendum Note (Signed)
Encounter addended by: Cori Razor, RN on: 12/19/2020 9:57 AM  Actions taken: Visit diagnoses modified

## 2020-12-20 ENCOUNTER — Other Ambulatory Visit: Payer: Self-pay

## 2020-12-20 ENCOUNTER — Encounter: Payer: Self-pay | Admitting: *Deleted

## 2020-12-20 NOTE — Progress Notes (Signed)
The proposed treatment discussed in conference is for discussion purpose only and is not a binding recommendation.  The patients have not been physically examined, or presented with their treatment options.  Therefore, final treatment plans cannot be decided.  

## 2020-12-20 NOTE — Progress Notes (Signed)
[facility] Psychosocial Distress Screening Clinical Social Work  Clinical Social Work was referred by distress screening protocol.  The patient scored a 5 on the Psychosocial Distress Thermometer which indicates moderate distress. Clinical Social Worker contacted patient by phone to assess for distress and other psychosocial needs.  Patient stated he was doing well.  Patient is still in the planning stage and waiting on a confirmed treatment plan.  CSW and patient discussed the importance of support throughout treatment.  CSW provided education on CSW role and the support team/programs at [facility].  Patient was also added to the monthly mailing list.  Patient did not express any concerns at this time.  CSW encouraged patient to call with any questions or concerns.     ONCBCN DISTRESS SCREENING 12/19/2020  Screening Type Initial Screening  Distress experienced in past week (1-10) 5  Family Problem type   Emotional problem type Adjusting to illness  Information Concerns Type   Other     Johnnye Lana, MSW, LCSW, OSW-C Clinical Social Worker [facility] [phone]

## 2020-12-21 DIAGNOSIS — C20 Malignant neoplasm of rectum: Secondary | ICD-10-CM | POA: Insufficient documentation

## 2020-12-21 NOTE — Progress Notes (Signed)
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   HISTORY OF PRESENT ILLNESS::Robert Hatfield is a 50 y.o. male who is seen for an initial consultation visit regarding the patient's diagnosis of rectal cancer.  The patient underwent recent colonoscopy and was found to have several polyps which were negative for carcinoma.  He also had findings of synchronous suspicious tumors within the descending colon at 40 cm as well as within the upper rectum.  Biopsy of the descending colon tumor returned positive for adenocarcinoma.  Biopsy of the proximal rectal tumor showed superficial tissue and was unable to confirm deeper invasive disease.  MRI scan of the pelvis was consistent with a T3N0 tumor.  The rectal tumor extended to 6 cm from the anal sphincter and corresponded to a length of 9 cm.  CT imaging did not show any evidence of metastatic disease.  A small 0.7 cm lesion was seen within the liver which was felt to be nonspecific and does not appear to be at high risk to represent metastatic disease.  The patient has seen surgery and also medical oncology and will have his case presented at tumor board conference tomorrow.  I have been asked to see the patient for discussion of potential radiation treatment as part of his overall treatment plan.    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     PAST SURGICAL HISTORY: Past Surgical History:  Procedure Laterality Date   COLONOSCOPY     DEBRIDEMENT TENNIS ELBOW Right 2000   HUMERUS FRACTURE SURGERY Right    age 67   KNEE ARTHROSCOPY Left    age 72-20   KNEE ARTHROSCOPY Right    age [age]   TONSILLECTOMY  child     FAMILY HISTORY: family history includes AAA (abdominal aortic aneurysm) in his father; Alcoholism in his mother; Breast cancer in his paternal grandmother; Colon cancer in his father and paternal grandmother; Colon polyps in his brother and father; Colon polyps (age of onset: 56) in his sister; Depression in his brother and mother; Hypertension in his father; Kidney disease in his father; Lung cancer in his father, mother, and paternal uncle; Prostate cancer in his father.   SOCIAL HISTORY:  reports that he quit smoking about 17 years ago. His smoking use included cigarettes. He has never used smokeless tobacco. He reports previous alcohol use. He reports current drug use. Frequency: 2.00 times per week. Drug: Marijuana.   ALLERGIES: Patient has no known allergies.   MEDICATIONS:  Current Outpatient Medications  Medication Sig Dispense Refill   ALPRAZolam (XANAX) 0.5 MG tablet Take 0.5-1 tablets (0.25-0.5 mg total) by mouth 3 (three) times daily as needed for anxiety. 45 tablet 0   cyanocobalamin 1000 MCG tablet Take 1,000 mcg by mouth daily.  Ibuprofen-Acetaminophen (ADVIL DUAL ACTION) 125-250 MG TABS Take 2 tablets by mouth as needed.     naproxen sodium (ALEVE) 220 MG tablet Take 440 mg by mouth 2 (two) times daily as needed.     Vitamin D, Ergocalciferol, (DRISDOL) 1.25 MG (50000 UNIT) CAPS capsule Take 1 capsule (50,000 Units total) by mouth every 7 (seven) days. 12 capsule 0   No current facility-administered medications for this  encounter.     REVIEW OF SYSTEMS:  A 15 point review of systems is documented in the electronic medical record. This was obtained by the nursing staff. However, I reviewed this with the patient to discuss relevant findings and make appropriate changes.  Pertinent items are noted in HPI.    PHYSICAL EXAM:  height is 6' (1.829 m) and weight is 184 lb 9.6 oz (83.7 kg). His temperature is 97.9 F (36.6 C). His blood pressure is 111/74 and his pulse is 60. His respiration is 18 and oxygen saturation is 100%.   ECOG = 1  0 - Asymptomatic (Fully active, able to carry on all predisease activities without restriction)  1 - Symptomatic but completely ambulatory (Restricted in physically strenuous activity but ambulatory and able to carry out work of a light or sedentary nature. For example, light housework, office work)  2 - Symptomatic, <50% in bed during the day (Ambulatory and capable of all self care but unable to carry out any work activities. Up and about more than 50% of waking hours)  3 - Symptomatic, >50% in bed, but not bedbound (Capable of only limited self-care, confined to bed or chair 50% or more of waking hours)  4 - Bedbound (Completely disabled. Cannot carry on any self-care. Totally confined to bed or chair)  5 - Death   Eustace Pen MM, Creech RH, Tormey DC, et al. [phone]). "Toxicity and response criteria of the [facility]". [place] Oncol. 5 (6): 649-55  Alert, no distress   LABORATORY DATA:  Lab Results  Component Value Date   WBC 5.6 11/22/2020   HGB 14.7 11/22/2020   HCT 43.1 11/22/2020   MCV 89.3 11/22/2020   PLT 314.0 11/22/2020   Lab Results  Component Value Date   NA 139 11/22/2020   K 3.9 11/22/2020   CL 103 11/22/2020   CO2 29 11/22/2020   Lab Results  Component Value Date   ALT 17 11/22/2020   AST 17 11/22/2020   ALKPHOS 57 11/22/2020   BILITOT 0.5 11/22/2020      RADIOGRAPHY: CT CHEST W CONTRAST  Result Date:  11/29/2020 CLINICAL DATA:  Colorectal cancer. EXAM: CT CHEST, ABDOMEN, AND PELVIS WITH CONTRAST TECHNIQUE: Multidetector CT imaging of the chest, abdomen and pelvis was performed following the standard protocol during bolus administration of intravenous contrast. CONTRAST:  17m OMNIPAQUE IOHEXOL 350 MG/ML SOLN COMPARISON:  None. FINDINGS: CT CHEST FINDINGS Cardiovascular: Left anterior descending coronary artery calcification. Heart is at the upper limits of normal in size. No pericardial effusion. Mediastinum/Nodes: No pathologically enlarged mediastinal, hilar or axillary lymph nodes. Esophagus is unremarkable. Lungs/Pleura: Mild paraseptal emphysema. Minimal dependent atelectasis bilaterally. 3 mm subpleural left lower lobe nodule (4/117), likely a benign subpleural lymph node. Lungs are otherwise clear. No pleural fluid. Airway is unremarkable. Musculoskeletal: Degenerative changes in the spine. No worrisome lytic or sclerotic lesions. CT ABDOMEN PELVIS FINDINGS Hepatobiliary: Liver is minimally decreased in attenuation diffusely. 7 mm low-attenuation lesion in segment 4 of the liver (2/65), too small to characterize. Liver and gallbladder are  otherwise unremarkable. No biliary ductal dilatation. Pancreas: Negative. Spleen: Negative. Adrenals/Urinary Tract: Adrenal glands and kidneys are unremarkable. Ureters are decompressed. Bladder is grossly unremarkable. Stomach/Bowel: Stomach, small bowel, appendix and majority of the colon are unremarkable. Fluid and stool are seen in the rectosigmoid colon. Vascular/Lymphatic: Atherosclerotic calcification of the aorta. No pathologically enlarged lymph nodes. Reproductive: Prostate is normal in size. Other: No free fluid. Mesenteries and peritoneum are unremarkable. Tiny umbilical hernia contains fat. Musculoskeletal: Mild degenerative changes in the spine and hips. No worrisome lytic or sclerotic lesions. IMPRESSION: 1. No definitive evidence of metastatic disease. 2.  7 mm low-attenuation lesion in segment 4 of the liver, too small to characterize. 3. Liver appears slightly steatotic. 4. Aortic atherosclerosis (ICD10-I70.0). Left anterior descending coronary artery calcification. 5.  Emphysema (ICD10-J43.9). Electronically Signed   By: Lorin Picket M.D.   On: 11/29/2020 08:45   MR PELVIS WO CONTRAST  Result Date: 12/11/2020 CLINICAL DATA:  Colorectal cancer staging, adenocarcinoma of the colon in a 50 year old male EXAM: MRI PELVIS WITHOUT CONTRAST TECHNIQUE: Multiplanar multisequence MR imaging of the pelvis was performed. No intravenous contrast was administered. Ultrasound gel was administered per rectum to optimize tumor evaluation. COMPARISON:  CT chest, abdomen and pelvis dated November 27, 2020. FINDINGS: TUMOR LOCATION Tumor distance from Anal Verge/Skin Surface:  12.1 cm Tumor distance to Internal Anal Sphincter: 6.4 cm TUMOR DESCRIPTION Circumferential Extent: Circumferential, irregular long segment mass with multinodular appearance Tumor Length: 9 cm T - CATEGORY Extension through Muscularis Propria: Yes, at least 3 mm extension beyond the muscularis best seen on image 37 of series 9 along the LEFT anterolateral aspect of the rectum. Suspect that there is also extension beyond the muscularis on image 34 of series 9 up to 6 mm though the presence of motion does limit assessment in this area. Another area of suspicion on image 44 of series 9, redundant nature of the rectum at this level limits assessment. Shortest Distance of any tumor/node from Mesorectal Fascia: Rectum is distended difficult to determine the closest margin in the absence of distension. No tumor extending beyond the muscularis frankly abutting the mesorectal fascia, potentially within 2-3 mm on the LEFT of the mesorectal fascia. Extramural Vascular Invasion/Tumor Thrombus: No Invasion of Anterior Peritoneal Reflection: No Involvement of Adjacent Organs or Pelvic Sidewall: No Levator Ani Involvement:  No N - CATEGORY Mesorectal Lymph Nodes >=32m: None,N0 Extra-mesorectal Lymphadenopathy: None visualized Other:  None. IMPRESSION: Rectal adenocarcinoma T stage: At least T3 B tumor, potentially T3 C as described. Frank extension beyond muscularis noticed on the LEFT with suspected extension on the RIGHT, potentially up to 6 mm. Tumor begins in the mid rectum and extends nearly 10 cm cephalad. Rectal adenocarcinoma N stage:  N0 Distance from tumor to the internal anal sphincter is 6.4 cm. Electronically Signed   By: GZetta BillsM.D.   On: 12/11/2020 13:26   CT Abdomen Pelvis W Contrast  Result Date: 11/29/2020 CLINICAL DATA:  Colorectal cancer. EXAM: CT CHEST, ABDOMEN, AND PELVIS WITH CONTRAST TECHNIQUE: Multidetector CT imaging of the chest, abdomen and pelvis was performed following the standard protocol during bolus administration of intravenous contrast. CONTRAST:  879mOMNIPAQUE IOHEXOL 350 MG/ML SOLN COMPARISON:  None. FINDINGS: CT CHEST FINDINGS Cardiovascular: Left anterior descending coronary artery calcification. Heart is at the upper limits of normal in size. No pericardial effusion. Mediastinum/Nodes: No pathologically enlarged mediastinal, hilar or axillary lymph nodes. Esophagus is unremarkable. Lungs/Pleura: Mild paraseptal emphysema. Minimal dependent atelectasis bilaterally. 3 mm subpleural left lower lobe  nodule (4/117), likely a benign subpleural lymph node. Lungs are otherwise clear. No pleural fluid. Airway is unremarkable. Musculoskeletal: Degenerative changes in the spine. No worrisome lytic or sclerotic lesions. CT ABDOMEN PELVIS FINDINGS Hepatobiliary: Liver is minimally decreased in attenuation diffusely. 7 mm low-attenuation lesion in segment 4 of the liver (2/65), too small to characterize. Liver and gallbladder are otherwise unremarkable. No biliary ductal dilatation. Pancreas: Negative. Spleen: Negative. Adrenals/Urinary Tract: Adrenal glands and kidneys are unremarkable. Ureters  are decompressed. Bladder is grossly unremarkable. Stomach/Bowel: Stomach, small bowel, appendix and majority of the colon are unremarkable. Fluid and stool are seen in the rectosigmoid colon. Vascular/Lymphatic: Atherosclerotic calcification of the aorta. No pathologically enlarged lymph nodes. Reproductive: Prostate is normal in size. Other: No free fluid. Mesenteries and peritoneum are unremarkable. Tiny umbilical hernia contains fat. Musculoskeletal: Mild degenerative changes in the spine and hips. No worrisome lytic or sclerotic lesions. IMPRESSION: 1. No definitive evidence of metastatic disease. 2. 7 mm low-attenuation lesion in segment 4 of the liver, too small to characterize. 3. Liver appears slightly steatotic. 4. Aortic atherosclerosis (ICD10-I70.0). Left anterior descending coronary artery calcification. 5.  Emphysema (ICD10-J43.9). Electronically Signed   By: Lorin Picket M.D.   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  Initial biopsy of the rectal tumor was superficial and he is undergoing a repeat biopsy of this tumor on Friday.  On MRI scan, this appeared to represent most likely at T3 N0 tumor although this does not appear to be definitive from a radiologic standpoint.  A second biopsy should be very helpful.  I discussed with the patient the potential role of radiation treatment.  We discussed that it does not typically have a standard role in the management for colon cancer but often is given preoperatively in the setting of a rectal tumor.  If the patient's biopsy does confirm invasive disease, then we discussed that standard treatment typically would involve try modality therapy for this tumor in isolation which would consist of preoperative radiation treatment.  However, a complicating factor is the additional tumor in the descending colon.  We will need to  coordinate with all of the potential modalities.  The downside of delayed surgery for the colon tumor would need to be weighed against the downside of proceeding directly for resection of a rectal tumor which is potentially locally advanced.  It is favorable though that significant lymphadenopathy has not been seen and also the proximal location of the tumor also is favorable in terms of local control.  We will proceed with this additional work-up with repeat biopsy and further discussion after this result is available.  If a decision is made to proceed with radiation treatment initially, then the patient could proceed with a simulation next week for treatment planning.  The patient was seen in person today in clinic.  The total time spent on the patient's visit today was 45 minutes, including chart review, direct discussion/evaluation with the patient, and coordination of care.        ________________________________   Jodelle Gross, MD, PhD   **Disclaimer: This note was dictated with voice recognition software. Similar sounding words can inadvertently be transcribed and this note may contain transcription errors which may not have been corrected upon publication of note.**

## 2020-12-22 ENCOUNTER — Other Ambulatory Visit: Payer: Self-pay

## 2020-12-22 ENCOUNTER — Encounter: Payer: Self-pay | Admitting: Internal Medicine

## 2020-12-22 ENCOUNTER — Ambulatory Visit (AMBULATORY_SURGERY_CENTER): Payer: BC Managed Care – PPO | Admitting: Internal Medicine

## 2020-12-22 VITALS — BP 107/57 | HR 64 | Temp 99.1°F | Resp 10 | Ht 73.25 in | Wt 183.0 lb

## 2020-12-22 DIAGNOSIS — D128 Benign neoplasm of rectum: Secondary | ICD-10-CM

## 2020-12-22 DIAGNOSIS — Z85038 Personal history of other malignant neoplasm of large intestine: Secondary | ICD-10-CM | POA: Diagnosis not present

## 2020-12-22 DIAGNOSIS — C20 Malignant neoplasm of rectum: Secondary | ICD-10-CM

## 2020-12-22 HISTORY — PX: SIGMOIDOSCOPY: SUR1295

## 2020-12-22 MED ORDER — SODIUM CHLORIDE 0.9 % IV SOLN: 500.0000 mL | Freq: Once | INTRAVENOUS | Status: DC

## 2020-12-22 NOTE — Progress Notes (Signed)
Pt's states no medical or surgical changes since previsit or office visit.  ° °VS DT °

## 2020-12-22 NOTE — Patient Instructions (Signed)
Awaiting pathology results. Follow up with Dr. Dema Severin as planned.  YOU HAD AN ENDOSCOPIC PROCEDURE TODAY AT [facility]:   Refer to the procedure report that was given to you for any specific questions about what was found during the examination.  If the procedure report does not answer your questions, please call your gastroenterologist to clarify.  If you requested that your care partner not be given the details of your procedure findings, then the procedure report has been included in a sealed envelope for you to review at your convenience later.  YOU SHOULD EXPECT: Some feelings of bloating in the abdomen. Passage of more gas than usual.  Walking can help get rid of the air that was put into your GI tract during the procedure and reduce the bloating. If you had a lower endoscopy (such as a colonoscopy or flexible sigmoidoscopy) you may notice spotting of blood in your stool or on the toilet paper. If you underwent a bowel prep for your procedure, you may not have a normal bowel movement for a few days.  Please Note:  You might notice some irritation and congestion in your nose or some drainage.  This is from the oxygen used during your procedure.  There is no need for concern and it should clear up in a day or so.  SYMPTOMS TO REPORT IMMEDIATELY:  Following lower endoscopy (colonoscopy or flexible sigmoidoscopy):  Excessive amounts of blood in the stool  Significant tenderness or worsening of abdominal pains  Swelling of the abdomen that is new, acute  Fever of 100F or higher   For urgent or emergent issues, a gastroenterologist can be reached at any hour by calling [phone]. Do not use MyChart messaging for urgent concerns.    DIET:  We do recommend a small meal at first, but then you may proceed to your regular diet.  Drink plenty of fluids but you should avoid alcoholic beverages for 24 hours.  ACTIVITY:  You should plan to take it easy for the rest of today and  you should NOT DRIVE or use heavy machinery until tomorrow (because of the sedation medicines used during the test).    FOLLOW UP: Our staff will call the number listed on your records 48-72 hours following your procedure to check on you and address any questions or concerns that you may have regarding the information given to you following your procedure. If we do not reach you, we will leave a message.  We will attempt to reach you two times.  During this call, we will ask if you have developed any symptoms of COVID 19. If you develop any symptoms (ie: fever, flu-like symptoms, shortness of breath, cough etc.) before then, please call [phone].  If you test positive for Covid 19 in the 2 weeks post procedure, please call and report this information to [place].    If any biopsies were taken you will be contacted by phone or by letter within the next 1-3 weeks.  Please call us at [phone] if you have not heard about the biopsies in 3 weeks.    SIGNATURES/CONFIDENTIALITY: You and/or your care partner have signed paperwork which will be entered into your electronic medical record.  These signatures attest to the fact that that the information above on your After Visit Summary has been reviewed and is understood.  Full responsibility of the confidentiality of this discharge information lies with you and/or your care-partner.

## 2020-12-22 NOTE — Progress Notes (Signed)
Called to room to assist during endoscopic procedure.  Patient ID and intended procedure confirmed with present staff. Received instructions for my participation in the procedure from the performing physician.  

## 2020-12-22 NOTE — Progress Notes (Signed)
pt tolerated well. VSS. awake and to recovery. Report given to RN.  

## 2020-12-22 NOTE — Op Note (Signed)
[facility] Patient Name: Robert Hatfield Procedure Date: 12/22/2020 3:28 PM MRN: [id number] Endoscopist: Docia Chuck. Henrene Pastor , MD Age: 50 Referring MD:  Date of Birth: 1970-06-16 Gender: Male Account #: [id number] Procedure:                Flexible Sigmoidoscopy with hot snare polypectomies Indications:              Preoperative assessment. Known large rectal mass                            with high-grade dysplasia. Medicines:                Monitored Anesthesia Care Procedure:                Pre-Anesthesia Assessment:                           - Prior to the procedure, a History and Physical                            was performed, and patient medications and                            allergies were reviewed. The patient's tolerance of                            previous anesthesia was also reviewed. The risks                            and benefits of the procedure and the sedation                            options and risks were discussed with the patient.                            All questions were answered, and informed consent                            was obtained. Prior Anticoagulants: The patient has                            taken no previous anticoagulant or antiplatelet                            agents. ASA Grade Assessment: II - A patient with                            mild systemic disease. After reviewing the risks                            and benefits, the patient was deemed in                            satisfactory condition to undergo the procedure.  After obtaining informed consent, the scope was                            passed under direct vision. The [id number]-HQ190L                            Colonoscope was introduced through the anus and                            advanced to the the sigmoid colon. The flexible                            sigmoidoscopy was accomplished without difficulty.                            The patient  tolerated the procedure well. Scope In: Scope Out: Findings:                 The digital rectal exam was normal. The tip of the                            tumor could just be felt.                           A bulky mass was found in the distal rectum. This                            began at approximately 3 to 4 cm from the anal                            verge. It was soft with villous appearance and                            slightly friable. This extended proximal for about                            10 or 11 cm. The proximal portion marking tattoo                            was identified. See images. Large areas of the                            polypoid mass were removed with a hot snare. This                            was taken from the bulky distal portion as well as                            the bulky proximal portions. Tissue was retrieved                            with Jabier Mutton net and submitted for pathologic  analysis. . Complications:            No immediate complications. Estimated Blood Loss:     Estimated blood loss: none. Impression:               - Tumor in the rectum. Large portions of the                            proximal and distal ends were removed with hot                            snare cautery and submitted for pathologic analysis. Recommendation:           1. Await pathology results.                           2. Follow-up with Dr. Dema Severin as planned Docia Chuck. Henrene Pastor, MD 12/22/2020 4:15:56 PM This report has been signed electronically.

## 2020-12-26 ENCOUNTER — Telehealth: Payer: Self-pay | Admitting: *Deleted

## 2020-12-26 ENCOUNTER — Ambulatory Visit: Payer: BC Managed Care – PPO | Admitting: Oncology

## 2020-12-26 DIAGNOSIS — C186 Malignant neoplasm of descending colon: Secondary | ICD-10-CM | POA: Diagnosis not present

## 2020-12-26 DIAGNOSIS — K6289 Other specified diseases of anus and rectum: Secondary | ICD-10-CM | POA: Diagnosis not present

## 2020-12-26 NOTE — Telephone Encounter (Signed)
  Follow up Call-  Call back number 12/22/2020 11/16/2020  Post procedure Call Back phone  # [phone]  Permission to leave phone message Yes Yes  Some recent data might be hidden     Patient questions:  Do you have a fever, pain , or abdominal swelling? No. Pain Score  0 *  Have you tolerated food without any problems? Yes.    Have you been able to return to your normal activities? Yes.    Do you have any questions about your discharge instructions: Diet   No. Medications  No. Follow up visit  No.  Do you have questions or concerns about your Care? No.  Actions: * If pain score is 4 or above: No action needed, pain <4.  Have you developed a fever since your procedure? no  2.   Have you had an respiratory symptoms (SOB or cough) since your procedure? no  3.   Have you tested positive for COVID 19 since your procedure no  4.   Have you had any family members/close contacts diagnosed with the COVID 19 since your procedure?  no   If yes to any of these questions please route to Joylene John, RN and Joella Prince, RN

## 2020-12-27 ENCOUNTER — Inpatient Hospital Stay: Payer: BC Managed Care – PPO

## 2020-12-27 ENCOUNTER — Other Ambulatory Visit: Payer: Self-pay

## 2020-12-27 ENCOUNTER — Inpatient Hospital Stay: Payer: BC Managed Care – PPO | Attending: Oncology | Admitting: Oncology

## 2020-12-27 VITALS — BP 107/75 | HR 63 | Temp 97.8°F | Resp 20 | Ht 73.0 in | Wt 183.4 lb

## 2020-12-27 DIAGNOSIS — Z79899 Other long term (current) drug therapy: Secondary | ICD-10-CM | POA: Diagnosis not present

## 2020-12-27 DIAGNOSIS — Z87891 Personal history of nicotine dependence: Secondary | ICD-10-CM | POA: Insufficient documentation

## 2020-12-27 DIAGNOSIS — E538 Deficiency of other specified B group vitamins: Secondary | ICD-10-CM | POA: Diagnosis not present

## 2020-12-27 DIAGNOSIS — J449 Chronic obstructive pulmonary disease, unspecified: Secondary | ICD-10-CM | POA: Insufficient documentation

## 2020-12-27 DIAGNOSIS — C20 Malignant neoplasm of rectum: Secondary | ICD-10-CM | POA: Diagnosis not present

## 2020-12-27 DIAGNOSIS — C189 Malignant neoplasm of colon, unspecified: Secondary | ICD-10-CM

## 2020-12-27 LAB — CMP (CANCER CENTER ONLY)
ALT: 13 U/L (ref 0–44)
AST: 15 U/L (ref 15–41)
Albumin: 4.2 g/dL (ref 3.5–5.0)
Alkaline Phosphatase: 51 U/L (ref 38–126)
Anion gap: 7 (ref 5–15)
BUN: 13 mg/dL (ref 6–20)
CO2: 27 mmol/L (ref 22–32)
Calcium: 9 mg/dL (ref 8.9–10.3)
Chloride: 104 mmol/L (ref 98–111)
Creatinine: 0.68 mg/dL (ref 0.61–1.24)
GFR, Estimated: >60 mL/min (ref >60)
Glucose, Bld: 96 mg/dL (ref 70–99)
Potassium: 4 mmol/L (ref 3.5–5.1)
Sodium: 138 mmol/L (ref 135–145)
Total Bilirubin: 0.6 mg/dL (ref 0.3–1.2)
Total Protein: 7.1 g/dL (ref 6.5–8.1)

## 2020-12-27 MED ORDER — CAPECITABINE 500 MG PO TABS
ORAL_TABLET | ORAL | 0 refills | Status: DC
  Filled 2020-12-27: qty 196, fill #0

## 2020-12-27 NOTE — Progress Notes (Signed)
[facility] OFFICE PROGRESS NOTE   Diagnosis: Rectal cancer  INTERVAL HISTORY:   [name] returns as scheduled.  He underwent a sigmoidoscopy by Dr. Henrene Pastor on 12/22/2020.  A mass was found in the distal rectum beginning at 3-4 cm from anal verge.  The mass was soft with a villous appearance and extended proximally for 10 or 11 cm.  Areas of the polypoid mass were removed with a hot snare.  Tumor was removed from the proximal and distal portion.  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  Lymphovascular invasion was noted.  Robert Hatfield saw Dr. Dema Severin yesterday.  Dr. Dema Severin recommends proceeding with neoadjuvant therapy.  Robert Hatfield feels well.  He has intermittent diarrhea.  He has occasional mild abdominal discomfort.  No other complaint.  No bleeding.  Objective:  Vital signs in last 24 hours:  Blood pressure 107/75, pulse 63, temperature 97.8 F (36.6 C), temperature source Oral, resp. rate 20, height 6' 1"  (1.854 m), weight 83.2 kg, SpO2 100 %.    Resp: Lungs clear bilaterally Cardio: Regular rate and rhythm GI: No hepatosplenomegaly, nontender, no mass Vascular: No leg edema   Lab Results:  Lab Results  Component Value Date   WBC 5.6 11/22/2020   HGB 14.7 11/22/2020   HCT 43.1 11/22/2020   MCV 89.3 11/22/2020   PLT 314.0 11/22/2020   NEUTROABS 2.5 11/22/2020    CMP  Lab Results  Component Value Date   NA 139 11/22/2020   K 3.9 11/22/2020   CL 103 11/22/2020   CO2 29 11/22/2020   GLUCOSE 97 11/22/2020   BUN 13 11/22/2020   CREATININE 0.76 11/22/2020   CALCIUM 9.2 11/22/2020   PROT 7.2 11/22/2020   ALBUMIN 4.4 11/22/2020   AST 17 11/22/2020   ALT 17 11/22/2020   ALKPHOS 57 11/22/2020   BILITOT 0.5 11/22/2020   GFRNONAA 103 11/17/2019   GFRAA 119 11/17/2019    Lab Results  Component Value Date   CEA 1.9 11/22/2020     Medications: I have reviewed the patient's current  medications.   Assessment/Plan: Colon cancer Biopsy of a descending colon mass at 40 cm revealed adenocarcinoma, normal mismatch repair protein expression Rectal mass Circumferential mass beginning at 4 cm from the anal verge-biopsy tubulovillous adenoma with high-grade dysplasia CT 11/27/2020-indeterminate 7 mm segment 4 liver lesion, no other evidence of metastatic disease MRI pelvis 12/10/2020-tumor at 12.1 cm from the anal verge, 6.4 cm from the internal anal sphincter, T3b versus T3c, N0 Sigmoidoscopy-biopsy of rectal mass 12/22/2020-distal edge biopsy, tubulovillous adenoma; proximal edge biopsy focal 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   3.  COPD 4.  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  Family history of colon polyps 6.  Multiple colon polyps on the colonoscopy 11/16/2020-tubular adenomas 7.  Remote history of tobacco use 8.  Remote history of heavy alcohol use 9.  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  We discussed treatment options again today.  I communicated with Drs. White and [place] today.  The plan is to proceed with neoadjuvant therapy.  I recommend concurrent capecitabine and radiation.  We will review the final surgical pathology and decide on the indication for adjuvant systemic therapy.  I reviewed potential toxicities associated with  capecitabine including the chance of nausea, mucositis, diarrhea, alopecia, and hematologic toxicity.  We discussed the rash, sun sensitivity, hand/foot syndrome, and hypersegmentation associated with capecitabine.  We reviewed the potential for cardiac toxicity.  He agrees to proceed.  He will be contacted by the Cancer center pharmacist for further discussion regarding capecitabine side effects and to  arrange for drug delivery.  He will see Dr. Lisbeth Renshaw for radiation simulation.  The plan is to begin concurrent capecitabine and radiation on 01/08/2021.  He will return for an office and lab visit on 01/24/2021.  Betsy Coder, MD  12/27/2020  9:05 AM

## 2020-12-28 ENCOUNTER — Telehealth: Payer: Self-pay | Admitting: Pharmacy Technician

## 2020-12-28 ENCOUNTER — Telehealth: Payer: Self-pay | Admitting: Radiation Oncology

## 2020-12-28 ENCOUNTER — Other Ambulatory Visit (HOSPITAL_COMMUNITY): Payer: Self-pay

## 2020-12-28 NOTE — Telephone Encounter (Signed)
I called the patient to confirm the plan to proceed with chemoRT followed by surgery to both his rectal cancer and colon cancer, additional chemotherapy would follow surgery per Dr. Benay Spice. He will come in for simulation next Monday and we will plan to begin treatment on 01/08/21.     Carola Rhine, PAC

## 2020-12-28 NOTE — Telephone Encounter (Signed)
Oral Oncology Patient Advocate Encounter   Received notification from [facility] that prior authorization for Xeloda is required.   PA submitted on CoverMyMeds on 12/29/20 Key [id number] Status is pending   Oral Oncology Clinic will continue to follow.  [place] Patient [place] Phone [phone] Fax [phone] 01/03/2021 8:14 AM

## 2020-12-29 ENCOUNTER — Encounter: Payer: Self-pay | Admitting: Genetic Counselor

## 2020-12-29 ENCOUNTER — Telehealth: Payer: Self-pay | Admitting: Genetic Counselor

## 2020-12-29 ENCOUNTER — Other Ambulatory Visit (HOSPITAL_COMMUNITY): Payer: Self-pay

## 2020-12-29 ENCOUNTER — Ambulatory Visit: Payer: Self-pay | Admitting: Genetic Counselor

## 2020-12-29 ENCOUNTER — Telehealth: Payer: Self-pay | Admitting: Pharmacist

## 2020-12-29 DIAGNOSIS — Z801 Family history of malignant neoplasm of trachea, bronchus and lung: Secondary | ICD-10-CM

## 2020-12-29 DIAGNOSIS — C20 Malignant neoplasm of rectum: Secondary | ICD-10-CM

## 2020-12-29 DIAGNOSIS — Z803 Family history of malignant neoplasm of breast: Secondary | ICD-10-CM

## 2020-12-29 DIAGNOSIS — Z8371 Family history of colonic polyps: Secondary | ICD-10-CM

## 2020-12-29 DIAGNOSIS — Z86010 Personal history of colonic polyps: Secondary | ICD-10-CM

## 2020-12-29 DIAGNOSIS — C189 Malignant neoplasm of colon, unspecified: Secondary | ICD-10-CM

## 2020-12-29 DIAGNOSIS — Z1379 Encounter for other screening for genetic and chromosomal anomalies: Secondary | ICD-10-CM | POA: Insufficient documentation

## 2020-12-29 DIAGNOSIS — Z8042 Family history of malignant neoplasm of prostate: Secondary | ICD-10-CM

## 2020-12-29 DIAGNOSIS — Z800 Family history of malignant neoplasm of digestive organs: Secondary | ICD-10-CM

## 2020-12-29 NOTE — Telephone Encounter (Signed)
Revealed negative genetic testing.  Discussed that we do not know why he has colon and rectal cancer or why there is cancer in the family. It could be due to a different gene that we are not testing, or maybe our current technology may not be able to pick something up.  It will be important for him to keep in contact with genetics to keep up with whether additional testing may be needed.

## 2020-12-29 NOTE — Telephone Encounter (Signed)
Oral Oncology Pharmacist Encounter  Received new prescription for Xeloda (capecitabine) for the neoadjuvant treatment of stage II rectal cancer and descending colon cancer in conjunction with XRT, planned duration until the end of XRT. Planned start 01/08/21  CMP from 11/22/20 assessed, no relevant lab abnormalities. Prescription dose and frequency assessed.   Current medication list in Epic reviewed, no DDIs with capecitabine identified.  Evaluated chart and no patient barriers to medication adherence identified.   Prescription has been e-scribed to the [facility] for benefits analysis and approval.  Oral Oncology Clinic will continue to follow for insurance authorization, copayment issues, initial counseling and start date.  Patient agreed to treatment on 12/27/20 per MD documentation.  Darl Pikes, PharmD, BCPS, BCOP, CPP Hematology/Oncology Clinical Pharmacist Practitioner [facility] [phone]  12/29/2020 12:38 PM

## 2020-12-31 NOTE — Progress Notes (Signed)
HPI:  [name] was previously seen in the [facility] clinic due to a personal and family history of cancer and concerns regarding a hereditary predisposition to cancer. Please refer to our prior cancer genetics clinic note for more information regarding our discussion, assessment and recommendations, at the time. Robert Hatfield recent genetic test results were disclosed to him, as were recommendations warranted by these results. These results and recommendations are discussed in more detail below.  CANCER HISTORY:  In 2022, at the age of 50, Robert Hatfield was diagnosed with adenocarcinoma of the descending colon with normal mismatch repair protein expression and a rectal mass with focal invasive adenocarcinoma arising in a tubulovillous adenoma with high grade dysplasia, lymphovascular invasion present.    Oncology History  Adenocarcinoma of colon (Robert Hatfield)  11/22/2020 Initial Diagnosis   Adenocarcinoma of colon (Robert Hatfield)   12/06/2020 Cancer Staging   Staging form: Colon and Rectum, AJCC 8th Edition - Clinical: Stage Unknown (cTX, cNX, cM0) - Signed by Robert Pier, MD on 12/06/2020 Stage prefix: Initial diagnosis   12/27/2020 Genetic Testing   Negative hereditary cancer genetic testing: no pathogenic variants detected in [facility] CancerNext-Expanded +RNAinsight Panel.  The report date is December 27, 2020.    The CancerNext-Expanded gene panel offered by [facility] and includes sequencing, rearrangement, and RNA analysis for the following 77 genes: AIP, ALK, APC, ATM, AXIN2, BAP1, BARD1, BLM, BMPR1A, BRCA1, BRCA2, BRIP1, CDC73, CDH1, CDK4, CDKN1B, CDKN2A, CHEK2, CTNNA1, DICER1, FANCC, FH, FLCN, GALNT12, KIF1B, LZTR1, MAX, MEN1, MET, MLH1, MSH2, MSH3, MSH6, MUTYH, NBN, NF1, NF2, NTHL1, PALB2, PHOX2B, PMS2, POT1, PRKAR1A, PTCH1, PTEN, RAD51C, RAD51D, RB1, RECQL, RET, SDHA, SDHAF2, SDHB, SDHC, SDHD, SMAD4, SMARCA4, SMARCB1, SMARCE1, STK11, SUFU, TMEM127, TP53, TSC1, TSC2, VHL and XRCC2 (sequencing  and deletion/duplication); EGFR, EGLN1, HOXB13, KIT, MITF, PDGFRA, POLD1, and POLE (sequencing only); EPCAM and GREM1 (deletion/duplication only).     FAMILY HISTORY:  We obtained a detailed, 4-generation family history.  Significant diagnoses are listed below:      Family History  Problem Relation Age of Onset   Lung cancer Mother          smoking hx   Colon cancer Father          dx after 71   Colon polyps Father     Prostate cancer Father          dx 71s   Lung cancer Father          dx after 28; smoking hx   Colon polyps Sister 40        unknown number   Colon polyps Brother          unknown number   Lung cancer Paternal Uncle          smoking hx   Colon cancer Paternal Grandmother          dx after 70   Breast cancer Paternal Grandmother          dx after 67     Robert Hatfield is unaware of previous family history of genetic testing for hereditary cancer risks. There is no reported Ashkenazi Jewish ancestry. There is no known consanguinity.   GENETIC TEST RESULTS: Genetic testing reported out on December 27, 2020.  The [facility] CancerNext-Expanded +RNAinsight panel found no pathogenic mutations. The CancerNext-Expanded gene panel offered by [facility] and includes sequencing, rearrangement, and RNA analysis for the following 77 genes: AIP, ALK, APC, ATM, AXIN2, BAP1, BARD1, BLM, BMPR1A, BRCA1, BRCA2, BRIP1, CDC73,  CDH1, CDK4, CDKN1B, CDKN2A, CHEK2, CTNNA1, DICER1, FANCC, FH, FLCN, GALNT12, KIF1B, LZTR1, MAX, MEN1, MET, MLH1, MSH2, MSH3, MSH6, MUTYH, NBN, NF1, NF2, NTHL1, PALB2, PHOX2B, PMS2, POT1, PRKAR1A, PTCH1, PTEN, RAD51C, RAD51D, RB1, RECQL, RET, SDHA, SDHAF2, SDHB, SDHC, SDHD, SMAD4, SMARCA4, SMARCB1, SMARCE1, STK11, SUFU, TMEM127, TP53, TSC1, TSC2, VHL and XRCC2 (sequencing and deletion/duplication); EGFR, EGLN1, HOXB13, KIT, MITF, PDGFRA, POLD1, and POLE (sequencing only); EPCAM and GREM1 (deletion/duplication only). The test report has been scanned into EPIC and is located  under the Molecular Pathology section of the Results Review tab.  A portion of the result report is included below for reference.     We discussed with Robert Hatfield that because current genetic testing is not perfect, it is possible there may be a gene mutation in one of these genes that current testing cannot detect, but that chance is small.  We also discussed, that there could be another gene that has not yet been discovered, or that we have not yet tested, that is responsible for the cancer diagnoses in the family. It is also possible there is a hereditary cause for the cancer in the family that Robert Hatfield did not inherit and therefore was not identified in his testing.  Therefore, it is important to remain in touch with cancer genetics in the future so that we can continue to offer Robert Hatfield the most up to date genetic testing.   ADDITIONAL GENETIC TESTING: We discussed with Robert Hatfield that his genetic testing was fairly extensive.  If there are genes identified to increase cancer risk that can be analyzed in the future, we would be happy to discuss and coordinate this testing at that time.    CANCER SCREENING RECOMMENDATIONS: Robert Hatfield test result is considered negative (normal).  This means that we have not identified a hereditary cause for his personal history of cancer at this time. Most cancers happen by chance and this negative test suggests that his cancer may fall into this category.    Given Robert Hatfield personal and family histories, we must interpret these negative results with some caution.  Families with features suggestive of hereditary risk for cancer tend to have multiple family members with cancer, diagnoses in multiple generations and diagnoses before the age of 52. Robert Hatfield family exhibits some of these features. Thus, this result may simply reflect our current inability to detect all mutations within these genes or there may be a different gene that has not yet been discovered or  tested.   An individual's cancer risk and medical management are not determined by genetic test results alone. Overall cancer risk assessment incorporates additional factors, including personal medical history, family history, and any available genetic information that may result in a personalized plan for cancer prevention and surveillance.  RECOMMENDATIONS FOR FAMILY MEMBERS:  Individuals in this family might be at some increased risk of developing cancer, over the general population risk, simply due to the family history of cancer.  First degree relatives of those with colon cancer should receive colonoscopies beginning at age 52, or 10 years prior to the earliest diagnosis of colon cancer in the family, and receive colonoscopies at least every 5 years, or as recommended by their gastroenterologist.  We recommended women in this family have a yearly mammogram beginning at age 28, or 77 years younger than the earliest onset of cancer, an annual clinical breast exam, and perform monthly breast self-exams. Women in this family should also have a gynecological exam as recommended by  their primary provider.   FOLLOW-UP: Lastly, we discussed with Robert Hatfield that cancer genetics is a rapidly advancing field and it is possible that new genetic tests will be appropriate for him and/or his family members in the future. We encouraged him to remain in contact with cancer genetics on an annual basis so we can update his personal and family histories and let him know of advances in cancer genetics that may benefit this family.   Our contact number was provided. Robert Hatfield questions were answered to his satisfaction, and he knows he is welcome to call us at anytime with additional questions or concerns.     Robert Hatfield M. Joette Catching, [place], [facility] Genetic Counselor [email or web address] (P) [phone]

## 2021-01-01 ENCOUNTER — Other Ambulatory Visit (HOSPITAL_COMMUNITY): Payer: Self-pay

## 2021-01-01 ENCOUNTER — Other Ambulatory Visit: Payer: Self-pay

## 2021-01-01 ENCOUNTER — Ambulatory Visit
Admission: RE | Admit: 2021-01-01 | Discharge: 2021-01-01 | Disposition: A | Discharge status: Home / Self Care | Payer: BC Managed Care – PPO | Source: Ambulatory Visit | Attending: Radiation Oncology | Admitting: Radiation Oncology

## 2021-01-01 DIAGNOSIS — Z510 Encounter for antineoplastic radiation therapy: Secondary | ICD-10-CM | POA: Diagnosis not present

## 2021-01-01 DIAGNOSIS — C20 Malignant neoplasm of rectum: Secondary | ICD-10-CM | POA: Insufficient documentation

## 2021-01-01 MED ORDER — CAPECITABINE 500 MG PO TABS: ORAL_TABLET | ORAL | 0 refills | Status: DC

## 2021-01-03 ENCOUNTER — Other Ambulatory Visit (HOSPITAL_COMMUNITY): Payer: Self-pay

## 2021-01-03 MED ORDER — CAPECITABINE 500 MG PO TABS
ORAL_TABLET | ORAL | 0 refills | Status: DC
  Filled 2021-01-03: qty 140, 28d supply, fill #0
  Filled 2021-01-29: qty 56, 8d supply, fill #1

## 2021-01-03 NOTE — Telephone Encounter (Signed)
Oral Chemotherapy Pharmacist Encounter  Robert Hatfield will pick up his medication from [facility] this afternoon 01/03/21. He knows to get started along with his first day of radiation on 01/08/21.  Patient Education I spoke with patient for overview of new oral chemotherapy medication: Xeloda (capecitabine) for the neoadjuvant treatment of stage II rectal cancer and descending colon cancer in conjunction with XRT, planned duration until the end of XRT. Planned start 01/08/21.  Pt is doing well. Counseled patient on administration, dosing, side effects, monitoring, drug-food interactions, safe handling, storage, and disposal. Patient will take 4 tablets (2000 mg) by mouth in the AM and 3 tablets (1500 mg) in the PM. Take with food. Take Monday through Friday. Take only on days of radiation.  Side effects include but not limited to: diarrhea, hand-foot syndrome, mouth sores, edema, decreased wbc, fatigue, N/V.   Hand-foot syndrome: recommended Robert Hatfield order and use Udderly Smooth Extra Care 20 Diarrhea: Pt plans on picking up loperamide to have on hand Mouth Sores: Pt will call if mouth sore occur to obtain magic mouthwash  Reviewed with patient importance of keeping a medication schedule and plan for any missed doses.  After discussion with patient no patient barriers to medication adherence identified.   Robert Hatfield voiced understanding and appreciation. All questions answered. Medication handout provided.  Provided patient with [facility] phone number. Patient knows to call the office with questions or concerns. Oral Chemotherapy Navigation Clinic will continue to follow.  Darl Pikes, PharmD, BCPS, BCOP, CPP Hematology/Oncology Clinical Pharmacist Practitioner [facility] [phone]  01/03/2021 9:43 AM

## 2021-01-03 NOTE — Telephone Encounter (Signed)
Oral Oncology Patient Advocate Encounter  Prior Authorization for Xeloda has been approved.    PA# [id number] Effective dates: 12/29/20 through 12/28/21  Patients co-pay is $0.00  Oral Oncology Clinic will continue to follow.   [place] Patient [place] Phone [phone] Fax [phone] 01/03/2021 8:21 AM

## 2021-01-04 DIAGNOSIS — C20 Malignant neoplasm of rectum: Secondary | ICD-10-CM | POA: Diagnosis not present

## 2021-01-04 DIAGNOSIS — Z510 Encounter for antineoplastic radiation therapy: Secondary | ICD-10-CM | POA: Diagnosis not present

## 2021-01-08 ENCOUNTER — Ambulatory Visit
Admission: RE | Admit: 2021-01-08 | Discharge: 2021-01-08 | Disposition: A | Discharge status: Home / Self Care | Payer: BC Managed Care – PPO | Source: Ambulatory Visit | Attending: Radiation Oncology | Admitting: Radiation Oncology

## 2021-01-08 ENCOUNTER — Other Ambulatory Visit: Payer: Self-pay

## 2021-01-08 DIAGNOSIS — Z510 Encounter for antineoplastic radiation therapy: Secondary | ICD-10-CM | POA: Diagnosis not present

## 2021-01-08 DIAGNOSIS — C20 Malignant neoplasm of rectum: Secondary | ICD-10-CM | POA: Diagnosis not present

## 2021-01-09 ENCOUNTER — Ambulatory Visit
Admission: RE | Admit: 2021-01-09 | Discharge: 2021-01-09 | Disposition: A | Discharge status: Home / Self Care | Payer: BC Managed Care – PPO | Source: Ambulatory Visit | Attending: Radiation Oncology | Admitting: Radiation Oncology

## 2021-01-09 DIAGNOSIS — C20 Malignant neoplasm of rectum: Secondary | ICD-10-CM | POA: Diagnosis not present

## 2021-01-09 DIAGNOSIS — Z510 Encounter for antineoplastic radiation therapy: Secondary | ICD-10-CM | POA: Diagnosis not present

## 2021-01-10 ENCOUNTER — Other Ambulatory Visit: Payer: Self-pay

## 2021-01-10 ENCOUNTER — Ambulatory Visit
Admission: RE | Admit: 2021-01-10 | Discharge: 2021-01-10 | Disposition: A | Discharge status: Home / Self Care | Payer: BC Managed Care – PPO | Source: Ambulatory Visit | Attending: Radiation Oncology | Admitting: Radiation Oncology

## 2021-01-10 DIAGNOSIS — Z510 Encounter for antineoplastic radiation therapy: Secondary | ICD-10-CM | POA: Diagnosis not present

## 2021-01-10 DIAGNOSIS — C20 Malignant neoplasm of rectum: Secondary | ICD-10-CM | POA: Diagnosis not present

## 2021-01-11 ENCOUNTER — Ambulatory Visit
Admission: RE | Admit: 2021-01-11 | Discharge: 2021-01-11 | Disposition: A | Discharge status: Home / Self Care | Payer: BC Managed Care – PPO | Source: Ambulatory Visit | Attending: Radiation Oncology | Admitting: Radiation Oncology

## 2021-01-11 DIAGNOSIS — Z510 Encounter for antineoplastic radiation therapy: Secondary | ICD-10-CM | POA: Diagnosis not present

## 2021-01-11 DIAGNOSIS — C20 Malignant neoplasm of rectum: Secondary | ICD-10-CM | POA: Diagnosis not present

## 2021-01-12 ENCOUNTER — Other Ambulatory Visit: Payer: Self-pay

## 2021-01-12 ENCOUNTER — Ambulatory Visit
Admission: RE | Admit: 2021-01-12 | Discharge: 2021-01-12 | Disposition: A | Discharge status: Home / Self Care | Payer: BC Managed Care – PPO | Source: Ambulatory Visit | Attending: Radiation Oncology | Admitting: Radiation Oncology

## 2021-01-12 DIAGNOSIS — Z510 Encounter for antineoplastic radiation therapy: Secondary | ICD-10-CM | POA: Diagnosis not present

## 2021-01-12 DIAGNOSIS — C20 Malignant neoplasm of rectum: Secondary | ICD-10-CM | POA: Diagnosis not present

## 2021-01-12 NOTE — Progress Notes (Addendum)
Pt here for patient teaching.    Pt given Radiation and You booklet.    Reviewed areas of pertinence such as diarrhea, fatigue, hair loss, nausea and vomiting, sexual and fertility changes, skin changes, and urinary and bladder changes .   Pt able to give teach back of to pat skin, use unscented/gentle soap, use baby wipes, have Imodium on hand, drink plenty of water, and sitz bath,avoid applying anything to skin within 4 hours of treatment.   Pt verbalizes understanding of information given and will contact nursing with any questions or concerns.    [email or web address]

## 2021-01-15 ENCOUNTER — Ambulatory Visit
Admission: RE | Admit: 2021-01-15 | Discharge: 2021-01-15 | Disposition: A | Discharge status: Home / Self Care | Payer: BC Managed Care – PPO | Source: Ambulatory Visit | Attending: Radiation Oncology | Admitting: Radiation Oncology

## 2021-01-15 ENCOUNTER — Other Ambulatory Visit: Payer: BC Managed Care – PPO

## 2021-01-15 ENCOUNTER — Other Ambulatory Visit: Payer: Self-pay

## 2021-01-15 DIAGNOSIS — Z510 Encounter for antineoplastic radiation therapy: Secondary | ICD-10-CM | POA: Diagnosis not present

## 2021-01-15 DIAGNOSIS — C20 Malignant neoplasm of rectum: Secondary | ICD-10-CM | POA: Diagnosis not present

## 2021-01-16 ENCOUNTER — Ambulatory Visit
Admission: RE | Admit: 2021-01-16 | Discharge: 2021-01-16 | Disposition: A | Discharge status: Home / Self Care | Payer: BC Managed Care – PPO | Source: Ambulatory Visit | Attending: Radiation Oncology | Admitting: Radiation Oncology

## 2021-01-16 DIAGNOSIS — Z510 Encounter for antineoplastic radiation therapy: Secondary | ICD-10-CM | POA: Diagnosis not present

## 2021-01-16 DIAGNOSIS — C20 Malignant neoplasm of rectum: Secondary | ICD-10-CM | POA: Diagnosis not present

## 2021-01-17 ENCOUNTER — Other Ambulatory Visit: Payer: Self-pay

## 2021-01-17 ENCOUNTER — Ambulatory Visit
Admission: RE | Admit: 2021-01-17 | Discharge: 2021-01-17 | Disposition: A | Discharge status: Home / Self Care | Payer: BC Managed Care – PPO | Source: Ambulatory Visit | Attending: Radiation Oncology | Admitting: Radiation Oncology

## 2021-01-17 DIAGNOSIS — Z510 Encounter for antineoplastic radiation therapy: Secondary | ICD-10-CM | POA: Diagnosis not present

## 2021-01-17 DIAGNOSIS — C20 Malignant neoplasm of rectum: Secondary | ICD-10-CM | POA: Diagnosis not present

## 2021-01-18 ENCOUNTER — Ambulatory Visit
Admission: RE | Admit: 2021-01-18 | Discharge: 2021-01-18 | Disposition: A | Discharge status: Home / Self Care | Payer: BC Managed Care – PPO | Source: Ambulatory Visit | Attending: Radiation Oncology | Admitting: Radiation Oncology

## 2021-01-18 DIAGNOSIS — Z510 Encounter for antineoplastic radiation therapy: Secondary | ICD-10-CM | POA: Insufficient documentation

## 2021-01-18 DIAGNOSIS — C20 Malignant neoplasm of rectum: Secondary | ICD-10-CM | POA: Diagnosis not present

## 2021-01-19 ENCOUNTER — Other Ambulatory Visit: Payer: Self-pay | Admitting: Family Medicine

## 2021-01-19 ENCOUNTER — Ambulatory Visit
Admission: RE | Admit: 2021-01-19 | Discharge: 2021-01-19 | Disposition: A | Discharge status: Home / Self Care | Payer: BC Managed Care – PPO | Source: Ambulatory Visit | Attending: Radiation Oncology | Admitting: Radiation Oncology

## 2021-01-19 DIAGNOSIS — F411 Generalized anxiety disorder: Secondary | ICD-10-CM

## 2021-01-19 DIAGNOSIS — C20 Malignant neoplasm of rectum: Secondary | ICD-10-CM | POA: Diagnosis not present

## 2021-01-19 DIAGNOSIS — Z510 Encounter for antineoplastic radiation therapy: Secondary | ICD-10-CM | POA: Diagnosis not present

## 2021-01-19 NOTE — Telephone Encounter (Signed)
Called patient and he did request this refill.

## 2021-01-23 ENCOUNTER — Other Ambulatory Visit: Payer: Self-pay

## 2021-01-23 ENCOUNTER — Ambulatory Visit
Admission: RE | Admit: 2021-01-23 | Discharge: 2021-01-23 | Disposition: A | Discharge status: Home / Self Care | Payer: BC Managed Care – PPO | Source: Ambulatory Visit | Attending: Radiation Oncology | Admitting: Radiation Oncology

## 2021-01-23 DIAGNOSIS — Z510 Encounter for antineoplastic radiation therapy: Secondary | ICD-10-CM | POA: Diagnosis not present

## 2021-01-23 DIAGNOSIS — C20 Malignant neoplasm of rectum: Secondary | ICD-10-CM | POA: Diagnosis not present

## 2021-01-24 ENCOUNTER — Inpatient Hospital Stay: Payer: BC Managed Care – PPO | Attending: Oncology

## 2021-01-24 ENCOUNTER — Ambulatory Visit
Admission: RE | Admit: 2021-01-24 | Discharge: 2021-01-24 | Disposition: A | Discharge status: Home / Self Care | Payer: BC Managed Care – PPO | Source: Ambulatory Visit | Attending: Radiation Oncology | Admitting: Radiation Oncology

## 2021-01-24 ENCOUNTER — Inpatient Hospital Stay (HOSPITAL_BASED_OUTPATIENT_CLINIC_OR_DEPARTMENT_OTHER): Payer: BC Managed Care – PPO | Admitting: Oncology

## 2021-01-24 VITALS — BP 113/73 | HR 72 | Temp 97.8°F | Resp 18 | Ht 73.0 in | Wt 184.9 lb

## 2021-01-24 DIAGNOSIS — R5381 Other malaise: Secondary | ICD-10-CM | POA: Diagnosis not present

## 2021-01-24 DIAGNOSIS — J449 Chronic obstructive pulmonary disease, unspecified: Secondary | ICD-10-CM | POA: Insufficient documentation

## 2021-01-24 DIAGNOSIS — Z79899 Other long term (current) drug therapy: Secondary | ICD-10-CM | POA: Insufficient documentation

## 2021-01-24 DIAGNOSIS — Z8371 Family history of colonic polyps: Secondary | ICD-10-CM | POA: Insufficient documentation

## 2021-01-24 DIAGNOSIS — Z87891 Personal history of nicotine dependence: Secondary | ICD-10-CM | POA: Insufficient documentation

## 2021-01-24 DIAGNOSIS — R197 Diarrhea, unspecified: Secondary | ICD-10-CM | POA: Insufficient documentation

## 2021-01-24 DIAGNOSIS — M5090 Cervical disc disorder, unspecified, unspecified cervical region: Secondary | ICD-10-CM | POA: Insufficient documentation

## 2021-01-24 DIAGNOSIS — C20 Malignant neoplasm of rectum: Secondary | ICD-10-CM | POA: Insufficient documentation

## 2021-01-24 DIAGNOSIS — M25512 Pain in left shoulder: Secondary | ICD-10-CM | POA: Insufficient documentation

## 2021-01-24 DIAGNOSIS — M79602 Pain in left arm: Secondary | ICD-10-CM | POA: Insufficient documentation

## 2021-01-24 DIAGNOSIS — C189 Malignant neoplasm of colon, unspecified: Secondary | ICD-10-CM

## 2021-01-24 DIAGNOSIS — Z510 Encounter for antineoplastic radiation therapy: Secondary | ICD-10-CM | POA: Diagnosis not present

## 2021-01-24 DIAGNOSIS — E538 Deficiency of other specified B group vitamins: Secondary | ICD-10-CM | POA: Insufficient documentation

## 2021-01-24 LAB — CMP (CANCER CENTER ONLY)
ALT: 17 U/L (ref 0–44)
AST: 18 U/L (ref 15–41)
Albumin: 4.1 g/dL (ref 3.5–5.0)
Alkaline Phosphatase: 45 U/L (ref 38–126)
Anion gap: 7 (ref 5–15)
BUN: 14 mg/dL (ref 6–20)
CO2: 28 mmol/L (ref 22–32)
Calcium: 9.4 mg/dL (ref 8.9–10.3)
Chloride: 105 mmol/L (ref 98–111)
Creatinine: 0.8 mg/dL (ref 0.61–1.24)
GFR, Estimated: >60 mL/min (ref >60)
Glucose, Bld: 107 mg/dL — ABNORMAL HIGH (ref 70–99)
Potassium: 4.5 mmol/L (ref 3.5–5.1)
Sodium: 140 mmol/L (ref 135–145)
Total Bilirubin: 0.5 mg/dL (ref 0.3–1.2)
Total Protein: 6.7 g/dL (ref 6.5–8.1)

## 2021-01-24 LAB — CBC WITH DIFFERENTIAL (CANCER CENTER ONLY)
Abs Immature Granulocytes: 0.02 10*3/uL (ref 0.00–0.07)
Basophils Absolute: 0 10*3/uL (ref 0.0–0.1)
Basophils Relative: 1 %
Eosinophils Absolute: 0.2 10*3/uL (ref 0.0–0.5)
Eosinophils Relative: 6 %
HCT: 42.6 % (ref 39.0–52.0)
Hemoglobin: 14.4 g/dL (ref 13.0–17.0)
Immature Granulocytes: 1 %
Lymphocytes Relative: 22 %
Lymphs Abs: 0.8 10*3/uL (ref 0.7–4.0)
MCH: 31.1 pg (ref 26.0–34.0)
MCHC: 33.8 g/dL (ref 30.0–36.0)
MCV: 92 fL (ref 80.0–100.0)
Monocytes Absolute: 0.4 10*3/uL (ref 0.1–1.0)
Monocytes Relative: 11 %
Neutro Abs: 2.1 10*3/uL (ref 1.7–7.7)
Neutrophils Relative %: 59 %
Platelet Count: 188 10*3/uL (ref 150–400)
RBC: 4.63 MIL/uL (ref 4.22–5.81)
RDW: 14.4 % (ref 11.5–15.5)
WBC Count: 3.5 10*3/uL — ABNORMAL LOW (ref 4.0–10.5)
nRBC: 0 % (ref 0.0–0.2)

## 2021-01-24 LAB — CEA (ACCESS): CEA (CHCC): 3 ng/mL (ref 0.00–5.00)

## 2021-01-24 NOTE — Progress Notes (Signed)
  Robert Hatfield   Diagnosis: Rectal cancer  INTERVAL HISTORY:   Robert Hatfield began concurrent capecitabine and radiation on 01/08/2021.  No nausea, mouth sores, or hand/foot pain.  He developed mild diarrhea beginning last weekend.  The diarrhea has improved with Imodium.  He has up to 3 loose stools per day.  No bleeding.  Objective:  Vital signs in last 24 hours:  Blood pressure 113/73, pulse 72, temperature 97.8 F (36.6 C), resp. rate 18, height $RemoveBe'6\' 1"'WwMQoazdO$  (1.854 m), weight 184 lb 14.4 oz (83.9 kg), SpO2 100 %.    HEENT: No thrush or ulcers Resp: Lungs clear bilaterally Cardio: Regular rate and rhythm GI: No hepatosplenomegaly, mild diffuse tenderness Vascular: No leg edema  Skin: Palms without erythema, dryness at the soles  Lab Results:  Lab Results  Component Value Date   WBC 3.5 (L) 01/24/2021   HGB 14.4 01/24/2021   HCT 42.6 01/24/2021   MCV 92.0 01/24/2021   PLT 188 01/24/2021   NEUTROABS 2.1 01/24/2021    CMP  Lab Results  Component Value Date   NA 138 12/27/2020   K 4.0 12/27/2020   CL 104 12/27/2020   CO2 27 12/27/2020   GLUCOSE 96 12/27/2020   BUN 13 12/27/2020   CREATININE 0.68 12/27/2020   CALCIUM 9.0 12/27/2020   PROT 7.1 12/27/2020   ALBUMIN 4.2 12/27/2020   AST 15 12/27/2020   ALT 13 12/27/2020   ALKPHOS 51 12/27/2020   BILITOT 0.6 12/27/2020   GFRNONAA >60 12/27/2020   GFRAA 119 11/17/2019    Lab Results  Component Value Date   CEA 1.9 11/22/2020     Medications: I have reviewed the patient's current medications.   Assessment/Plan: Colon cancer Biopsy of a descending colon mass at 40 cm revealed adenocarcinoma, normal mismatch repair protein expression Rectal mass Circumferential mass beginning at 4 cm from the anal verge-biopsy tubulovillous adenoma with high-grade dysplasia CT 11/27/2020-indeterminate 7 mm segment 4 liver lesion, no other evidence of metastatic disease MRI pelvis 12/10/2020-tumor at  12.1 cm from the anal verge, 6.4 cm from the internal anal sphincter, T3b versus T3c, N0 Sigmoidoscopy-biopsy of rectal mass 12/22/2020-distal edge biopsy, tubulovillous adenoma; proximal edge biopsy focal invasive adenocarcinoma arising in a tubulovillous adenoma with high-grade dysplasia, lymphovascular invasion present  Xeloda/radiation 01/08/2021   3.  COPD 4.  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  Family history of colon polyps 6.  Multiple colon polyps on the colonoscopy 11/16/2020-tubular adenomas 7.  Remote history of tobacco use 8.  Remote history of heavy alcohol use 9.  Vitamin B12 deficiency       Disposition: Mr. Fogel appears stable.  He is tolerating the Xeloda and radiation well.  He will call for increased diarrhea or new symptoms.  He will return for an office and lab visit in 2 weeks.  Betsy Coder, MD  01/24/2021  8:54 AM

## 2021-01-25 ENCOUNTER — Other Ambulatory Visit: Payer: Self-pay

## 2021-01-25 ENCOUNTER — Ambulatory Visit
Admission: RE | Admit: 2021-01-25 | Discharge: 2021-01-25 | Disposition: A | Discharge status: Home / Self Care | Payer: BC Managed Care – PPO | Source: Ambulatory Visit | Attending: Radiation Oncology | Admitting: Radiation Oncology

## 2021-01-25 DIAGNOSIS — Z510 Encounter for antineoplastic radiation therapy: Secondary | ICD-10-CM | POA: Diagnosis not present

## 2021-01-25 DIAGNOSIS — C20 Malignant neoplasm of rectum: Secondary | ICD-10-CM | POA: Diagnosis not present

## 2021-01-26 ENCOUNTER — Ambulatory Visit
Admission: RE | Admit: 2021-01-26 | Discharge: 2021-01-26 | Disposition: A | Discharge status: Home / Self Care | Payer: BC Managed Care – PPO | Source: Ambulatory Visit | Attending: Radiation Oncology | Admitting: Radiation Oncology

## 2021-01-26 DIAGNOSIS — Z510 Encounter for antineoplastic radiation therapy: Secondary | ICD-10-CM | POA: Diagnosis not present

## 2021-01-26 DIAGNOSIS — C20 Malignant neoplasm of rectum: Secondary | ICD-10-CM | POA: Diagnosis not present

## 2021-01-29 ENCOUNTER — Other Ambulatory Visit: Payer: Self-pay

## 2021-01-29 ENCOUNTER — Other Ambulatory Visit (HOSPITAL_COMMUNITY): Payer: Self-pay

## 2021-01-29 ENCOUNTER — Ambulatory Visit
Admission: RE | Admit: 2021-01-29 | Discharge: 2021-01-29 | Disposition: A | Discharge status: Home / Self Care | Payer: BC Managed Care – PPO | Source: Ambulatory Visit | Attending: Radiation Oncology | Admitting: Radiation Oncology

## 2021-01-29 DIAGNOSIS — C20 Malignant neoplasm of rectum: Secondary | ICD-10-CM | POA: Diagnosis not present

## 2021-01-29 DIAGNOSIS — Z510 Encounter for antineoplastic radiation therapy: Secondary | ICD-10-CM | POA: Diagnosis not present

## 2021-01-30 ENCOUNTER — Ambulatory Visit
Admission: RE | Admit: 2021-01-30 | Discharge: 2021-01-30 | Disposition: A | Discharge status: Home / Self Care | Payer: BC Managed Care – PPO | Source: Ambulatory Visit | Attending: Radiation Oncology | Admitting: Radiation Oncology

## 2021-01-30 DIAGNOSIS — C20 Malignant neoplasm of rectum: Secondary | ICD-10-CM | POA: Diagnosis not present

## 2021-01-30 DIAGNOSIS — Z510 Encounter for antineoplastic radiation therapy: Secondary | ICD-10-CM | POA: Diagnosis not present

## 2021-01-31 ENCOUNTER — Other Ambulatory Visit (HOSPITAL_COMMUNITY): Payer: Self-pay

## 2021-01-31 ENCOUNTER — Ambulatory Visit
Admission: RE | Admit: 2021-01-31 | Discharge: 2021-01-31 | Disposition: A | Discharge status: Home / Self Care | Payer: BC Managed Care – PPO | Source: Ambulatory Visit | Attending: Radiation Oncology | Admitting: Radiation Oncology

## 2021-01-31 ENCOUNTER — Other Ambulatory Visit: Payer: Self-pay

## 2021-01-31 DIAGNOSIS — L918 Other hypertrophic disorders of the skin: Secondary | ICD-10-CM | POA: Diagnosis not present

## 2021-01-31 DIAGNOSIS — C20 Malignant neoplasm of rectum: Secondary | ICD-10-CM | POA: Diagnosis not present

## 2021-01-31 DIAGNOSIS — Z510 Encounter for antineoplastic radiation therapy: Secondary | ICD-10-CM | POA: Diagnosis not present

## 2021-02-01 ENCOUNTER — Ambulatory Visit
Admission: RE | Admit: 2021-02-01 | Discharge: 2021-02-01 | Disposition: A | Discharge status: Home / Self Care | Payer: BC Managed Care – PPO | Source: Ambulatory Visit | Attending: Radiation Oncology | Admitting: Radiation Oncology

## 2021-02-01 DIAGNOSIS — Z510 Encounter for antineoplastic radiation therapy: Secondary | ICD-10-CM | POA: Diagnosis not present

## 2021-02-01 DIAGNOSIS — C20 Malignant neoplasm of rectum: Secondary | ICD-10-CM | POA: Diagnosis not present

## 2021-02-02 ENCOUNTER — Ambulatory Visit
Admission: RE | Admit: 2021-02-02 | Discharge: 2021-02-02 | Disposition: A | Discharge status: Home / Self Care | Payer: BC Managed Care – PPO | Source: Ambulatory Visit | Attending: Radiation Oncology | Admitting: Radiation Oncology

## 2021-02-02 ENCOUNTER — Other Ambulatory Visit: Payer: Self-pay

## 2021-02-02 DIAGNOSIS — C20 Malignant neoplasm of rectum: Secondary | ICD-10-CM | POA: Diagnosis not present

## 2021-02-02 DIAGNOSIS — Z510 Encounter for antineoplastic radiation therapy: Secondary | ICD-10-CM | POA: Diagnosis not present

## 2021-02-05 ENCOUNTER — Other Ambulatory Visit: Payer: Self-pay

## 2021-02-05 ENCOUNTER — Ambulatory Visit
Admission: RE | Admit: 2021-02-05 | Discharge: 2021-02-05 | Disposition: A | Discharge status: Home / Self Care | Payer: BC Managed Care – PPO | Source: Ambulatory Visit | Attending: Radiation Oncology | Admitting: Radiation Oncology

## 2021-02-05 DIAGNOSIS — C20 Malignant neoplasm of rectum: Secondary | ICD-10-CM | POA: Diagnosis not present

## 2021-02-05 DIAGNOSIS — Z510 Encounter for antineoplastic radiation therapy: Secondary | ICD-10-CM | POA: Diagnosis not present

## 2021-02-06 ENCOUNTER — Ambulatory Visit
Admission: RE | Admit: 2021-02-06 | Discharge: 2021-02-06 | Disposition: A | Discharge status: Home / Self Care | Payer: BC Managed Care – PPO | Source: Ambulatory Visit | Attending: Radiation Oncology | Admitting: Radiation Oncology

## 2021-02-06 ENCOUNTER — Inpatient Hospital Stay: Payer: BC Managed Care – PPO

## 2021-02-06 ENCOUNTER — Inpatient Hospital Stay: Payer: BC Managed Care – PPO | Admitting: Oncology

## 2021-02-06 DIAGNOSIS — Z510 Encounter for antineoplastic radiation therapy: Secondary | ICD-10-CM | POA: Diagnosis not present

## 2021-02-06 DIAGNOSIS — C20 Malignant neoplasm of rectum: Secondary | ICD-10-CM | POA: Diagnosis not present

## 2021-02-07 ENCOUNTER — Other Ambulatory Visit: Payer: Self-pay

## 2021-02-07 ENCOUNTER — Ambulatory Visit
Admission: RE | Admit: 2021-02-07 | Discharge: 2021-02-07 | Disposition: A | Discharge status: Home / Self Care | Payer: BC Managed Care – PPO | Source: Ambulatory Visit | Attending: Radiation Oncology | Admitting: Radiation Oncology

## 2021-02-07 DIAGNOSIS — Z510 Encounter for antineoplastic radiation therapy: Secondary | ICD-10-CM | POA: Diagnosis not present

## 2021-02-07 DIAGNOSIS — C20 Malignant neoplasm of rectum: Secondary | ICD-10-CM | POA: Diagnosis not present

## 2021-02-08 ENCOUNTER — Inpatient Hospital Stay: Payer: BC Managed Care – PPO

## 2021-02-08 ENCOUNTER — Inpatient Hospital Stay (HOSPITAL_BASED_OUTPATIENT_CLINIC_OR_DEPARTMENT_OTHER): Payer: BC Managed Care – PPO | Admitting: Oncology

## 2021-02-08 ENCOUNTER — Ambulatory Visit
Admission: RE | Admit: 2021-02-08 | Discharge: 2021-02-08 | Disposition: A | Discharge status: Home / Self Care | Payer: BC Managed Care – PPO | Source: Ambulatory Visit | Attending: Radiation Oncology | Admitting: Radiation Oncology

## 2021-02-08 VITALS — BP 104/69 | HR 61 | Temp 97.7°F | Resp 18 | Ht 73.0 in | Wt 182.8 lb

## 2021-02-08 DIAGNOSIS — Z87891 Personal history of nicotine dependence: Secondary | ICD-10-CM | POA: Diagnosis not present

## 2021-02-08 DIAGNOSIS — C20 Malignant neoplasm of rectum: Secondary | ICD-10-CM | POA: Diagnosis not present

## 2021-02-08 DIAGNOSIS — M5090 Cervical disc disorder, unspecified, unspecified cervical region: Secondary | ICD-10-CM | POA: Diagnosis not present

## 2021-02-08 DIAGNOSIS — Z510 Encounter for antineoplastic radiation therapy: Secondary | ICD-10-CM | POA: Diagnosis not present

## 2021-02-08 DIAGNOSIS — M25512 Pain in left shoulder: Secondary | ICD-10-CM | POA: Diagnosis not present

## 2021-02-08 DIAGNOSIS — Z8371 Family history of colonic polyps: Secondary | ICD-10-CM | POA: Diagnosis not present

## 2021-02-08 DIAGNOSIS — M79602 Pain in left arm: Secondary | ICD-10-CM | POA: Diagnosis not present

## 2021-02-08 DIAGNOSIS — J449 Chronic obstructive pulmonary disease, unspecified: Secondary | ICD-10-CM | POA: Diagnosis not present

## 2021-02-08 DIAGNOSIS — R197 Diarrhea, unspecified: Secondary | ICD-10-CM | POA: Diagnosis not present

## 2021-02-08 DIAGNOSIS — R5381 Other malaise: Secondary | ICD-10-CM | POA: Diagnosis not present

## 2021-02-08 DIAGNOSIS — Z79899 Other long term (current) drug therapy: Secondary | ICD-10-CM | POA: Diagnosis not present

## 2021-02-08 DIAGNOSIS — E538 Deficiency of other specified B group vitamins: Secondary | ICD-10-CM | POA: Diagnosis not present

## 2021-02-08 LAB — CBC WITH DIFFERENTIAL (CANCER CENTER ONLY)
Abs Immature Granulocytes: 0.03 10*3/uL (ref 0.00–0.07)
Basophils Absolute: 0 10*3/uL (ref 0.0–0.1)
Basophils Relative: 1 %
Eosinophils Absolute: 0.5 10*3/uL (ref 0.0–0.5)
Eosinophils Relative: 14 %
HCT: 41.1 % (ref 39.0–52.0)
Hemoglobin: 14 g/dL (ref 13.0–17.0)
Immature Granulocytes: 1 %
Lymphocytes Relative: 14 %
Lymphs Abs: 0.5 10*3/uL — ABNORMAL LOW (ref 0.7–4.0)
MCH: 31.3 pg (ref 26.0–34.0)
MCHC: 34.1 g/dL (ref 30.0–36.0)
MCV: 91.9 fL (ref 80.0–100.0)
Monocytes Absolute: 0.5 10*3/uL (ref 0.1–1.0)
Monocytes Relative: 13 %
Neutro Abs: 2.3 10*3/uL (ref 1.7–7.7)
Neutrophils Relative %: 57 %
Platelet Count: 230 10*3/uL (ref 150–400)
RBC: 4.47 MIL/uL (ref 4.22–5.81)
RDW: 15.8 % — ABNORMAL HIGH (ref 11.5–15.5)
WBC Count: 4 10*3/uL (ref 4.0–10.5)
nRBC: 0 % (ref 0.0–0.2)

## 2021-02-08 LAB — CMP (CANCER CENTER ONLY)
ALT: 27 U/L (ref 0–44)
AST: 22 U/L (ref 15–41)
Albumin: 4.1 g/dL (ref 3.5–5.0)
Alkaline Phosphatase: 44 U/L (ref 38–126)
Anion gap: 7 (ref 5–15)
BUN: 17 mg/dL (ref 6–20)
CO2: 28 mmol/L (ref 22–32)
Calcium: 9.5 mg/dL (ref 8.9–10.3)
Chloride: 105 mmol/L (ref 98–111)
Creatinine: 0.85 mg/dL (ref 0.61–1.24)
GFR, Estimated: >60 mL/min (ref >60)
Glucose, Bld: 92 mg/dL (ref 70–99)
Potassium: 4.2 mmol/L (ref 3.5–5.1)
Sodium: 140 mmol/L (ref 135–145)
Total Bilirubin: 0.6 mg/dL (ref 0.3–1.2)
Total Protein: 6.4 g/dL — ABNORMAL LOW (ref 6.5–8.1)

## 2021-02-08 NOTE — Progress Notes (Signed)
  Robert Hatfield OFFICE PROGRESS NOTE   Diagnosis: Rectal cancer  INTERVAL HISTORY:   Robert Hatfield returns as scheduled.  He continues capecitabine and radiation.  No mouth sores, nausea, hand/foot pain, or diarrhea.  He reports malaise.  Objective:  Vital signs in last 24 hours:  Blood pressure 104/69, pulse 61, temperature 97.7 F (36.5 C), temperature source Oral, resp. rate 18, height _0  (1.854 m), weight 182 lb 12.8 oz (82.9 kg), SpO2 100 %.    HEENT: No thrush or ulcers Resp: Lungs clear bilaterally Cardio: Regular rate and rhythm GI: Nontender, no hepatosplenomegaly, no mass Vascular: No leg edema  Skin: Palms without erythema, hyperpigmentation and skin thickening at the soles   Lab Results:  Lab Results  Component Value Date   WBC 4.0 02/08/2021   HGB 14.0 02/08/2021   HCT 41.1 02/08/2021   MCV 91.9 02/08/2021   PLT 230 02/08/2021   NEUTROABS 2.3 02/08/2021    CMP  Lab Results  Component Value Date   NA 140 02/08/2021   K 4.2 02/08/2021   CL 105 02/08/2021   CO2 28 02/08/2021   GLUCOSE 92 02/08/2021   BUN 17 02/08/2021   CREATININE 0.85 02/08/2021   CALCIUM 9.5 02/08/2021   PROT 6.4 (L) 02/08/2021   ALBUMIN 4.1 02/08/2021   AST 22 02/08/2021   ALT 27 02/08/2021   ALKPHOS 44 02/08/2021   BILITOT 0.6 02/08/2021   GFRNONAA >60 02/08/2021   GFRAA 119 11/17/2019    Lab Results  Component Value Date   CEA 3.00 01/24/2021     Medications: I have reviewed the patient's current medications.   Assessment/Plan: Colon cancer Biopsy of a descending colon mass at 40 cm revealed adenocarcinoma, normal mismatch repair protein expression Rectal mass Circumferential mass beginning at 4 cm from the anal verge-biopsy tubulovillous adenoma with high-grade dysplasia CT 11/27/2020-indeterminate 7 mm segment 4 liver lesion, no other evidence of metastatic disease MRI pelvis 12/10/2020-tumor at 12.1 cm from the anal verge, 6.4 cm from the internal  anal sphincter, T3b versus T3c, N0 Sigmoidoscopy-biopsy of rectal mass 12/22/2020-distal edge biopsy, tubulovillous adenoma; proximal edge biopsy focal invasive adenocarcinoma arising in a tubulovillous adenoma with high-grade dysplasia, lymphovascular invasion present  Xeloda/radiation 01/08/2021-02/15/2021   3.  COPD 4.  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  Family history of colon polyps 6.  Multiple colon polyps on the colonoscopy 11/16/2020-tubular adenomas 7.  Remote history of tobacco use 8.  Remote history of heavy alcohol use 9.  Vitamin B12 deficiency         Disposition: Robert Hatfield appears stable.  He is tolerating the Xeloda and radiation well.  He will complete treatment next week.  He reports surgery is being scheduled for early December.  He will return for an office and lab visit in 6 weeks.  Betsy Coder, MD  02/08/2021  10:25 AM

## 2021-02-09 ENCOUNTER — Other Ambulatory Visit: Payer: Self-pay

## 2021-02-09 ENCOUNTER — Ambulatory Visit
Admission: RE | Admit: 2021-02-09 | Discharge: 2021-02-09 | Disposition: A | Discharge status: Home / Self Care | Payer: BC Managed Care – PPO | Source: Ambulatory Visit | Attending: Radiation Oncology | Admitting: Radiation Oncology

## 2021-02-09 DIAGNOSIS — Z510 Encounter for antineoplastic radiation therapy: Secondary | ICD-10-CM | POA: Diagnosis not present

## 2021-02-09 DIAGNOSIS — C20 Malignant neoplasm of rectum: Secondary | ICD-10-CM | POA: Diagnosis not present

## 2021-02-12 ENCOUNTER — Other Ambulatory Visit: Payer: Self-pay

## 2021-02-12 ENCOUNTER — Ambulatory Visit
Admission: RE | Admit: 2021-02-12 | Discharge: 2021-02-12 | Disposition: A | Discharge status: Home / Self Care | Payer: BC Managed Care – PPO | Source: Ambulatory Visit | Attending: Radiation Oncology | Admitting: Radiation Oncology

## 2021-02-12 DIAGNOSIS — C20 Malignant neoplasm of rectum: Secondary | ICD-10-CM | POA: Diagnosis not present

## 2021-02-12 DIAGNOSIS — Z510 Encounter for antineoplastic radiation therapy: Secondary | ICD-10-CM | POA: Diagnosis not present

## 2021-02-13 ENCOUNTER — Ambulatory Visit
Admission: RE | Admit: 2021-02-13 | Discharge: 2021-02-13 | Disposition: A | Discharge status: Home / Self Care | Payer: BC Managed Care – PPO | Source: Ambulatory Visit | Attending: Radiation Oncology | Admitting: Radiation Oncology

## 2021-02-13 DIAGNOSIS — Z510 Encounter for antineoplastic radiation therapy: Secondary | ICD-10-CM | POA: Diagnosis not present

## 2021-02-13 DIAGNOSIS — M5412 Radiculopathy, cervical region: Secondary | ICD-10-CM | POA: Diagnosis not present

## 2021-02-13 DIAGNOSIS — C20 Malignant neoplasm of rectum: Secondary | ICD-10-CM | POA: Diagnosis not present

## 2021-02-14 ENCOUNTER — Other Ambulatory Visit: Payer: Self-pay

## 2021-02-14 ENCOUNTER — Ambulatory Visit
Admission: RE | Admit: 2021-02-14 | Discharge: 2021-02-14 | Disposition: A | Discharge status: Home / Self Care | Payer: BC Managed Care – PPO | Source: Ambulatory Visit | Attending: Radiation Oncology | Admitting: Radiation Oncology

## 2021-02-14 DIAGNOSIS — C20 Malignant neoplasm of rectum: Secondary | ICD-10-CM | POA: Diagnosis not present

## 2021-02-14 DIAGNOSIS — Z510 Encounter for antineoplastic radiation therapy: Secondary | ICD-10-CM | POA: Diagnosis not present

## 2021-02-15 ENCOUNTER — Ambulatory Visit: Admission: RE | Admit: 2021-02-15 | Payer: BC Managed Care – PPO | Source: Ambulatory Visit

## 2021-02-15 ENCOUNTER — Ambulatory Visit: Payer: BC Managed Care – PPO

## 2021-02-15 ENCOUNTER — Encounter: Payer: Self-pay | Admitting: Radiation Oncology

## 2021-02-15 DIAGNOSIS — C20 Malignant neoplasm of rectum: Secondary | ICD-10-CM | POA: Diagnosis not present

## 2021-02-15 DIAGNOSIS — Z510 Encounter for antineoplastic radiation therapy: Secondary | ICD-10-CM | POA: Diagnosis not present

## 2021-02-19 NOTE — Progress Notes (Signed)
  Radiation Oncology         [phone] ________________________________  Name: Robert Hatfield MRN: [id number]  Date: 03/08/2021  DOB: 1971-03-29  SIMULATION NOTE   NARRATIVE:  The patient underwent simulation today for ongoing radiation therapy.  The existing CT study set was employed for the purpose of virtual treatment planning.  The target and avoidance structures were reviewed and modified as necessary.  Treatment planning then occurred.  The radiation boost prescription was entered and confirmed.  A total of 4 complex treatment devices were fabricated in the form of multi-leaf collimators to shape radiation around the targets while maximally excluding nearby normal structures. I have requested : Isodose Plan.    PLAN:  This modified radiation beam arrangement is intended to continue the current radiation dose to an additional 5.4 Gy in 3 fractions for a total cumulative dose of 50.4 Gy.    ------------------------------------------------  Jodelle Gross, MD, PhD

## 2021-02-22 ENCOUNTER — Other Ambulatory Visit: Payer: Self-pay | Admitting: Family Medicine

## 2021-02-22 DIAGNOSIS — F411 Generalized anxiety disorder: Secondary | ICD-10-CM

## 2021-02-22 NOTE — Telephone Encounter (Signed)
Is this okay to refill? 

## 2021-02-22 NOTE — Progress Notes (Signed)
                                                                                                                                                             Patient Name: Robert Hatfield MRN: [id number] DOB: [birth year] Referring Physician: Tomi Bamberger EVE (Profile Not Attached) Date of Service: 02/15/2021 [facility], [place]                                                        End Of Treatment Note  Diagnoses: C20-Malignant neoplasm of rectum  Cancer Staging: Stage IIA,cT3b-c,N0 adenocarcinoma of the rectum.  Intent: Curative  Radiation Treatment Dates: 01/08/2021 through 02/15/2021 Site Technique Total Dose (Gy) Dose per Fx (Gy) Completed Fx Beam Energies  Rectum: Rectum 3D 45/45 1.8 25/25 10X  Rectum: Rectum_Bst 3D 5.4/5.4 1.8 3/3 10X   Narrative: The patient tolerated radiation therapy relatively well. The patient developed fatigue, urinary   Plan: The patient will receive a call in about one month from the radiation oncology department. He will continue follow up with Dr. Benay Spice as well as Dr. Dema Severin in colorectal surgery.   ________________________________________________    Carola Rhine, PAC

## 2021-02-26 ENCOUNTER — Other Ambulatory Visit (HOSPITAL_COMMUNITY): Payer: Self-pay

## 2021-03-02 ENCOUNTER — Other Ambulatory Visit: Payer: BC Managed Care – PPO

## 2021-03-02 ENCOUNTER — Other Ambulatory Visit: Payer: Self-pay

## 2021-03-02 DIAGNOSIS — E538 Deficiency of other specified B group vitamins: Secondary | ICD-10-CM | POA: Diagnosis not present

## 2021-03-03 LAB — VITAMIN B12: Vitamin B-12: 510 pg/mL (ref 232–1245)

## 2021-03-08 ENCOUNTER — Encounter: Payer: Self-pay | Admitting: Radiation Oncology

## 2021-03-13 ENCOUNTER — Ambulatory Visit
Admission: RE | Admit: 2021-03-13 | Discharge: 2021-03-13 | Disposition: A | Discharge status: Home / Self Care | Payer: BC Managed Care – PPO | Source: Ambulatory Visit | Attending: Radiation Oncology | Admitting: Radiation Oncology

## 2021-03-13 DIAGNOSIS — C20 Malignant neoplasm of rectum: Secondary | ICD-10-CM | POA: Insufficient documentation

## 2021-03-16 NOTE — Progress Notes (Signed)
  Radiation Oncology         [phone] ________________________________  Name: Robert Hatfield MRN: [id number]  Date of Service: 03/13/2021  DOB: 06/10/1970  Post Treatment Telephone Note  Diagnosis:   Stage IIA,cT3b-c,N0 adenocarcinoma of the rectum.  Interval Since Last Radiation:  4 weeks    01/08/2021 through 02/15/2021 Site Technique Total Dose (Gy) Dose per Fx (Gy) Completed Fx Beam Energies  Rectum: Rectum 3D 45/45 1.8 25/25 10X  Rectum: Rectum_Bst 3D 5.4/5.4 1.8 3/3 10X    Narrative:  The patient was contacted today for routine follow-up. During treatment he did very well with radiotherapy and did not have significant desquamation. He reports he is doing well and has normal bowel movements and improvement of his fatigue which really set in just after completing radiation but improved after about 10 days post therapy. He met with Dr. Dema Severin and is waiting to schedule his surgery.   Impression/Plan: 1. Stage IIA,cT3b-c,N0 adenocarcinoma of the rectum.. The patient has been doing well since completion of radiotherapy. We discussed that we would be happy to continue to follow him as needed, but he will also continue to follow up with Dr. Benay Spice in medical oncology and Dr. Dema Severin as well.      Carola Rhine, PAC

## 2021-03-22 NOTE — Progress Notes (Signed)
  [place] OFFICE PROGRESS NOTE   Diagnosis: Rectal cancer  INTERVAL HISTORY:   Robert Hatfield returns as scheduled.  He reports malaise for approximately 10 days following the completion of radiation.  This has resolved.  He has rectal urgency.  No bleeding.  He is scheduled for rectal surgery on 05/03/2021.  Objective:  Vital signs in last 24 hours:  Blood pressure 122/89, pulse 62, temperature 98.4 F (36.9 C), temperature source Oral, resp. rate 16, weight 187 lb 9.6 oz (85.1 kg), SpO2 99 %.    Lymphatics: No cervical, supraclavicular, axillary, or inguinal nodes Resp: Lungs clear bilaterally Cardio: Regular rate and rhythm GI: No mass, no hepatosplenomegaly, nontender Vascular: No leg edema   Lab Results:  Lab Results  Component Value Date   WBC 4.0 02/08/2021   HGB 14.0 02/08/2021   HCT 41.1 02/08/2021   MCV 91.9 02/08/2021   PLT 230 02/08/2021   NEUTROABS 2.3 02/08/2021    CMP  Lab Results  Component Value Date   NA 140 02/08/2021   K 4.2 02/08/2021   CL 105 02/08/2021   CO2 28 02/08/2021   GLUCOSE 92 02/08/2021   BUN 17 02/08/2021   CREATININE 0.85 02/08/2021   CALCIUM 9.5 02/08/2021   PROT 6.4 (L) 02/08/2021   ALBUMIN 4.1 02/08/2021   AST 22 02/08/2021   ALT 27 02/08/2021   ALKPHOS 44 02/08/2021   BILITOT 0.6 02/08/2021   GFRNONAA >60 02/08/2021   GFRAA 119 11/17/2019    Lab Results  Component Value Date   CEA 3.00 01/24/2021     Medications: I have reviewed the patient's current medications.   Assessment/Plan: Colon cancer Biopsy of a descending colon mass at 40 cm revealed adenocarcinoma, normal mismatch repair protein expression Rectal mass Circumferential mass beginning at 4 cm from the anal verge-biopsy tubulovillous adenoma with high-grade dysplasia CT 11/27/2020-indeterminate 7 mm segment 4 liver lesion, no other evidence of metastatic disease MRI pelvis 12/10/2020-tumor at 12.1 cm from the anal verge, 6.4 cm from the  internal anal sphincter, T3b versus T3c, N0 Sigmoidoscopy-biopsy of rectal mass 12/22/2020-distal edge biopsy, tubulovillous adenoma; proximal edge biopsy focal invasive adenocarcinoma arising in a tubulovillous adenoma with high-grade dysplasia, lymphovascular invasion present  Xeloda/radiation 01/08/2021-02/15/2021   3.  COPD 4.  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  Family history of colon polyps 6.  Multiple colon polyps on the colonoscopy 11/16/2020-tubular adenomas 7.  Remote history of tobacco use 8.  Remote history of heavy alcohol use 9.  Vitamin B12 deficiency      Disposition: Robert Hatfield appears stable.  He has completed neoadjuvant Xeloda/radiation.  He is scheduled for rectal surgery on 05/03/2021.  There was an indeterminate segment 4 liver lesion on the initial staging CTs.  He will be referred for restaging CTs later this month.  Robert Hatfield will return for an office visit after the rectal surgery.  Betsy Coder, MD  03/23/2021  9:51 AM

## 2021-03-23 ENCOUNTER — Other Ambulatory Visit: Payer: Self-pay

## 2021-03-23 ENCOUNTER — Inpatient Hospital Stay: Payer: BC Managed Care – PPO | Attending: Oncology | Admitting: Oncology

## 2021-03-23 ENCOUNTER — Inpatient Hospital Stay: Payer: BC Managed Care – PPO

## 2021-03-23 VITALS — BP 122/89 | HR 62 | Temp 98.4°F | Resp 16 | Wt 187.6 lb

## 2021-03-23 DIAGNOSIS — C20 Malignant neoplasm of rectum: Secondary | ICD-10-CM | POA: Insufficient documentation

## 2021-03-23 DIAGNOSIS — Z23 Encounter for immunization: Secondary | ICD-10-CM | POA: Diagnosis not present

## 2021-03-23 MED ORDER — INFLUENZA VAC SPLIT QUAD 0.5 ML IM SUSY
0.5000 mL | PREFILLED_SYRINGE | Freq: Once | INTRAMUSCULAR | Status: AC
  Administered 2021-03-23: 0.5 mL via INTRAMUSCULAR

## 2021-03-23 NOTE — Patient Instructions (Signed)
Influenza, Adult °Influenza is also called "the flu." It is an infection in the lungs, nose, and throat (respiratory tract). It spreads easily from person to person (is contagious). The flu causes symptoms that are like a cold, along with high fever and body aches. °What are the causes? °This condition is caused by the influenza virus. You can get the virus by: °Breathing in droplets that are in the air after a person infected with the flu coughed or sneezed. °Touching something that has the virus on it and then touching your mouth, nose, or eyes. °What increases the risk? °Certain things may make you more likely to get the flu. These include: °Not washing your hands often. °Having close contact with many people during cold and flu season. °Touching your mouth, eyes, or nose without first washing your hands. °Not getting a flu shot every year. °You may have a higher risk for the flu, and serious problems, such as a lung infection (pneumonia), if you: °Are older than 65. °Are pregnant. °Have a weakened disease-fighting system (immune system) because of a disease or because you are taking certain medicines. °Have a long-term (chronic) condition, such as: °Heart, kidney, or lung disease. °Diabetes. °Asthma. °Have a liver disorder. °Are very overweight (morbidly obese). °Have anemia. °What are the signs or symptoms? °Symptoms usually begin suddenly and last 4-14 days. They may include: °Fever and chills. °Headaches, body aches, or muscle aches. °Sore throat. °Cough. °Runny or stuffy (congested) nose. °Feeling discomfort in your chest. °Not wanting to eat as much as normal. °Feeling weak or tired. °Feeling dizzy. °Feeling sick to your stomach or throwing up. °How is this treated? °If the flu is found early, you can be treated with antiviral medicine. This can help to reduce how bad the illness is and how long it lasts. This may be given by mouth or through an IV tube. °Taking care of yourself at home can help your  symptoms get better. Your doctor may want you to: °Take over-the-counter medicines. °Drink plenty of fluids. °The flu often goes away on its own. If you have very bad symptoms or other problems, you may be treated in a hospital. °Follow these instructions at home: °  °Activity °Rest as needed. Get plenty of sleep. °Stay home from work or school as told by your doctor. °Do not leave home until you do not have a fever for 24 hours without taking medicine. °Leave home only to go to your doctor. °Eating and drinking °Take an ORS (oral rehydration solution). This is a drink that is sold at pharmacies and stores. °Drink enough fluid to keep your pee pale yellow. °Drink clear fluids in small amounts as you are able. Clear fluids include: °Water. °Ice chips. °Fruit juice mixed with water. °Low-calorie sports drinks. °Eat bland foods that are easy to digest. Eat small amounts as you are able. These foods include: °Bananas. °Applesauce. °Rice. °Lean meats. °Toast. °Crackers. °Do not eat or drink: °Fluids that have a lot of sugar or caffeine. °Alcohol. °Spicy or fatty foods. °General instructions °Take over-the-counter and prescription medicines only as told by your doctor. °Use a cool mist humidifier to add moisture to the air in your home. This can make it easier for you to breathe. °When using a cool mist humidifier, clean it daily. Empty water and replace with clean water. °Cover your mouth and nose when you cough or sneeze. °Wash your hands with soap and water often and for at least 20 seconds. This is also important after   you cough or sneeze. If you cannot use soap and water, use alcohol-based hand sanitizer. °Keep all follow-up visits. °How is this prevented? ° °Get a flu shot every year. You may get the flu shot in late summer, fall, or winter. Ask your doctor when you should get your flu shot. °Avoid contact with people who are sick during fall and winter. This is cold and flu season. °Contact a doctor if: °You get  new symptoms. °You have: °Chest pain. °Watery poop (diarrhea). °A fever. °Your cough gets worse. °You start to have more mucus. °You feel sick to your stomach. °You throw up. °Get help right away if you: °Have shortness of breath. °Have trouble breathing. °Have skin or nails that turn a bluish color. °Have very bad pain or stiffness in your neck. °Get a sudden headache. °Get sudden pain in your face or ear. °Cannot eat or drink without throwing up. °These symptoms may represent a serious problem that is an emergency. Get medical help right away. Call your local emergency services (911 in the [place] °Do not wait to see if the symptoms will go away. °Do not drive yourself to the hospital. °Summary °Influenza is also called "the flu." It is an infection in the lungs, nose, and throat. It spreads easily from person to person. °Take over-the-counter and prescription medicines only as told by your doctor. °Getting a flu shot every year is the best way to not get the flu. °This information is not intended to replace advice given to you by your health care provider. Make sure you discuss any questions you have with your health care provider. °Document Revised: 12/24/2019 Document Reviewed: 12/24/2019 °Elsevier Patient Education © 2022 [employer] Inc. ° °

## 2021-03-27 ENCOUNTER — Other Ambulatory Visit: Payer: Self-pay | Admitting: Family Medicine

## 2021-03-27 DIAGNOSIS — F411 Generalized anxiety disorder: Secondary | ICD-10-CM

## 2021-03-28 NOTE — Telephone Encounter (Signed)
Is this okay to refill? 

## 2021-04-09 ENCOUNTER — Inpatient Hospital Stay: Payer: BC Managed Care – PPO

## 2021-04-09 ENCOUNTER — Encounter (HOSPITAL_BASED_OUTPATIENT_CLINIC_OR_DEPARTMENT_OTHER): Payer: Self-pay

## 2021-04-09 ENCOUNTER — Other Ambulatory Visit: Payer: Self-pay

## 2021-04-09 ENCOUNTER — Ambulatory Visit (HOSPITAL_BASED_OUTPATIENT_CLINIC_OR_DEPARTMENT_OTHER)
Admission: RE | Admit: 2021-04-09 | Discharge: 2021-04-09 | Disposition: A | Discharge status: Home / Self Care | Payer: BC Managed Care – PPO | Source: Ambulatory Visit | Attending: Oncology | Admitting: Oncology

## 2021-04-09 DIAGNOSIS — C19 Malignant neoplasm of rectosigmoid junction: Secondary | ICD-10-CM | POA: Diagnosis not present

## 2021-04-09 DIAGNOSIS — J439 Emphysema, unspecified: Secondary | ICD-10-CM | POA: Diagnosis not present

## 2021-04-09 DIAGNOSIS — C20 Malignant neoplasm of rectum: Secondary | ICD-10-CM | POA: Diagnosis not present

## 2021-04-09 LAB — BASIC METABOLIC PANEL - CANCER CENTER ONLY
Anion gap: 8 (ref 5–15)
BUN: 14 mg/dL (ref 6–20)
CO2: 28 mmol/L (ref 22–32)
Calcium: 9.9 mg/dL (ref 8.9–10.3)
Chloride: 102 mmol/L (ref 98–111)
Creatinine: 0.81 mg/dL (ref 0.61–1.24)
GFR, Estimated: >60 mL/min (ref >60)
Glucose, Bld: 99 mg/dL (ref 70–99)
Potassium: 4.1 mmol/L (ref 3.5–5.1)
Sodium: 138 mmol/L (ref 135–145)

## 2021-04-09 MED ORDER — IOHEXOL 300 MG/ML  SOLN
100.0000 mL | Freq: Once | INTRAMUSCULAR | Status: AC | PRN
  Administered 2021-04-09: 100 mL via INTRAVENOUS

## 2021-04-10 ENCOUNTER — Ambulatory Visit: Payer: Self-pay | Admitting: Surgery

## 2021-04-10 ENCOUNTER — Encounter (HOSPITAL_COMMUNITY): Payer: Self-pay

## 2021-04-10 DIAGNOSIS — Z01818 Encounter for other preprocedural examination: Secondary | ICD-10-CM

## 2021-04-10 NOTE — Progress Notes (Addendum)
PCP - Rita Ohara  LOV 11-22-20 epic Cardiologist - no  PPM/ICD -  Device Orders -  Rep Notified -   Chest x-ray -  EKG -  Stress Test -  ECHO -  Cardiac Cath -  BMP 04-09-21 epic  Sleep Study -  CPAP -   Fasting Blood Sugar -  Checks Blood Sugar _____ times a day  Blood Thinner Instructions: Aspirin Instructions:  ERAS Protcol - PRE-SURGERY Ensure   COVID TEST- 05-01-21 COVID vaccine - [place]  fully vaccinated  Activity--Able to walk a flight of stairs without SOB Anesthesia review:   Patient denies shortness of breath, fever, cough and chest pain at PAT appointment   All instructions explained to the patient, with a verbal understanding of the material. Patient agrees to go over the instructions while at home for a better understanding. Patient also instructed to self quarantine after being tested for COVID-19. The opportunity to ask questions was provided.

## 2021-04-10 NOTE — Patient Instructions (Addendum)
DUE TO COVID-19 ONLY ONE VISITOR IS ALLOWED TO COME WITH YOU AND STAY IN THE WAITING ROOM ONLY DURING PRE OP AND PROCEDURE DAY OF SURGERY.   Up to two visitors ages 16+ are allowed at one time in a patient's room.  The visitors may rotate out with other people throughout the day.  Additionally, up to two children between the ages of 41 and 23 are allowed and do not count toward the number of allowed visitors.  Children within this age range must be accompanied by an adult visitor.  One adult visitor may remain with the patient overnight and must be in the room by 8 PM.  YOU NEED TO HAVE A COVID 19 TEST ON__12-13-22_____ between 8am-3pm______, THIS TEST MUST BE DONE BEFORE SURGERY,     Please bring completed form with you to the COVID testing site   COVID TESTING SITE [place] TEST IS COMPLETED,  PLEASE Wear a mask when in public           Your procedure is scheduled on: 05-03-21   Report to [facility] Main  Entrance   Report to admitting at       [place]   AM     Call this number if you have problems the morning of surgery [phone]   Remember: FOLLOW BOWEL PREP PER MD ORDERS   follow a clear liquid diet the day of your bowel prep to prevent dehydration  DRINK 2 PRESURGERY ENSURE DRINKS THE NIGHT BEFORE SURGERY AT  1000 PM  AND 1 PRESURGERY DRINK THE DAY OF THE PROCEDURE 3 HOURS PRIOR TO SCHEDULED SURGERY.   NO SOLIDS AFTER MIDNIGHT THE DAY PRIOR TO THE SURGERY. NOTHING BY MOUTH EXCEPT CLEAR LIQUIDS UNTIL THREE HOURS PRIOR TO SCHEDULED SURGERY.   PLEASE FINISH PRESURGERY ENSURE DRINK PER SURGEON ORDER 3 HOURS PRIOR TO SCHEDULED SURGERY TIME WHICH NEEDS TO BE COMPLETED AT _0430 am then nothing by mouth________.      CLEAR LIQUID DIET                                                                    water Black Coffee and tea, regular and decaf No Creamer                            Plain Jell-O any favor  except red or purple                                  Fruit ices (not with fruit pulp)                                      Iced Popsicles  Cranberry, grape and apple juices Sports drinks like Gatorade Lightly seasoned clear broth or consume(fat free) Sugar, honey syrup  Sample Menu Breakfast                                Lunch                                     Supper Cranberry juice                    Beef broth                            Chicken broth Jell-O                                     Grape juice                           Apple juice Coffee or tea                        Jell-O                                      Popsicle                                                Coffee or tea                        Coffee or tea  _____________________________________________________________________     BRUSH YOUR TEETH MORNING OF SURGERY AND RINSE YOUR MOUTH OUT, NO CHEWING GUM CANDY OR MINTS.     Take these medicines the morning of surgery with A SIP OF WATER: xanax if needed                                 You may not have any metal on your body including hair pins and              piercings  Do not wear jewelry, lotions, powders,perfumes,  or      deodorant                     Men may shave face and neck.   Do not bring valuables to the hospital. [place].  Contacts, dentures or bridgework may not be worn into surgery.  You may bring a small overnight bag with you     Patients discharged the day of surgery will not be allowed to drive home. IF YOU ARE HAVING SURGERY AND GOING HOME THE SAME DAY, YOU MUST HAVE AN ADULT TO DRIVE YOU HOME AND BE WITH YOU FOR 24 HOURS. YOU MAY GO HOME BY TAXI OR UBER OR ORTHERWISE, BUT  AN ADULT MUST ACCOMPANY YOU HOME AND STAY WITH YOU FOR 24 HOURS.  Name and phone number of your driver:  Special Instructions: N/A               Please read over the following fact sheets you were given: _____________________________________________________________________             [facility] - Preparing for Surgery Before surgery, you can play an important role.  Because skin is not sterile, your skin needs to be as free of germs as possible.  You can reduce the number of germs on your skin by washing with CHG (chlorahexidine gluconate) soap before surgery.  CHG is an antiseptic cleaner which kills germs and bonds with the skin to continue killing germs even after washing. Please DO NOT use if you have an allergy to CHG or antibacterial soaps.  If your skin becomes reddened/irritated stop using the CHG and inform your nurse when you arrive at Short Stay. Do not shave (including legs and underarms) for at least 48 hours prior to the first CHG shower.  You may shave your face/neck. Please follow these instructions carefully:  1.  Shower with CHG Soap the night before surgery and the  morning of Surgery.  2.  If you choose to wash your hair, wash your hair first as usual with your  normal  shampoo.  3.  After you shampoo, rinse your hair and body thoroughly to remove the  shampoo.                           4.  Use CHG as you would any other liquid soap.  You can apply chg directly  to the skin and wash                       Gently with a scrungie or clean washcloth.  5.  Apply the CHG Soap to your body ONLY FROM THE NECK DOWN.   Do not use on face/ open                           Wound or open sores. Avoid contact with eyes, ears mouth and genitals (private parts).                       Wash face,  Genitals (private parts) with your normal soap.             6.  Wash thoroughly, paying special attention to the area where your surgery  will be performed.  7.  Thoroughly rinse your body with warm water from the neck down.  8.  DO NOT shower/wash with your normal soap after using and rinsing off  the CHG Soap.                9.  Pat yourself  dry with a clean towel.            10.  Wear clean pajamas.            11.  Place clean sheets on your bed the night of your first shower and do not  sleep with pets. Day of Surgery : Do not apply any lotions/deodorants the morning of surgery.  Please wear clean clothes to the hospital/surgery center.  FAILURE TO FOLLOW THESE INSTRUCTIONS MAY RESULT IN THE CANCELLATION OF YOUR SURGERY PATIENT SIGNATURE_________________________________  NURSE SIGNATURE__________________________________  ________________________________________________________________________    Robert Hatfield  An incentive spirometer is a tool that can help keep your lungs clear and active. This tool measures how well you are filling your lungs with each breath. Taking long deep breaths may help reverse or decrease the chance of developing breathing (pulmonary) problems (especially infection) following: A long period of time when you are unable to move or be active. BEFORE THE PROCEDURE  If the spirometer includes an indicator to show your best effort, your nurse or respiratory therapist will set it to a desired goal. If possible, sit up straight or lean slightly forward. Try not to slouch. Hold the incentive spirometer in an upright position. INSTRUCTIONS FOR USE  Sit on the edge of your bed if possible, or sit up as far as you can in bed or on a chair. Hold the incentive spirometer in an upright position. Breathe out normally. Place the mouthpiece in your mouth and seal your lips tightly around it. Breathe in slowly and as deeply as possible, raising the piston or the ball toward the top of the column. Hold your breath for 3-5 seconds or for as long as possible. Allow the piston or ball to fall to the bottom of the column. Remove the mouthpiece from your mouth and breathe out normally. Rest for a few seconds and repeat Steps 1 through 7 at least 10 times every 1-2 hours when you are awake. Take your time and take  a few normal breaths between deep breaths. The spirometer may include an indicator to show your best effort. Use the indicator as a goal to work toward during each repetition. After each set of 10 deep breaths, practice coughing to be sure your lungs are clear. If you have an incision (the cut made at the time of surgery), support your incision when coughing by placing a pillow or rolled up towels firmly against it. Once you are able to get out of bed, walk around indoors and cough well. You may stop using the incentive spirometer when instructed by your caregiver.  RISKS AND COMPLICATIONS Take your time so you do not get dizzy or light-headed. If you are in pain, you may need to take or ask for pain medication before doing incentive spirometry. It is harder to take a deep breath if you are having pain. AFTER USE Rest and breathe slowly and easily. It can be helpful to keep track of a log of your progress. Your caregiver can provide you with a simple table to help with this. If you are using the spirometer at home, follow these instructions: [place] IF:  You are having difficultly using the spirometer. You have trouble using the spirometer as often as instructed. Your pain medication is not giving enough relief while using the spirometer. You develop fever of 100.5 F (38.1 C) or higher. SEEK IMMEDIATE MEDICAL CARE IF:  You cough up bloody sputum that had not been present before. You develop fever of 102 F (38.9 C) or greater. You develop worsening pain at or near the incision site. MAKE SURE YOU:  Understand these instructions. Will watch your condition. Will get help right away if you are not doing well or get worse. Document Released: 09/16/2006 Document Revised: 07/29/2011 Document Reviewed: 11/17/2006 [facility] Patient Information 2014 [place], [place].   ________________________________________________________________________

## 2021-04-10 NOTE — Progress Notes (Signed)
Sent message, via epic in basket, requesting orders in epic from surgeon.  

## 2021-04-16 ENCOUNTER — Other Ambulatory Visit: Payer: Self-pay

## 2021-04-16 ENCOUNTER — Encounter (HOSPITAL_COMMUNITY): Payer: Self-pay

## 2021-04-16 ENCOUNTER — Telehealth: Payer: Self-pay

## 2021-04-16 ENCOUNTER — Encounter (HOSPITAL_COMMUNITY)
Admission: RE | Admit: 2021-04-16 | Discharge: 2021-04-16 | Disposition: A | Discharge status: Home / Self Care | Payer: BC Managed Care – PPO | Source: Ambulatory Visit | Attending: Surgery | Admitting: Surgery

## 2021-04-16 VITALS — BP 117/79 | HR 80 | Temp 98.8°F | Resp 16 | Ht 72.0 in | Wt 186.0 lb

## 2021-04-16 DIAGNOSIS — Z01812 Encounter for preprocedural laboratory examination: Secondary | ICD-10-CM | POA: Diagnosis not present

## 2021-04-16 DIAGNOSIS — Z01818 Encounter for other preprocedural examination: Secondary | ICD-10-CM

## 2021-04-16 HISTORY — DX: Myoneural disorder, unspecified: G70.9

## 2021-04-16 HISTORY — DX: Pneumonia, unspecified organism: J18.9

## 2021-04-16 HISTORY — DX: Unspecified osteoarthritis, unspecified site: M19.90

## 2021-04-16 LAB — TYPE AND SCREEN
ABO/RH(D): A POS
Antibody Screen: NEGATIVE

## 2021-04-16 LAB — CBC
HCT: 42.3 % (ref 39.0–52.0)
Hemoglobin: 14.2 g/dL (ref 13.0–17.0)
MCH: 31.6 pg (ref 26.0–34.0)
MCHC: 33.6 g/dL (ref 30.0–36.0)
MCV: 94.2 fL (ref 80.0–100.0)
Platelets: 271 10*3/uL (ref 150–400)
RBC: 4.49 MIL/uL (ref 4.22–5.81)
RDW: 14.6 % (ref 11.5–15.5)
WBC: 5.1 10*3/uL (ref 4.0–10.5)
nRBC: 0 % (ref 0.0–0.2)

## 2021-04-16 NOTE — Telephone Encounter (Signed)
-----   Message from Ladell Pier, MD sent at 04/16/2021  8:27 AM EST ----- Please call patient, the rectal mass is smaller, no evidence of metastatic disease, follow-up as scheduled

## 2021-04-16 NOTE — Telephone Encounter (Signed)
Pt verbalized understanding.

## 2021-04-16 NOTE — Consult Note (Signed)
[place] Nurse requested for preoperative stoma site marking  Discussed surgical procedure and stoma creation with patient and family.  Explained role of the [place] nurse team.  Provided the patient with educational booklet and provided samples of pouching options.  Answered patient and family questions.   Examined patient lying, sitting, and standing in order to place the marking in the patient's visual field, away from any creases or abdominal contour issues and within the rectus muscle.  Attempted to mark below the patient's belt line.   Marked for diverting loop ileostomy in the RLQ  ___4_cm to the right of the umbilicus and  ___9_ cm below the umbilicus.  Patient's abdomen cleansed with CHG wipes at site markings, allowed to air dry prior to marking.Covered mark with thin film transparent dressing to preserve mark until date of surgery.   [place] Nurse team will follow up with patient after surgery for continue ostomy care and teaching.  [place] MSN, [place], [place], [place]

## 2021-04-18 LAB — HEMOGLOBIN A1C
Hgb A1c MFr Bld: 5.6 % (ref 4.8–5.6)
Mean Plasma Glucose: 114 mg/dL

## 2021-04-30 ENCOUNTER — Other Ambulatory Visit: Payer: Self-pay | Admitting: Family Medicine

## 2021-04-30 DIAGNOSIS — F411 Generalized anxiety disorder: Secondary | ICD-10-CM

## 2021-04-30 NOTE — Telephone Encounter (Signed)
Spoke with patient and he scheduled in office for 05/28/21.

## 2021-04-30 NOTE — Telephone Encounter (Signed)
Is this okay to refill? 

## 2021-04-30 NOTE — Telephone Encounter (Signed)
Advise pt that I'd like to see him to discuss his anxiety. Seems to be needing #45 every month (used to last longer).  I know he has had a lot on plate, but I think it is time to discuss his anxiety, and whether or not additional medications might be helpful. Please schedule for 30 min OV, can be virtual.  I did send this refill in, want to see him prior to any add'l fills

## 2021-05-01 ENCOUNTER — Other Ambulatory Visit: Payer: Self-pay | Admitting: Surgery

## 2021-05-02 LAB — SARS CORONAVIRUS 2 (TAT 6-24 HRS): SARS Coronavirus 2: NEGATIVE

## 2021-05-02 NOTE — Anesthesia Preprocedure Evaluation (Addendum)
Anesthesia Evaluation  Patient identified by MRN, date of birth, ID band Patient awake    Reviewed: Allergy & Precautions, H&P , NPO status , Patient's Chart, lab work & pertinent test results  Airway Mallampati: II  TM Distance: >3 FB Neck ROM: Full    Dental no notable dental hx. (+) Teeth Intact, Dental Advisory Given, Caps   Pulmonary asthma , former smoker,    Pulmonary exam normal breath sounds clear to auscultation       Cardiovascular Exercise Tolerance: Good negative cardio ROS Normal cardiovascular exam Rhythm:Regular Rate:Normal     Neuro/Psych PSYCHIATRIC DISORDERS Anxiety Depression  Neuromuscular disease    GI/Hepatic negative GI ROS, Neg liver ROS,   Endo/Other  negative endocrine ROS  Renal/GU negative Renal ROS  negative genitourinary   Musculoskeletal  (+) Arthritis , Osteoarthritis,    Abdominal   Peds negative pediatric ROS (+)  Hematology negative hematology ROS (+)   Anesthesia Other Findings   Reproductive/Obstetrics negative OB ROS                            Anesthesia Physical Anesthesia Plan  ASA: 2  Anesthesia Plan: General   Post-op Pain Management: Ofirmev IV (intra-op), Dilaudid IV and Ketamine IV   Induction: Intravenous  PONV Risk Score and Plan: Ondansetron, Dexamethasone, Treatment may vary due to age or medical condition and Midazolam  Airway Management Planned: Oral ETT  Additional Equipment: None  Intra-op Plan:   Post-operative Plan: Extubation in OR  Informed Consent: I have reviewed the patients History and Physical, chart, labs and discussed the procedure including the risks, benefits and alternatives for the proposed anesthesia with the patient or authorized representative who has indicated his/her understanding and acceptance.       Plan Discussed with: Anesthesiologist and CRNA  Anesthesia Plan Comments: (  )        Anesthesia Quick Evaluation

## 2021-05-03 ENCOUNTER — Encounter (HOSPITAL_COMMUNITY): Payer: Self-pay | Admitting: Surgery

## 2021-05-03 ENCOUNTER — Other Ambulatory Visit: Payer: Self-pay

## 2021-05-03 ENCOUNTER — Encounter (HOSPITAL_COMMUNITY)
Admission: RE | Disposition: A | Discharge status: Home Health | Payer: Self-pay | Source: Ambulatory Visit | Attending: Surgery

## 2021-05-03 ENCOUNTER — Inpatient Hospital Stay (HOSPITAL_COMMUNITY): Payer: BC Managed Care – PPO | Admitting: Anesthesiology

## 2021-05-03 ENCOUNTER — Inpatient Hospital Stay (HOSPITAL_COMMUNITY)
Admission: RE | Admit: 2021-05-03 | Discharge: 2021-05-07 | DRG: 330 | Disposition: A | Discharge status: Home Health | Payer: BC Managed Care – PPO | Source: Ambulatory Visit | Attending: Surgery | Admitting: Surgery

## 2021-05-03 DIAGNOSIS — Z87891 Personal history of nicotine dependence: Secondary | ICD-10-CM

## 2021-05-03 DIAGNOSIS — Z803 Family history of malignant neoplasm of breast: Secondary | ICD-10-CM

## 2021-05-03 DIAGNOSIS — C19 Malignant neoplasm of rectosigmoid junction: Secondary | ICD-10-CM | POA: Diagnosis not present

## 2021-05-03 DIAGNOSIS — Z800 Family history of malignant neoplasm of digestive organs: Secondary | ICD-10-CM

## 2021-05-03 DIAGNOSIS — C186 Malignant neoplasm of descending colon: Secondary | ICD-10-CM | POA: Diagnosis present

## 2021-05-03 DIAGNOSIS — Z932 Ileostomy status: Secondary | ICD-10-CM

## 2021-05-03 DIAGNOSIS — F32A Depression, unspecified: Secondary | ICD-10-CM | POA: Diagnosis present

## 2021-05-03 DIAGNOSIS — K660 Peritoneal adhesions (postprocedural) (postinfection): Secondary | ICD-10-CM | POA: Diagnosis present

## 2021-05-03 DIAGNOSIS — Z8042 Family history of malignant neoplasm of prostate: Secondary | ICD-10-CM | POA: Diagnosis not present

## 2021-05-03 DIAGNOSIS — F419 Anxiety disorder, unspecified: Secondary | ICD-10-CM | POA: Diagnosis present

## 2021-05-03 DIAGNOSIS — Z8249 Family history of ischemic heart disease and other diseases of the circulatory system: Secondary | ICD-10-CM | POA: Diagnosis not present

## 2021-05-03 DIAGNOSIS — Z801 Family history of malignant neoplasm of trachea, bronchus and lung: Secondary | ICD-10-CM | POA: Diagnosis not present

## 2021-05-03 DIAGNOSIS — E781 Pure hyperglyceridemia: Secondary | ICD-10-CM | POA: Diagnosis present

## 2021-05-03 DIAGNOSIS — R112 Nausea with vomiting, unspecified: Secondary | ICD-10-CM | POA: Diagnosis not present

## 2021-05-03 DIAGNOSIS — C20 Malignant neoplasm of rectum: Secondary | ICD-10-CM | POA: Diagnosis not present

## 2021-05-03 HISTORY — PX: XI ROBOTIC ASSISTED LOWER ANTERIOR RESECTION: SHX6558

## 2021-05-03 HISTORY — PX: FLEXIBLE SIGMOIDOSCOPY: SHX5431

## 2021-05-03 HISTORY — PX: DIVERTING ILEOSTOMY: SHX5799

## 2021-05-03 SURGERY — RESECTION, RECTUM, LOW ANTERIOR, ROBOT-ASSISTED
Anesthesia: General

## 2021-05-03 MED ORDER — HEPARIN SODIUM (PORCINE) 5000 UNIT/ML IJ SOLN
5000.0000 [IU] | Freq: Three times a day (TID) | INTRAMUSCULAR | Status: DC
  Administered 2021-05-03 – 2021-05-07 (×11): 5000 [IU] via SUBCUTANEOUS
  Filled 2021-05-03 (×11): qty 1

## 2021-05-03 MED ORDER — HYDRALAZINE HCL 20 MG/ML IJ SOLN: 10.0000 mg | INTRAMUSCULAR | Status: DC | PRN

## 2021-05-03 MED ORDER — LIDOCAINE 2% (20 MG/ML) 5 ML SYRINGE
INTRAMUSCULAR | Status: DC | PRN
  Administered 2021-05-03: 100 mg via INTRAVENOUS

## 2021-05-03 MED ORDER — ENSURE SURGERY PO LIQD
237.0000 mL | Freq: Two times a day (BID) | ORAL | Status: DC
  Administered 2021-05-04 – 2021-05-07 (×5): 237 mL via ORAL

## 2021-05-03 MED ORDER — BUPIVACAINE LIPOSOME 1.3 % IJ SUSP
INTRAMUSCULAR | Status: DC | PRN
  Administered 2021-05-03: 20 mL

## 2021-05-03 MED ORDER — BUPIVACAINE-EPINEPHRINE (PF) 0.25% -1:200000 IJ SOLN
INTRAMUSCULAR | Status: AC
  Filled 2021-05-03: qty 30

## 2021-05-03 MED ORDER — BUPIVACAINE LIPOSOME 1.3 % IJ SUSP: 20.0000 mL | Freq: Once | INTRAMUSCULAR | Status: DC

## 2021-05-03 MED ORDER — ALUM & MAG HYDROXIDE-SIMETH 200-200-20 MG/5ML PO SUSP
30.0000 mL | Freq: Four times a day (QID) | ORAL | Status: DC | PRN
  Administered 2021-05-03 – 2021-05-05 (×2): 30 mL via ORAL
  Filled 2021-05-03 (×2): qty 30

## 2021-05-03 MED ORDER — ONDANSETRON HCL 4 MG PO TABS: 4.0000 mg | ORAL_TABLET | Freq: Four times a day (QID) | ORAL | Status: DC | PRN

## 2021-05-03 MED ORDER — SIMETHICONE 80 MG PO CHEW
40.0000 mg | CHEWABLE_TABLET | Freq: Four times a day (QID) | ORAL | Status: DC | PRN
  Administered 2021-05-05: 40 mg via ORAL
  Filled 2021-05-03 (×2): qty 1

## 2021-05-03 MED ORDER — HYDROMORPHONE HCL 1 MG/ML IJ SOLN
0.5000 mg | INTRAMUSCULAR | Status: DC | PRN
  Administered 2021-05-03 – 2021-05-04 (×4): 0.5 mg via INTRAVENOUS
  Filled 2021-05-03 (×4): qty 0.5

## 2021-05-03 MED ORDER — ACETAMINOPHEN 160 MG/5ML PO SOLN: 325.0000 mg | ORAL | Status: DC | PRN

## 2021-05-03 MED ORDER — OXYCODONE HCL 5 MG/5ML PO SOLN: 5.0000 mg | Freq: Once | ORAL | Status: DC | PRN

## 2021-05-03 MED ORDER — DIPHENHYDRAMINE HCL 12.5 MG/5ML PO ELIX: 12.5000 mg | ORAL_SOLUTION | Freq: Four times a day (QID) | ORAL | Status: DC | PRN

## 2021-05-03 MED ORDER — PROPOFOL 10 MG/ML IV BOLUS
INTRAVENOUS | Status: AC
  Filled 2021-05-03: qty 20

## 2021-05-03 MED ORDER — LIDOCAINE HCL (PF) 2 % IJ SOLN
INTRAMUSCULAR | Status: DC | PRN
  Administered 2021-05-03: 1.5 mg/kg/h via INTRADERMAL

## 2021-05-03 MED ORDER — IBUPROFEN 400 MG PO TABS: 600.0000 mg | ORAL_TABLET | Freq: Four times a day (QID) | ORAL | Status: DC | PRN

## 2021-05-03 MED ORDER — BISACODYL 5 MG PO TBEC: 20.0000 mg | DELAYED_RELEASE_TABLET | Freq: Once | ORAL | Status: DC

## 2021-05-03 MED ORDER — LIDOCAINE HCL (PF) 2 % IJ SOLN
INTRAMUSCULAR | Status: AC
  Filled 2021-05-03: qty 5

## 2021-05-03 MED ORDER — SODIUM CHLORIDE 0.9 % IV SOLN
2.0000 g | INTRAVENOUS | Status: AC
  Administered 2021-05-03: 2 g via INTRAVENOUS
  Filled 2021-05-03: qty 2

## 2021-05-03 MED ORDER — STERILE WATER FOR INJECTION IJ SOLN
INTRAMUSCULAR | Status: DC | PRN
  Administered 2021-05-03: 1 mL via INTRAVENOUS

## 2021-05-03 MED ORDER — ALVIMOPAN 12 MG PO CAPS: 12.0000 mg | ORAL_CAPSULE | Freq: Two times a day (BID) | ORAL | Status: DC

## 2021-05-03 MED ORDER — ROCURONIUM BROMIDE 10 MG/ML (PF) SYRINGE
PREFILLED_SYRINGE | INTRAVENOUS | Status: AC
  Filled 2021-05-03: qty 10

## 2021-05-03 MED ORDER — ENSURE PRE-SURGERY PO LIQD: 592.0000 mL | Freq: Once | ORAL | Status: DC

## 2021-05-03 MED ORDER — LACTATED RINGERS IV SOLN: INTRAVENOUS | Status: DC

## 2021-05-03 MED ORDER — KETAMINE HCL-SODIUM CHLORIDE 100-0.9 MG/10ML-% IV SOSY
PREFILLED_SYRINGE | INTRAVENOUS | Status: AC
  Filled 2021-05-03: qty 10

## 2021-05-03 MED ORDER — CHLORHEXIDINE GLUCONATE 0.12 % MT SOLN
15.0000 mL | Freq: Once | OROMUCOSAL | Status: AC
  Administered 2021-05-03: 15 mL via OROMUCOSAL

## 2021-05-03 MED ORDER — DIPHENHYDRAMINE HCL 50 MG/ML IJ SOLN
12.5000 mg | Freq: Four times a day (QID) | INTRAMUSCULAR | Status: DC | PRN
  Administered 2021-05-05: 12.5 mg via INTRAVENOUS
  Filled 2021-05-03: qty 1

## 2021-05-03 MED ORDER — TRAMADOL HCL 50 MG PO TABS
50.0000 mg | ORAL_TABLET | Freq: Four times a day (QID) | ORAL | Status: DC | PRN
  Administered 2021-05-03: 50 mg via ORAL
  Filled 2021-05-03: qty 1

## 2021-05-03 MED ORDER — HEPARIN SODIUM (PORCINE) 5000 UNIT/ML IJ SOLN
5000.0000 [IU] | Freq: Once | INTRAMUSCULAR | Status: AC
  Administered 2021-05-03: 5000 [IU] via SUBCUTANEOUS
  Filled 2021-05-03: qty 1

## 2021-05-03 MED ORDER — NEOMYCIN SULFATE 500 MG PO TABS: 1000.0000 mg | ORAL_TABLET | ORAL | Status: DC

## 2021-05-03 MED ORDER — ONDANSETRON HCL 4 MG/2ML IJ SOLN
INTRAMUSCULAR | Status: DC | PRN
  Administered 2021-05-03: 4 mg via INTRAVENOUS

## 2021-05-03 MED ORDER — FENTANYL CITRATE PF 50 MCG/ML IJ SOSY
25.0000 ug | PREFILLED_SYRINGE | INTRAMUSCULAR | Status: DC | PRN
  Administered 2021-05-03: 50 ug via INTRAVENOUS

## 2021-05-03 MED ORDER — ALVIMOPAN 12 MG PO CAPS
12.0000 mg | ORAL_CAPSULE | ORAL | Status: AC
  Administered 2021-05-03: 12 mg via ORAL
  Filled 2021-05-03: qty 1

## 2021-05-03 MED ORDER — BUPIVACAINE LIPOSOME 1.3 % IJ SUSP
INTRAMUSCULAR | Status: AC
  Filled 2021-05-03: qty 20

## 2021-05-03 MED ORDER — ACETAMINOPHEN 10 MG/ML IV SOLN
INTRAVENOUS | Status: AC
  Filled 2021-05-03: qty 100

## 2021-05-03 MED ORDER — ONDANSETRON HCL 4 MG/2ML IJ SOLN: 4.0000 mg | Freq: Once | INTRAMUSCULAR | Status: DC | PRN

## 2021-05-03 MED ORDER — MIDAZOLAM HCL 2 MG/2ML IJ SOLN
INTRAMUSCULAR | Status: AC
  Filled 2021-05-03: qty 2

## 2021-05-03 MED ORDER — MIDAZOLAM HCL 5 MG/5ML IJ SOLN
INTRAMUSCULAR | Status: DC | PRN
  Administered 2021-05-03: 2 mg via INTRAVENOUS

## 2021-05-03 MED ORDER — CHLORHEXIDINE GLUCONATE CLOTH 2 % EX PADS: 6.0000 | MEDICATED_PAD | Freq: Once | CUTANEOUS | Status: DC

## 2021-05-03 MED ORDER — ACETAMINOPHEN 10 MG/ML IV SOLN
INTRAVENOUS | Status: DC | PRN
  Administered 2021-05-03: 1000 mg via INTRAVENOUS

## 2021-05-03 MED ORDER — PROPOFOL 10 MG/ML IV BOLUS
INTRAVENOUS | Status: DC | PRN
  Administered 2021-05-03: 160 mg via INTRAVENOUS

## 2021-05-03 MED ORDER — DEXAMETHASONE SODIUM PHOSPHATE 10 MG/ML IJ SOLN
INTRAMUSCULAR | Status: AC
  Filled 2021-05-03: qty 1

## 2021-05-03 MED ORDER — POLYETHYLENE GLYCOL 3350 17 GM/SCOOP PO POWD: 1.0000 | Freq: Once | ORAL | Status: DC

## 2021-05-03 MED ORDER — DEXAMETHASONE SODIUM PHOSPHATE 10 MG/ML IJ SOLN
INTRAMUSCULAR | Status: DC | PRN
  Administered 2021-05-03: 10 mg via INTRAVENOUS

## 2021-05-03 MED ORDER — ACETAMINOPHEN 325 MG PO TABS: 325.0000 mg | ORAL_TABLET | ORAL | Status: DC | PRN

## 2021-05-03 MED ORDER — ACETAMINOPHEN 500 MG PO TABS
1000.0000 mg | ORAL_TABLET | Freq: Four times a day (QID) | ORAL | Status: DC
  Administered 2021-05-03 – 2021-05-07 (×13): 1000 mg via ORAL
  Filled 2021-05-03 (×15): qty 2

## 2021-05-03 MED ORDER — ROCURONIUM BROMIDE 10 MG/ML (PF) SYRINGE
PREFILLED_SYRINGE | INTRAVENOUS | Status: DC | PRN
  Administered 2021-05-03: 30 mg via INTRAVENOUS
  Administered 2021-05-03: 100 mg via INTRAVENOUS
  Administered 2021-05-03: 20 mg via INTRAVENOUS

## 2021-05-03 MED ORDER — SUGAMMADEX SODIUM 200 MG/2ML IV SOLN
INTRAVENOUS | Status: DC | PRN
  Administered 2021-05-03: 200 mg via INTRAVENOUS

## 2021-05-03 MED ORDER — LIDOCAINE HCL (PF) 2 % IJ SOLN
INTRAMUSCULAR | Status: AC
  Filled 2021-05-03: qty 25

## 2021-05-03 MED ORDER — OXYCODONE HCL 5 MG PO TABS: 5.0000 mg | ORAL_TABLET | Freq: Once | ORAL | Status: DC | PRN

## 2021-05-03 MED ORDER — 0.9 % SODIUM CHLORIDE (POUR BTL) OPTIME
TOPICAL | Status: DC | PRN
  Administered 2021-05-03: 2000 mL

## 2021-05-03 MED ORDER — LACTATED RINGERS IR SOLN
Status: DC | PRN
  Administered 2021-05-03: 1000 mL

## 2021-05-03 MED ORDER — MEPERIDINE HCL 50 MG/ML IJ SOLN: 6.2500 mg | INTRAMUSCULAR | Status: DC | PRN

## 2021-05-03 MED ORDER — BUPIVACAINE-EPINEPHRINE (PF) 0.25% -1:200000 IJ SOLN
INTRAMUSCULAR | Status: DC | PRN
  Administered 2021-05-03: 30 mL

## 2021-05-03 MED ORDER — ONDANSETRON HCL 4 MG/2ML IJ SOLN
4.0000 mg | Freq: Four times a day (QID) | INTRAMUSCULAR | Status: DC | PRN
  Administered 2021-05-05 – 2021-05-06 (×2): 4 mg via INTRAVENOUS
  Filled 2021-05-03 (×2): qty 2

## 2021-05-03 MED ORDER — FENTANYL CITRATE (PF) 250 MCG/5ML IJ SOLN
INTRAMUSCULAR | Status: AC
  Filled 2021-05-03: qty 5

## 2021-05-03 MED ORDER — FENTANYL CITRATE (PF) 250 MCG/5ML IJ SOLN
INTRAMUSCULAR | Status: DC | PRN
  Administered 2021-05-03: 50 ug via INTRAVENOUS
  Administered 2021-05-03: 100 ug via INTRAVENOUS
  Administered 2021-05-03 (×2): 50 ug via INTRAVENOUS

## 2021-05-03 MED ORDER — ORAL CARE MOUTH RINSE: 15.0000 mL | Freq: Once | OROMUCOSAL | Status: AC

## 2021-05-03 MED ORDER — METRONIDAZOLE 500 MG PO TABS: 1000.0000 mg | ORAL_TABLET | ORAL | Status: DC

## 2021-05-03 MED ORDER — ENSURE PRE-SURGERY PO LIQD: 296.0000 mL | Freq: Once | ORAL | Status: DC

## 2021-05-03 MED ORDER — ACETAMINOPHEN 500 MG PO TABS
1000.0000 mg | ORAL_TABLET | ORAL | Status: AC
  Administered 2021-05-03: 1000 mg via ORAL
  Filled 2021-05-03: qty 2

## 2021-05-03 MED ORDER — ALPRAZOLAM 0.25 MG PO TABS
0.2500 mg | ORAL_TABLET | Freq: Three times a day (TID) | ORAL | Status: DC | PRN
Start: 2021-05-03 — End: 2021-05-07
  Administered 2021-05-04 – 2021-05-06 (×4): 0.5 mg via ORAL
  Filled 2021-05-03 (×4): qty 2

## 2021-05-03 MED ORDER — STERILE WATER FOR IRRIGATION IR SOLN
Status: DC | PRN
  Administered 2021-05-03: 500 mL

## 2021-05-03 MED ORDER — FENTANYL CITRATE PF 50 MCG/ML IJ SOSY
PREFILLED_SYRINGE | INTRAMUSCULAR | Status: AC
  Administered 2021-05-03: 50 ug via INTRAVENOUS
  Filled 2021-05-03: qty 2

## 2021-05-03 MED ORDER — KETAMINE HCL-SODIUM CHLORIDE 100-0.9 MG/10ML-% IV SOSY
PREFILLED_SYRINGE | INTRAVENOUS | Status: DC | PRN
  Administered 2021-05-03 (×2): 10 mg via INTRAVENOUS
  Administered 2021-05-03: 20 mg via INTRAVENOUS
  Administered 2021-05-03: 10 mg via INTRAVENOUS

## 2021-05-03 MED ORDER — VITAMIN B-12 1000 MCG PO TABS
1000.0000 ug | ORAL_TABLET | Freq: Every day | ORAL | Status: DC
  Administered 2021-05-03 – 2021-05-07 (×5): 1000 ug via ORAL
  Filled 2021-05-03 (×5): qty 1

## 2021-05-03 SURGICAL SUPPLY — 125 items
ADH SKN CLS APL DERMABOND .7 (GAUZE/BANDAGES/DRESSINGS) ×1
APL PRP STRL LF DISP 70% ISPRP (MISCELLANEOUS) ×1
APPLIER CLIP 5 13 M/L LIGAMAX5 (MISCELLANEOUS)
APPLIER CLIP ROT 10 11.4 M/L (STAPLE)
APR CLP MED LRG 11.4X10 (STAPLE)
APR CLP MED LRG 5 ANG JAW (MISCELLANEOUS)
BAG COUNTER SPONGE SURGICOUNT (BAG) IMPLANT
BAG SPNG CNTER NS LX DISP (BAG)
BAG SURGICOUNT SPONGE COUNTING (BAG)
BLADE EXTENDED COATED 6.5IN (ELECTRODE) ×3 IMPLANT
CANNULA REDUC XI 12-8 STAPL (CANNULA) ×2
CANNULA REDUC XI 12-8MM STAPL (CANNULA) ×1
CANNULA REDUCER 12-8 DVNC XI (CANNULA) ×1 IMPLANT
CELLS DAT CNTRL 66122 CELL SVR (MISCELLANEOUS) IMPLANT
CHLORAPREP W/TINT 26 (MISCELLANEOUS) ×3 IMPLANT
CLIP APPLIE 5 13 M/L LIGAMAX5 (MISCELLANEOUS) IMPLANT
CLIP APPLIE ROT 10 11.4 M/L (STAPLE) IMPLANT
CLIP LIGATING HEM O LOK PURPLE (MISCELLANEOUS) IMPLANT
CLIP LIGATING HEMO O LOK GREEN (MISCELLANEOUS) IMPLANT
COVER SURGICAL LIGHT HANDLE (MISCELLANEOUS) ×6 IMPLANT
COVER TIP SHEARS 8 DVNC (MISCELLANEOUS) ×1 IMPLANT
COVER TIP SHEARS 8MM DA VINCI (MISCELLANEOUS) ×3
DECANTER SPIKE VIAL GLASS SM (MISCELLANEOUS) ×3 IMPLANT
DEFOGGER SCOPE WARMER CLEARIFY (MISCELLANEOUS) ×3 IMPLANT
DERMABOND ADVANCED (GAUZE/BANDAGES/DRESSINGS) ×2
DERMABOND ADVANCED .7 DNX12 (GAUZE/BANDAGES/DRESSINGS) IMPLANT
DEVICE TROCAR PUNCTURE CLOSURE (ENDOMECHANICALS) IMPLANT
DRAIN CHANNEL 19F RND (DRAIN) ×3 IMPLANT
DRAPE ARM DVNC X/XI (DISPOSABLE) ×4 IMPLANT
DRAPE COLUMN DVNC XI (DISPOSABLE) ×1 IMPLANT
DRAPE DA VINCI XI ARM (DISPOSABLE) ×12
DRAPE DA VINCI XI COLUMN (DISPOSABLE) ×3
DRAPE SURG IRRIG POUCH 19X23 (DRAPES) ×3 IMPLANT
DRSG OPSITE POSTOP 4X10 (GAUZE/BANDAGES/DRESSINGS) IMPLANT
DRSG OPSITE POSTOP 4X6 (GAUZE/BANDAGES/DRESSINGS) ×2 IMPLANT
DRSG OPSITE POSTOP 4X8 (GAUZE/BANDAGES/DRESSINGS) IMPLANT
DRSG TEGADERM 2-3/8X2-3/4 SM (GAUZE/BANDAGES/DRESSINGS) ×9 IMPLANT
DRSG TEGADERM 4X4.75 (GAUZE/BANDAGES/DRESSINGS) ×1 IMPLANT
ELECT REM PT RETURN 15FT ADLT (MISCELLANEOUS) ×3 IMPLANT
ENDOLOOP SUT PDS II  0 18 (SUTURE)
ENDOLOOP SUT PDS II 0 18 (SUTURE) IMPLANT
EVACUATOR SILICONE 100CC (DRAIN) ×3 IMPLANT
GAUZE SPONGE 2X2 8PLY STRL LF (GAUZE/BANDAGES/DRESSINGS) ×1 IMPLANT
GAUZE SPONGE 4X4 12PLY STRL (GAUZE/BANDAGES/DRESSINGS) ×2 IMPLANT
GLOVE SURG ENC MOIS LTX SZ7.5 (GLOVE) ×9 IMPLANT
GLOVE SURG UNDER LTX SZ8 (GLOVE) ×9 IMPLANT
GOWN STRL REUS W/TWL XL LVL3 (GOWN DISPOSABLE) ×15 IMPLANT
GRASPER SUT TROCAR 14GX15 (MISCELLANEOUS) IMPLANT
HOLDER FOLEY CATH W/STRAP (MISCELLANEOUS) ×3 IMPLANT
IRRIG SUCT STRYKERFLOW 2 WTIP (MISCELLANEOUS) ×3
IRRIGATION SUCT STRKRFLW 2 WTP (MISCELLANEOUS) ×1 IMPLANT
KIT PROCEDURE DA VINCI SI (MISCELLANEOUS) ×3
KIT PROCEDURE DVNC SI (MISCELLANEOUS) IMPLANT
KIT TURNOVER KIT A (KITS) ×2 IMPLANT
NDL INSUFFLATION 14GA 120MM (NEEDLE) ×1 IMPLANT
NEEDLE INSUFFLATION 14GA 120MM (NEEDLE) ×3 IMPLANT
PACK CARDIOVASCULAR III (CUSTOM PROCEDURE TRAY) ×3 IMPLANT
PACK COLON (CUSTOM PROCEDURE TRAY) ×3 IMPLANT
PAD POSITIONING PINK XL (MISCELLANEOUS) ×3 IMPLANT
PENCIL SMOKE EVACUATOR (MISCELLANEOUS) IMPLANT
PROTECTOR NERVE ULNAR (MISCELLANEOUS) ×6 IMPLANT
RELOAD STAPLE 45 3.5 BLU DVNC (STAPLE) IMPLANT
RELOAD STAPLE 45 4.3 GRN DVNC (STAPLE) IMPLANT
RELOAD STAPLE 60 3.5 BLU DVNC (STAPLE) IMPLANT
RELOAD STAPLE 60 4.3 GRN DVNC (STAPLE) IMPLANT
RELOAD STAPLER 3.5X45 BLU DVNC (STAPLE) IMPLANT
RELOAD STAPLER 3.5X60 BLU DVNC (STAPLE) IMPLANT
RELOAD STAPLER 4.3X45 GRN DVNC (STAPLE) IMPLANT
RELOAD STAPLER 4.3X60 GRN DVNC (STAPLE) ×3 IMPLANT
RETRACTOR WND ALEXIS 18 MED (MISCELLANEOUS) IMPLANT
RTRCTR WOUND ALEXIS 18CM MED (MISCELLANEOUS)
SCISSORS LAP 5X35 DISP (ENDOMECHANICALS) ×2 IMPLANT
SEAL CANN UNIV 5-8 DVNC XI (MISCELLANEOUS) ×4 IMPLANT
SEAL XI 5MM-8MM UNIVERSAL (MISCELLANEOUS) ×12
SEALER VESSEL DA VINCI XI (MISCELLANEOUS) ×3
SEALER VESSEL EXT DVNC XI (MISCELLANEOUS) ×1 IMPLANT
SLEEVE ADV FIXATION 5X100MM (TROCAR) IMPLANT
SOLUTION ELECTROLUBE (MISCELLANEOUS) ×3 IMPLANT
SPONGE GAUZE 2X2 STER 10/PKG (GAUZE/BANDAGES/DRESSINGS) ×2
STAPLER 60 DA VINCI SURE FORM (STAPLE) ×3
STAPLER 60 SUREFORM DVNC (STAPLE) IMPLANT
STAPLER CANNULA SEAL DVNC XI (STAPLE) ×1 IMPLANT
STAPLER CANNULA SEAL XI (STAPLE) ×3
STAPLER ECHELON POWER CIR 29 (STAPLE) ×2 IMPLANT
STAPLER ECHELON POWER CIR 31 (STAPLE) IMPLANT
STAPLER RELOAD 3.5X45 BLU DVNC (STAPLE)
STAPLER RELOAD 3.5X45 BLUE (STAPLE)
STAPLER RELOAD 3.5X60 BLU DVNC (STAPLE)
STAPLER RELOAD 3.5X60 BLUE (STAPLE)
STAPLER RELOAD 4.3X45 GREEN (STAPLE)
STAPLER RELOAD 4.3X45 GRN DVNC (STAPLE)
STAPLER RELOAD 4.3X60 GREEN (STAPLE) ×9
STAPLER RELOAD 4.3X60 GRN DVNC (STAPLE) ×3
STOPCOCK 4 WAY LG BORE MALE ST (IV SETS) ×2 IMPLANT
SURGILUBE 2OZ TUBE FLIPTOP (MISCELLANEOUS) ×3 IMPLANT
SUT MNCRL AB 4-0 PS2 18 (SUTURE) ×3 IMPLANT
SUT PDS AB 1 CT1 27 (SUTURE) ×2 IMPLANT
SUT PDS AB 1 TP1 96 (SUTURE) IMPLANT
SUT PROLENE 0 CT 2 (SUTURE) IMPLANT
SUT PROLENE 2 0 KS (SUTURE) ×3 IMPLANT
SUT PROLENE 2 0 SH DA (SUTURE) IMPLANT
SUT SILK 2 0 (SUTURE)
SUT SILK 2 0 SH CR/8 (SUTURE) IMPLANT
SUT SILK 2-0 18XBRD TIE 12 (SUTURE) IMPLANT
SUT SILK 3 0 (SUTURE) ×3
SUT SILK 3 0 SH CR/8 (SUTURE) ×3 IMPLANT
SUT SILK 3-0 18XBRD TIE 12 (SUTURE) ×1 IMPLANT
SUT V-LOC BARB 180 2/0GR6 GS22 (SUTURE)
SUT VIC AB 3-0 SH 18 (SUTURE) ×2 IMPLANT
SUT VIC AB 3-0 SH 27 (SUTURE)
SUT VIC AB 3-0 SH 27XBRD (SUTURE) IMPLANT
SUT VICRYL 0 UR6 27IN ABS (SUTURE) ×3 IMPLANT
SUTURE V-LC BRB 180 2/0GR6GS22 (SUTURE) IMPLANT
SYR 10ML LL (SYRINGE) ×3 IMPLANT
SYS LAPSCP GELPORT 120MM (MISCELLANEOUS)
SYS WOUND ALEXIS 18CM MED (MISCELLANEOUS) ×3
SYSTEM LAPSCP GELPORT 120MM (MISCELLANEOUS) IMPLANT
SYSTEM WOUND ALEXIS 18CM MED (MISCELLANEOUS) ×1 IMPLANT
TAPE UMBILICAL 1/8 X36 TWILL (MISCELLANEOUS) ×3 IMPLANT
TOWEL OR NON WOVEN STRL DISP B (DISPOSABLE) ×3 IMPLANT
TRAY FOLEY MTR SLVR 16FR STAT (SET/KITS/TRAYS/PACK) ×3 IMPLANT
TROCAR ADV FIXATION 5X100MM (TROCAR) ×3 IMPLANT
TUBING CONNECTING 10 (TUBING) ×4 IMPLANT
TUBING CONNECTING 10' (TUBING) ×2
TUBING INSUFFLATION 10FT LAP (TUBING) ×3 IMPLANT

## 2021-05-03 NOTE — Transfer of Care (Signed)
Immediate Anesthesia Transfer of Care Note  Patient: ALISTAR MCENERY  Procedure(s) Performed: XI ROBOTIC ASSISTED LOWER ANTERIOR RESECTION, COLOANAL ANASTAMOSIS, INTRAOPERATIVE ASSESSMENT OF PERFUSION USING FIREFLY, BILATERAL TAP BLOCK DIVERTING LOOP ILEOSTOMY FLEXIBLE SIGMOIDOSCOPY  Patient Location: PACU  Anesthesia Type:General  Level of Consciousness: sedated, patient cooperative and responds to stimulation  Airway & Oxygen Therapy: Patient Spontanous Breathing and Patient connected to face mask oxygen  Post-op Assessment: Report given to RN, Post -op Vital signs reviewed and stable and Patient moving all extremities  Post vital signs: Reviewed and stable  Last Vitals:  Vitals Value Taken Time  BP 134/74 05/03/21 1204  Temp    Pulse 81 05/03/21 1208  Resp 13 05/03/21 1208  SpO2 100 % 05/03/21 1208  Vitals shown include unvalidated device data.  Last Pain:  Vitals:   05/03/21 0554  TempSrc:   PainSc: 0-No pain      Patients Stated Pain Goal: 4 (83/38/25 0539)  Complications: No notable events documented.

## 2021-05-03 NOTE — Consult Note (Signed)
[facility] Nurse ostomy consult note Order received for preoperative stoma site selection.  Patient was marked on 11/28. See note from my associate, M. Austin from that date.  Should a stoma be created intraoperatively, WOC Nursing will see and follow for management and teaching.  Thanks, Maudie Flakes, MSN, RN, [place], Arther Abbott  Pager# [phone]

## 2021-05-03 NOTE — Op Note (Signed)
PATIENT: Robert Hatfield  50 y.o. male  Patient Care Team: Rita Ohara, MD as PCP - General (Family Medicine)  PREOP DIAGNOSIS: RECTAL MASS DESCENDING COLON CANCER  POSTOP DIAGNOSIS: RECTAL MASS DESCENDING COLON CANCER  PROCEDURE:  Robotic assisted ultra-low anterior resection with double stapled colo-anal anastomosis Diagnostic flexible sigmoidoscopy (necessary to delineate distal extent of lesion) Diverting loop ileostomy Intraoperative assessment of perfusion (ICG)  SURGEON: Sharon Mt. Kayo Zion, MD  ASSISTANT: Leighton Ruff MD  ANESTHESIA: General endotracheal  EBL: 100 mL Total I/O In: 2200 [I.V.:2200] Out: 72 [Urine:260; Drains:210; Blood:100]  DRAINS: 25 Fr round blake drain left draining pelvis  SPECIMEN:  Rectosigmoid colon - open end proximal Distal donut - 'final distal margin'  COUNTS: Sponge, needle and instrument counts were reported correct x2  FINDINGS:  Tattoo in proximal sigmoid and rectum. Flexible sigmoidoscopy demonstrates that within the rectum there has been at least partial treatment response.  Extending down to approximately 4-5 cm from verge, there is now islands of neoplasia.  At the location of the more proximal tattoo, there is an apparent small polypoid mass/lesion that is concordant with the endoscopic photos taken by Dr. Henrene Pastor.  This is also at the location of the tattoo.  A low anterior resection is able to be carried out encompassing both of these lesions.  In order to ensure an adequate distal margin, an ultralow anterior resection was utilized.  A double stapled essentially coloanal anastomosis was fashioned about 2 cm above the anal verge.  No obvious metastatic disease on visceral parietal peritoneum or liver.   Elongated mesentery of both small bowel and colon.  A well perfused, tension-free, hemostatic, airtight 29 mm double stapled coloanal anastomosis was fashioned approximately 2 cm from the anal verge.  NARRATIVE: Informed consent  was verified. He was taken to the operating room, placed supine on the operating table and SCD's were applied. General endotracheal anesthesia was induced without difficulty. He was then positioned in the lithotomy position with Allen stirrups.  Pressure points were evaluated and padded.  A foley catheter was then placed by nursing under sterile conditions. Hair on the abdomen was clipped.  He was secured to the operating table. The abdomen was then prepped and draped in the standard sterile fashion. Surgical timeout was called indicating the correct patient, procedure, positioning and need for preoperative antibiotics.   An OG tube was placed by anesthesia and confirmed to be to suction.  At Palmer's point, a stab incision was created and the Veress needle was introduced into the peritoneal cavity on the first attempt.  Intraperitoneal location was confirmed by the aspiration and saline drop test.  Pneumoperitoneum was established to a maximum pressure of 15 mmHg using CO2.  Following this, the abdomen was marked for planned trocar sites.  Just to the right and cephalad to the umbilicus, an 8 mm incision was created and an 8 mm blunt tipped robotic trocar was cautiously placed into the peritoneal cavity.  The laparoscope was inserted and demonstrated no evidence of trocar site nor Veress needle site complications.  The Veress needle was removed.  Bilateral transversus abdominis plane blocks were then created using a dilute mixture of Exparel with Marcaine.  3 additional  robotic trochars were placed under direct visualization roughly in a line extending from the right ASIS towards the left upper quadrant, one of these being the 12 port at/lateral to stoma marking site where distal ileum appears to reach easiest. The bladder was inspected and noted to be at/below the  pubic symphysis.  An additional 5 mm assist port was placed in the right lateral abdomen under direct visualization.  The abdomen was surveyed and  there were some omental adhesions to the paraumbilical midline, otherwise without pertinent findings.  These were carefully taken down sharply.  He was positioned in Trendelenburg with some left side up.  Small bowel was carefully retracted out of the pelvis.  The robot was then docked and I went to the console.   The sigmoid colon was readily identified.  Attachments of the sigmoid colon were taken down from the intersigmoid fossa.  The rectosigmoid colon was grasped and elevated anteriorly. Beginning with a medial to lateral approach, the peritoneum overlying the presacral space was carefully incised.  The TME plane was readily gained working in a plane between the fascia propria of the rectum and the presacral fascia.  Hypogastric nerves were seen going along the the presacral fascia and were protected free of injury.  Working more proximally, the mesorectum and sigmoid mesentery were carefully mobilized off of the retroperitoneum.  The left ureter was identified and protected free of injury.  The left gonadal vessels were identified and protected.  These were both swept "down."  The superior hemorrhoidal and IMA pedicles were identified. Further mesocolon was mobilized proximally staying in this plane between the retroperitoneum proper and the mesocolon. Attention was then turned to the lateral portion of dissection.  The sigmoid colon was retracted to the right.  The sigmoid colon was fully mobilized and working proximally, the descending colon was mobilized by incising the Beth Goodlin line of Toldt.  This was done all the way up to the level of the splenic flexure.  The associated mesocolon was also mobilized medially.  The left ureter again was confirmed to be well away for plan dissection and well away from the vasculature which had been dissected medially.  The rectosigmoid colon was elevated anteriorly. The left ureter was re-identified. The IMA was clear of this. The IMA was then divided with the vessel  sealer. The stump was inspected and noted to be completely hemostatic with a good seal.  The colon was clamped well proximal to the proximal most tattoo.  I went below to perform a flexible sigmoidoscopy.  The flexible sigmoidoscope was inserted into the anal canal.  Under direct visualization, it was advanced all the way up into the proximal descending colon near the level of the splenic flexure where the colon had been occluded.  The scope was then carefully withdrawn.  At the level of the tattoos seen in the sigmoid colon, there is a lesion that appears concordant with the pictures submitted by Dr. Henrene Pastor and his endoscopy report.  More distally, within the rectum, the mass is evident.  There does appear to be a reasonable treatment response.  There are now multiple islands of polypoid tissue as opposed to a concentric cylinder.  This extends down distally into the distal rectum.  It does appear that we will have adequate margin to perform a resection with a double stapled coloanal anastomosis for reconstruction.  I then went back to the console. The mesentery was divided out to the point of planned proximal division, incorporating the IMA pedicle with this division out to the level proximal to the sigmoid tattoo.  Attention was then directed at the mesorectal dissection.  Maintaining the plane between the fascia propria the rectum and the presacral fascia, we continued to develop the plane posteriorly first.  Hypogastric nerves were protected free of injury.  We then completed this dissection out laterally.  This was done all the way down to the level the pelvic floor both posteriorly and laterally.  Finally, we completed our dissection by incising the peritoneal reflection and continuing this TME plane down to just below the level of the prostate.  Under robotic visualization and after occluding the rectosigmoid colon, I passed the flexible sigmoidoscope again from below.  We confirmed an adequate distal  margin where there is normal-appearing rectal mucosa between 1 and 2 cm below the level of the polypoid masses.  A TME dissection had been completed out to this level.  I would back to the console.  The mesorectum was relatively thin at this level given the lower extent of our dissection.  Some of the mesorectum was divided with the vessel sealer at this level.  A robotic 60 mm green load stapler was then used to divide the rectum at this level.  Attention was then directed to performing the intraoperative perfusion assessment test.  ICG was administered by anesthesia.  Under near infrared light, the colon was evaluated out to the level of the proximal point of planned transection.  There is avid uptake of the tracer at this level.  The rectal stump is then inspected and also noted to have avid uptake of tracer.  The pelvis is irrigated and hemostasis verified.  The terminal ileum was inspected.  A few peritoneal attachments to the right pelvic sidewall were carefully freed allowing full mobilization of the distal ileum.   Attention was turned to the extracorporeal portion of the procedure.  The robot was undocked.  I scrubbed back in.  A Pfannenstiel incision was created approximately 3 fingerbreadths above the pubic symphysis.  The rectus fascia was incised and then elevated.  The rectus muscle was mobilized free of the overlying fascia.  The peritoneum was incised in the midline well above the location of the bladder.  An [place] wound protector was placed.  Towels were placed around the field.  The divided colon was passed through the wound protector.  The point of proximal division was identified and was again on a healthy segment of supple colon with a palpable pulse in the mesentery. This was pink in color.  A pursestring device was applied.  A 2-0 Prolene on a Keith needle was passed.  The colon was divided and passed off with the open end being proximal.  EEA sizers were then introduced and a 29 mm EEA  selected.  "Belt loops" consisting of 3-0 silk were placed around the pursestring suture line.  The anvil was placed and the pursestring tied. A small amount of fat was cleared from the planned anastomosis and no diverticula were apparent within this.  The planned ileostomy site is at the location of her 12 mm trocar.  This was removed.  A wheal of skin was excised.  The fascia was incised in a partial cruciate manner.  The distal ileum was then brought out.  Orientation was confirmed such that there is no twisting.  The proximal side is marked for planned maturation in the cephalad position. A cap was placed over the wound protector.  Pneumoperitoneum was reestablished.  I then went below to pass the stapler.  My partner remained above.  EEA sizers were cautiously passed trans-anally under direct visualization.  The stapler was passed and the spike deployed just anterior to the staple line.  The components were then mated.  Orientation was confirmed such that there is no twisting  of the colon nor small bowel underneath the mesenteric defect. Care was taken to ensure no other structures were incorporated within this either.   The stapler was then closed, held, and fired. This was then removed. The donuts were inspected and noted to be complete.  The colon proximal to the anastomosis was then gently occluded. The pelvis was filled with sterile irrigation. Under direct visualization, I passed the flexible sigmoidoscope.  The anastomosis was under water.  With good distention of the anastomosis there is no air leak. The anastomosis is pink in appearance and hemostatic.  This is located at 2 cm from the anal verge by flexible sigmoidoscopy.  It is hemostatic.  Additionally, looking from above, there is no tension on the colon or mesentery.   Sigmoidoscope was withdrawn.  Irrigation was evacuated from the pelvis.  Through our 5 mm assist port, a 19 French round Blake drain was placed into the pelvis.  This was  secured with a 2-0 nylon stitch to the skin.  The abdomen and pelvis are surveyed and noted to be completely hemostatic without any apparent injury.  Under direct visualization, all trochars are removed.  The [place] wound protector was removed.  Gowns/gloves are changed and a fresh set of clean instruments utilized. Additional sterile drapes were placed around the field.   Sponge, needle, and instrument counts were reported correct.  The Pfannenstiel peritoneum was closed with a running 0 Vicryl suture.  The rectus fascia was then closed using 2 running #1 PDS sutures.  The fascia was then palpated and noted to be completely closed.  Additional anesthetic was infiltrated at the Pfannenstiel site.  4-0 Monocryl subcuticular suture was used to close the skin of all incision sites.  Dermabond was placed over all incisions.  A honeycomb dressing placed over the Pfannenstiel as well.  Ileostomy site inspected. Fascial defect is approximately 2 fingerbreadths in diameter. A loop ileostomy was then constructed, brooking the proximal portion. This was done using 3-0 Vicryl suture.  This is pink in appearance.  An ostomy appliance is then cut to fit.  Final counts were reported correct.   He is taken out of lithotomy, awakened from anesthesia, extubated, and transferred to a stretcher for transport to PACU in satisfactory condition having tolerated the procedure well.

## 2021-05-03 NOTE — Anesthesia Procedure Notes (Addendum)
Procedure Name: Intubation Date/Time: 05/03/2021 7:49 AM Performed by: Myna Bright, CRNA Pre-anesthesia Checklist: Emergency Drugs available, Patient identified, Suction available and Patient being monitored Patient Re-evaluated:Patient Re-evaluated prior to induction Oxygen Delivery Method: Circle system utilized Preoxygenation: Pre-oxygenation with 100% oxygen Induction Type: IV induction Ventilation: Mask ventilation without difficulty Laryngoscope Size: Mac and 4 Grade View: Grade III Tube type: Oral Tube size: 7.5 mm Number of attempts: 1 Airway Equipment and Method: Stylet Placement Confirmation: ETT inserted through vocal cords under direct vision, positive ETCO2 and breath sounds checked- equal and bilateral Secured at: 22 cm Tube secured with: Tape Dental Injury: Teeth and Oropharynx as per pre-operative assessment  Comments: Easy mask airway. View of epiglottis only. Advanced ETT without difficulty, +BBS,  +EtCO2. Recommend glidescope in future.

## 2021-05-03 NOTE — Anesthesia Postprocedure Evaluation (Signed)
Anesthesia Post Note  Patient: MOHAB ASHBY  Procedure(s) Performed: XI ROBOTIC ASSISTED LOWER ANTERIOR RESECTION, COLOANAL ANASTAMOSIS, INTRAOPERATIVE ASSESSMENT OF PERFUSION USING FIREFLY, BILATERAL TAP BLOCK DIVERTING LOOP ILEOSTOMY FLEXIBLE SIGMOIDOSCOPY     Patient location during evaluation: PACU Anesthesia Type: General Level of consciousness: awake and alert Pain management: pain level controlled Vital Signs Assessment: post-procedure vital signs reviewed and stable Respiratory status: spontaneous breathing, nonlabored ventilation, respiratory function stable and patient connected to nasal cannula oxygen Cardiovascular status: blood pressure returned to baseline and stable Postop Assessment: no apparent nausea or vomiting Anesthetic complications: no   No notable events documented.  Last Vitals:  Vitals:   05/03/21 1215 05/03/21 1230  BP: 132/80 123/77  Pulse: 72 75  Resp: 12 14  Temp:    SpO2: 97% 96%    Last Pain:  Vitals:   05/03/21 1230  TempSrc:   PainSc: 0-No pain                 Roda Lauture

## 2021-05-03 NOTE — Consult Note (Signed)
[facility] Nurse ostomy consult note Consult received for new loop ileostomy. [facility] Nurse to see tomorrow, Friday, 12/16.   Thanks, Maudie Flakes, MSN, RN, [place], Arther Abbott  Pager# [phone]

## 2021-05-03 NOTE — H&P (Signed)
CC: Here today for surgery  HPI: Robert Hatfield is an 50 y.o. male whom is seen today as an office consultation at the request of Dr. 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   He underwent his first colonoscopy with Dr. Henrene Pastor 11/16/2020. Findings: 1. 8 polyps in the transverse and ascending colon removed Tubular adenomas 2. 2 cm polyp in the sigmoid at 30 cm, removed. Tubular ad.enoma 3. Nonobstructing mass in the descending colon at approximately 40 cm measuring 2.5 x 1.5 cm. This was biopsied and tattooed - appears to be distal and at mass. Biopsy shows adenocarcinoma 4. Large lateral spreading mass in the rectum beginning at 4 cm from the anal verge that is completely circumferential, bulky, but soft. Extends proximal to about 15 cm. Biopsied. Area was tattooed proximally. Superficial fragments of TVA with focal HGD 5. Diverticulosis.  CT chest/abdomen/pelvis 11/27/2020 shows a 7 mm low attenuating lesion in segment 4, too small to characterize. Slight liver steatosis.  MRI pelvis 12/11/2020: Rectal mass cmriT3b/c;N0; 6.4 cm from IAS. No EMVI or levator involvement. ?threatened CRMs on left  Genetics evaluation - CancerNext-Expanded genel panel through [facility] - negative  Repeat flex sig with addn'l biopsies 12/22/20 -demonstrated from the proximal edge of the biopsy focal invasive adenocarcinoma arising in a tubulovillous adenoma with high-grade dysplasia. Lymphovascular invasion is present. Distal rectal tumor biopsy demonstrated tubulovillous adenoma.   Case presented at [facility] with myself and Drs. Sher[place] and [place] present. Given invasive adenocarcinoma findings, LVI, and additionally an MRI showing lesion to be cmriT3b/c proceeding with at least up front cXRT. This would provide local treatment to the rectal cancer which upfront would be more beneficial than after surgery particularly with suggestion of threatened CRMs on MRI; additionally from functional  standpoint would be ideal than radiating his colorectal anastomosis and neorectal conduit; subsequently surgery which would involve most likely a left hemicolectomy with low anterior resection, diverting loop ileostomy, possible takedown of splenic flexure.  Started cXRT 01/08/21, planned completion date ~02/14/21  He has been doing great. No real symptoms/side effects as of yet aside from some mildly looser stool. No complaints today.  PMH: Denies  PSH: Inguinal hernia repair as infant; multiple orthopedic surgeries  FHx: Mother had lung cancer in her 42s; father had colon cancer; paternal grandmother had both colon and breast cancer  Social Hx: Denies use of tobacco/etoh; works as a Clinical biochemist  Review of Systems: A complete review of systems was obtained from the patient. I have reviewed this information and discussed as appropriate with the patient. See HPI as well for other ROS.  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 Nelva Bush)   Cancer [facility])    colorectal   Depression 2000-2002, 2015   treated with Zoloft and Klonopin x 1-2 yrs; recurrent in 2015   Family history of breast cancer 12/18/2020   Family history of colon cancer 12/18/2020   Family history of colonic polyps 12/18/2020   Family history of lung cancer 12/18/2020   Family history of prostate cancer 12/18/2020   Neuromuscular disorder ([place])    bil feet   Personal history of colonic polyps 12/18/2020   Pneumonia    as a child   Pure  hyperglyceridemia     Past Surgical History:  Procedure Laterality Date   COLONOSCOPY     DEBRIDEMENT TENNIS ELBOW Right 2000   HUMERUS FRACTURE SURGERY Right    age 22   KNEE ARTHROSCOPY Left    age [age]   KNEE ARTHROSCOPY Right    age [age]   SIGMOIDOSCOPY  12/22/2020   TONSILLECTOMY  child     Family History  Problem Relation Age of Onset   Depression Mother    Lung cancer Mother        smoking hx   Alcoholism Mother    Colon cancer Father        dx after 16   Colon polyps Father    Hypertension Father    Prostate cancer Father        dx 61s   AAA (abdominal aortic aneurysm) Father        repaired   Kidney disease Father        s/p AAA repair; on dialysis   Lung cancer Father        dx after 38; smoking hx   Colon polyps Sister 94       unknown number   Depression Brother    Colon polyps Brother        unknown number   Lung cancer Paternal Uncle        smoking hx   Colon cancer Paternal Grandmother        dx after 63   Breast cancer Paternal Grandmother        dx after 74   Diabetes Neg Hx    Heart disease Neg Hx    Stomach cancer Neg Hx    Rectal cancer Neg Hx     Social:  reports that he quit smoking about 17 years ago. His smoking use included cigarettes. He has never used smokeless tobacco. He reports that he does not currently use alcohol. He reports that he does not currently use drugs after having used the following drugs: Marijuana. Frequency: 2.00 times per week.  Allergies: No Known Allergies  Medications: I have reviewed the patient's current medications.  No results found for this or any previous visit (from the past 48 hour(s)).  No results found.  ROS - all of the below systems have been reviewed with the patient and positives are indicated with bold text General: chills, fever or night sweats Eyes: blurry vision or double vision ENT: epistaxis or sore throat Allergy/Immunology: itchy/watery eyes or nasal congestion Hematologic/Lymphatic: bleeding problems, blood clots or swollen lymph nodes Endocrine: temperature intolerance or unexpected weight changes Breast: new or changing breast lumps or nipple discharge Resp: cough, shortness of breath, or wheezing CV: chest pain or dyspnea on exertion GI: as per HPI GU: dysuria, trouble  voiding, or hematuria MSK: joint pain or joint stiffness Neuro: TIA or stroke symptoms Derm: pruritus and skin lesion changes Psych: anxiety and depression  PE Blood pressure 117/81, pulse 69, temperature 97.7 F (36.5 C), temperature source Oral, resp. rate 18, height 6' (1.829 m), weight 82.6 kg, SpO2 100 %. Constitutional: NAD; conversant Eyes: Moist conjunctiva; no lid lag; anicteric Neck: Trachea midline; no thyromegaly Lungs: Normal respiratory effort CV: RRR GI: Abd soft, NT/ND MSK: Normal range of motion of extremities Psychiatric: Appropriate affect; alert and oriented x3  No results found for this or any previous visit (from the past 48 hour(s)).  No results found.   A/P: Robert Hatfield is an 50 y.o. male with with newly  diagnosed carpeting polypoid mass of the rectum with invasive adenocarcinoma on repeat biopsies + LVI (cmriT3b/cN0); descending colon cancer  -Genetics panel negative -Completion of neoadjuvant cXRT  -The anatomy and physiology of the GI tract was reviewed with the patient. The pathophysiology of colorectal cancer was discussed as well with associated pictures. -We have discussed various different treatment options going forward including surgery (the most definitive) to address this - robotic-assisted low anterior resection with en bloc removal of the involved sigmoid/descending colon to address both areas (left hemicolectomy); diverting loop ileostomy, flexible sigmoidoscopy, possible takedown of splenic flexure. -The planned procedure, material risks (including, but not limited to, pain, bleeding, infection, scarring, need for blood transfusion, damage to surrounding structures- blood vessels/nerves/viscus/organs, damage to ureter, urine leak, leak from anastomosis, need for additional procedures, sexual dysfunction, scenarios where a stoma may be necessary and where it may be permanent, worsening of pre-existing medical conditions, hernia, recurrence of  cancer despite all mentioned treatment, pneumonia, heart attack, stroke, death) benefits and alternatives to surgery were discussed at length. The patient's questions were answered to his satisfaction, he voiced understanding and elected to proceed with surgery. Additionally, we discussed typical postoperative expectations and the recovery process. -We spent time reviewing the potential quality of life and functional related issues following these types of procedures as well   Nadeen Landau, MD [facility] Surgery Use [email or web address] to contact on call provider

## 2021-05-04 ENCOUNTER — Encounter (HOSPITAL_COMMUNITY): Payer: Self-pay | Admitting: Surgery

## 2021-05-04 LAB — BASIC METABOLIC PANEL
Anion gap: 7 (ref 5–15)
BUN: 8 mg/dL (ref 6–20)
CO2: 26 mmol/L (ref 22–32)
Calcium: 8.5 mg/dL — ABNORMAL LOW (ref 8.9–10.3)
Chloride: 104 mmol/L (ref 98–111)
Creatinine, Ser: 0.74 mg/dL (ref 0.61–1.24)
GFR, Estimated: >60 mL/min (ref >60)
Glucose, Bld: 132 mg/dL — ABNORMAL HIGH (ref 70–99)
Potassium: 4 mmol/L (ref 3.5–5.1)
Sodium: 137 mmol/L (ref 135–145)

## 2021-05-04 LAB — CBC
HCT: 38.1 % — ABNORMAL LOW (ref 39.0–52.0)
Hemoglobin: 12.7 g/dL — ABNORMAL LOW (ref 13.0–17.0)
MCH: 31.6 pg (ref 26.0–34.0)
MCHC: 33.3 g/dL (ref 30.0–36.0)
MCV: 94.8 fL (ref 80.0–100.0)
Platelets: 244 10*3/uL (ref 150–400)
RBC: 4.02 MIL/uL — ABNORMAL LOW (ref 4.22–5.81)
RDW: 13.4 % (ref 11.5–15.5)
WBC: 8.6 10*3/uL (ref 4.0–10.5)
nRBC: 0 % (ref 0.0–0.2)

## 2021-05-04 MED ORDER — PHENOL 1.4 % MT LIQD: 1.0000 | OROMUCOSAL | Status: DC | PRN

## 2021-05-04 MED ORDER — TRAMADOL HCL 50 MG PO TABS
50.0000 mg | ORAL_TABLET | Freq: Four times a day (QID) | ORAL | Status: DC | PRN
  Administered 2021-05-04 – 2021-05-07 (×9): 50 mg via ORAL
  Filled 2021-05-04 (×9): qty 1

## 2021-05-04 MED ORDER — TRAMADOL HCL 50 MG PO TABS: 50.0000 mg | ORAL_TABLET | Freq: Four times a day (QID) | ORAL | 0 refills | Status: AC | PRN

## 2021-05-04 MED ORDER — PSYLLIUM 95 % PO PACK
1.0000 | PACK | Freq: Two times a day (BID) | ORAL | Status: DC
Start: 2021-05-04 — End: 2021-05-07
  Administered 2021-05-04 – 2021-05-07 (×6): 1 via ORAL
  Filled 2021-05-04 (×8): qty 1

## 2021-05-04 MED ORDER — HYDROMORPHONE HCL 1 MG/ML IJ SOLN
0.5000 mg | INTRAMUSCULAR | Status: DC | PRN
  Administered 2021-05-04 – 2021-05-07 (×14): 0.5 mg via INTRAVENOUS
  Filled 2021-05-04 (×15): qty 0.5

## 2021-05-04 MED ORDER — IBUPROFEN 400 MG PO TABS
600.0000 mg | ORAL_TABLET | Freq: Four times a day (QID) | ORAL | Status: DC | PRN
  Administered 2021-05-04 – 2021-05-05 (×3): 600 mg via ORAL
  Filled 2021-05-04 (×3): qty 1

## 2021-05-04 NOTE — Consult Note (Addendum)
[place] Nurse ostomy consult note Stoma type/location: LLQ loop ileostomy (Dr. Dema Severin)  POD 1 Stomal assessment/size: 1 and 1/4 inch round, red, moist, slightly budded Peristomal assessment: intact.  Small area of elevation/possible parastomal hernia noted at 12 o'clock Treatment options for stomal/peristomal skin: skin barrier ring Output : 32mls of dark brown effluent Ostomy pouching: 2pc. 2 and 1/4 inch ostomy pouching system with skin barrier ring Education provided:  Explained role of ostomy nurse and creation of stoma  Explained stoma characteristics (budded, flush, color, texture, care) Demonstrated pouch change (cutting new skin barrier, measuring stoma, cleaning peristomal skin and stoma, use of barrier ring). Patient can perform Lock and Roll closure independently. Education on emptying when 1/3 to 1/2 full and how to empty Demonstrated use of wick to clean spout  Discussed bathing, diet, gas, medication use, constipation, diarrhea, dehydration  Discussed food blockage and the importance of chewing food well and consuming plenty of fluids.  Discussed risk of peristomal hernia  Answered patient questions regarding change frequency, emptying frequency, that he is able to shower, instructed to empty pouch when he uses the bathroom to empty bladder, change pouching system in the morning before eating or drinking, purpose and role of Secure Start, rationale for not lifting (hernia). Patient to practice emptying pouch prior to discharge.  Patient provided with ostomy nurse contact number in the event of questions of issues during the immediate post acute phase.   Enrolled patient in [place] program: Yes, today. Requested 4 opaque pouches, 4 skin barriers and 4 skin barrier rings, also 2 closed end pouches for exercise, intimacy.  Supplies prepared for discharge: 6 skin barriers, 6 pouches, 6 skin barrier rings.   [place] nursing team will follow, and will remain available to this  patient, the nursing and medical teams.   Thanks, Maudie Flakes, MSN, RN, [place], Arther Abbott  Pager# [phone]

## 2021-05-04 NOTE — Progress Notes (Addendum)
Subjective No acute events. Intermittent soreness, only getting dilaudid pushes currently in addition to tylenol. Some nausea, had emesis yesterday but none since. On full liquids and ostomy has began to be productive overnight.  Objective: Vital signs in last 24 hours: Temp:  [97.4 F (36.3 C)-100 F (37.8 C)] 98.9 F (37.2 C) (12/16 0520) Pulse Rate:  [66-96] 79 (12/16 0520) Resp:  [10-18] 18 (12/16 0520) BP: (91-141)/(64-89) 91/66 (12/16 0520) SpO2:  [96 %-100 %] 97 % (12/16 0520) Weight:  [85.4 kg] 85.4 kg (12/16 0500) Last BM Date: 05/03/21  Intake/Output from previous day: 12/15 0701 - 12/16 0700 In: 2535.4 [I.V.:2535.4] Out: 1425 [Urine:635; Drains:415; Stool:275; Blood:100] Intake/Output this shift: No intake/output data recorded.  Gen: NAD, comfortable CV: RRR Pulm: Normal work of breathing Abd: Soft, appropriate incisional tenderness, not significantly distended. No rebound/guarding. Incisions c/d. Drain ss. Ileostomy pink with thin green bilious fluid in appliance Ext: SCDs in place  Lab Results: CBC  Recent Labs    05/04/21 0441  WBC 8.6  HGB 12.7*  HCT 38.1*  PLT 244   BMET Recent Labs    05/04/21 0441  NA 137  K 4.0  CL 104  CO2 26  GLUCOSE 132*  BUN 8  CREATININE 0.74  CALCIUM 8.5*   PT/INR No results for input(s): LABPROT, INR in the last 72 hours. ABG No results for input(s): PHART, HCO3 in the last 72 hours.  Invalid input(s): PCO2, PO2  Studies/Results:  Anti-infectives: Anti-infectives (From admission, onward)    Start     Dose/Rate Route Frequency Ordered Stop   05/03/21 1400  neomycin (MYCIFRADIN) tablet 1,000 mg  Status:  Discontinued       See Hyperspace for full Linked Orders Report.   1,000 mg Oral 3 times per day 05/03/21 0528 05/03/21 0531   05/03/21 1400  metroNIDAZOLE (FLAGYL) tablet 1,000 mg  Status:  Discontinued       See Hyperspace for full Linked Orders Report.   1,000 mg Oral 3 times per day 05/03/21 0528  05/03/21 0531   05/03/21 0600  cefoTEtan (CEFOTAN) 2 g in sodium chloride 0.9 % 100 mL IVPB        2 g 200 mL/hr over 30 Minutes Intravenous On call to O.R. 05/03/21 0528 05/03/21 0755        Assessment/Plan: Patient Active Problem List   Diagnosis Date Noted   Rectal cancer ([place]) 05/03/2021   Genetic testing 12/29/2020   Adenocarcinoma of rectum ([place]) 12/21/2020   Family history of colon cancer 12/18/2020   Personal history of colonic polyps 12/18/2020   Family history of colonic polyps 12/18/2020   Family history of breast cancer 12/18/2020   Family history of prostate cancer 12/18/2020   Family history of lung cancer 12/18/2020   Adenocarcinoma of colon ([place]) 11/22/2020   B12 deficiency 11/22/2020   Vitamin D deficiency 11/22/2020   Ventral hernia without obstruction or gangrene 16/60/6301   Umbilical hernia without obstruction or gangrene 05/30/2014   Depressive disorder, not elsewhere classified 12/15/2013   Anxiety state, unspecified 04/09/2012   Bulging discs 06/17/2011   Cervicalgia 06/17/2011   s/p Procedure(s): XI ROBOTIC ASSISTED LOWER ANTERIOR RESECTION, COLOANAL ANASTAMOSIS, INTRAOPERATIVE ASSESSMENT OF PERFUSION USING FIREFLY, BILATERAL TAP BLOCK DIVERTING LOOP ILEOSTOMY FLEXIBLE SIGMOIDOSCOPY 05/03/2021  -He is doing quite well -Will start BID metamucil -We spent time reviewing his procedure, findings, and plans moving forward. We have reviewed ileostomy expectations with regards to output and character. Toothpaste consistency, volume <1.2L/24hrs once on solid foods -  D/C Entereg today -Ambulate 5x/day -Continue MIVF, monitor ileostomy output -JP to bulb suction - likely remove at discharge if remains low volume/reassuring character -WOCN to see for new ileostomy teaching; will place referral to WOCN for outpatient follow-up as well -Ppx: SQH, SCDs   LOS: 1 day   Nadeen Landau, MD [facility] Surgery Use [email or web address] to contact on call  provider

## 2021-05-04 NOTE — TOC Initial Note (Signed)
Transition of Care [facility]) - Initial/Assessment Note    Patient Details  Name: Robert Hatfield MRN: [id number] Date of Birth: 12/06/1970  Transition of Care [facility]) CM/SW Contact:    Robert Mage, LCSW Phone Number: 05/04/2021, 1:35 PM  Clinical Narrative:  Patient seen in follow up to MD consult for [facility] RN services.  Robert Hatfield lives here in [place] with his wife.  [place] like he has good support. He will be trained in stoma care by wound care nurse here later today, and she indicated in our conversation that he would be set up with ostomy clinic at some point by [facility].  In the meantime, Robert Hatfield with [facility] confirms that they will be able to provide [facility] services for short time, starting on Monday or Tuesday of next week.  TOC will continue to follow during the course of hospitalization.                Expected Discharge Plan: [place] Barriers to Discharge: No Barriers Identified   Patient Goals and CMS Choice     Choice offered to / list presented to : Patient  Expected Discharge Plan and Services Expected Discharge Plan: [place]   Discharge Planning Services: CM Consult Post Acute Care Choice: [place] arrangements for the past 2 months: Single Family Home                                      Prior Living Arrangements/Services Living arrangements for the past 2 months: Single Family Home Lives with:: Spouse, Minor Children Patient language and need for interpreter reviewed:: Yes        Need for Family Participation in Patient Care: Yes (Comment) Care giver support system in place?: Yes (comment)   Criminal Activity/Legal Involvement Pertinent to Current Situation/Hospitalization: No - Comment as needed  Activities of Daily Living Home Assistive Devices/Equipment: Contact lenses, Eyeglasses ADL Screening (condition at time of admission) Patient's cognitive ability adequate to safely complete daily  activities?: Yes Is the patient deaf or have difficulty hearing?: No Does the patient have difficulty seeing, even when wearing glasses/contacts?: Yes Does the patient have difficulty concentrating, remembering, or making decisions?: No Patient able to express need for assistance with ADLs?: No Does the patient have difficulty dressing or bathing?: No Independently performs ADLs?: Yes (appropriate for developmental age) Does the patient have difficulty walking or climbing stairs?: No Weakness of Legs: Both Weakness of Arms/Hands: None  Permission Sought/Granted                  Emotional Assessment Appearance:: Appears stated age Attitude/Demeanor/Rapport: Engaged Affect (typically observed): Appropriate Orientation: : Oriented to Self, Oriented to Place, Oriented to  Time, Oriented to Situation Alcohol / Substance Use: Not Applicable Psych Involvement: No (comment)  Admission diagnosis:  Rectal cancer ([place]) [C20] Patient Active Problem List   Diagnosis Date Noted   Rectal cancer ([place]) 05/03/2021   Genetic testing 12/29/2020   Adenocarcinoma of rectum ([place]) 12/21/2020   Family history of colon cancer 12/18/2020   Personal history of colonic polyps 12/18/2020   Family history of colonic polyps 12/18/2020   Family history of breast cancer 12/18/2020   Family history of prostate cancer 12/18/2020   Family history of lung cancer 12/18/2020   Adenocarcinoma of colon ([place]) 11/22/2020   B12 deficiency 11/22/2020   Vitamin D deficiency 11/22/2020  Ventral hernia without obstruction or gangrene 87/68/1157   Umbilical hernia without obstruction or gangrene 05/30/2014   Depressive disorder, not elsewhere classified 12/15/2013   Anxiety state, unspecified 04/09/2012   Bulging discs 06/17/2011   Cervicalgia 06/17/2011   PCP:  Rita Ohara, MD Pharmacy:   [place], [place]  [address] Phone: [phone] Fax: [place] [address] Phone: [phone] Fax: [phone]     Social Determinants of Health (SDOH) Interventions    Readmission Risk Interventions No flowsheet data found.

## 2021-05-04 NOTE — Discharge Instructions (Signed)
POST OP INSTRUCTIONS AFTER COLON SURGERY  DIET: Be sure to include lots of fluids daily to stay hydrated - 64oz of water per day (8, 8 oz glasses).  Avoid fast food or heavy meals for the first couple of weeks as your are more likely to get nauseated. Avoid raw/uncooked fruits or vegetables for the first 4 weeks (its ok to have these if they are blended into smoothie form). If you have fruits/vegetables, make sure they are cooked until soft enough to mash on the roof of your mouth and chew your food well. Otherwise, diet as tolerated.  Take your usually prescribed home medications unless otherwise directed.  PAIN CONTROL: Pain is best controlled by a usual combination of three different methods TOGETHER: Ice/Heat Over the counter pain medication Prescription pain medication Most patients will experience some swelling and bruising around the surgical site.  Ice packs or heating pads (30-60 minutes up to 6 times a day) will help. Some people prefer to use ice alone, heat alone, alternating between ice & heat.  Experiment to what works for you.  Swelling and bruising can take several weeks to resolve.   It is helpful to take an over-the-counter pain medication regularly for the first few weeks: Ibuprofen (Motrin/Advil) - 200mg  tabs - take 3 tabs (600mg ) every 6 hours as needed for pain (unless you have been directed previously to avoid NSAIDs/ibuprofen) Acetaminophen (Tylenol) - you may take 650mg  every 6 hours as needed. You can take this with motrin as they act differently on the body. If you are taking a narcotic pain medication that has acetaminophen in it, do not take over the counter tylenol at the same time. NOTE: You may take both of these medications together - most patients  find it most helpful when alternating between the two (i.e. Ibuprofen at 6am, tylenol at 9am, ibuprofen at 12pm ..[place]) A  prescription for pain medication should be given to you upon discharge.  Take your pain medication as  prescribed if your pain is not adequatly controlled with the over-the-counter pain reliefs mentioned above.  Ileostomy: Continue to monitor output daily (totals <1.2L in 24hrs is the goal). Consistency should be ~toothpaste in consistency. Appliance care as instructed with our wound ostomy teach. Generally, patients will change everything 2-3x/week or as necessary. It is ok to bathe normally and get the ostomy wet etc. Concentrated sweets may make the output higher, monitor what foods work best for you. We generally recommend a twice daily dose of fiber (metamucil, benefiber, etc) to help keep the output on the thicker side as well. If the output is >1.2L in 24 hrs, please let us know. We generally start Imodium - 1 tab 2x daily to begin and gradually increase the dose to titrate the output to <1.2 L. Keep a log of your output and bring this to your follow-up visit.  Dressing: Your incisions are covered in Dermabond which is like sterile superglue for the skin. This will come off on it's own in a couple weeks. It is waterproof and you may bathe normally starting the day after your surgery in a shower. Avoid baths/pools/lakes/oceans until your wounds have fully healed. If you were discharged with a drain, empty and record as instructed and keep a log of the daily volume and character of the liquid (clear, koolaid, etc).  ACTIVITIES as tolerated:   Avoid heavy lifting (>10lbs or 1 gallon of milk) for the next 6 weeks. You may resume regular daily activities as tolerated--such as daily  self-care, walking, climbing stairs--gradually increasing activities as tolerated.  If you can walk 30 minutes without difficulty, it is safe to try more intense activity such as jogging, treadmill, bicycling, low-impact aerobics.  DO NOT PUSH THROUGH PAIN.  Let pain be your guide: If it hurts to do something, don't do it. You may drive when you are no longer taking prescription pain medication, you can comfortably wear a  seatbelt, and you can safely maneuver your car and apply brakes.  FOLLOW UP in our office Please call [facility] at [phone] to set up an appointment to see your surgeon in the office for a follow-up appointment approximately 2 weeks after your surgery. Make sure that you call for this appointment the day you arrive home to insure a convenient appointment time.  9. If you have disability or family leave forms that need to be completed, you may have them completed by your primary care physician's office; for return to work instructions, please ask our office staff and they will be happy to assist you in obtaining this documentation   When to call us [phone]: Poor pain control Reactions / problems with new medications (rash/itching, etc)  Fever over 101.5 F (38.5 C) Inability to urinate Nausea/vomiting Worsening swelling or bruising Continued bleeding from incision. Increased pain, redness, or drainage from the incision  The clinic staff is available to answer your questions during regular business hours (8:30am-5pm).  Please dont hesitate to call and ask to speak to one of our nurses for clinical concerns.   A surgeon from [facility] is always on call at the hospitals   If you have a medical emergency, go to the nearest emergency room or call 911.  [facility], [place] [address], [place], [place], [place]  [address] MAIN: [phone] FAX: [phone] [email or web address]

## 2021-05-05 LAB — BASIC METABOLIC PANEL
Anion gap: 7 (ref 5–15)
BUN: 11 mg/dL (ref 6–20)
CO2: 30 mmol/L (ref 22–32)
Calcium: 8.7 mg/dL — ABNORMAL LOW (ref 8.9–10.3)
Chloride: 102 mmol/L (ref 98–111)
Creatinine, Ser: 0.79 mg/dL (ref 0.61–1.24)
GFR, Estimated: >60 mL/min (ref >60)
Glucose, Bld: 93 mg/dL (ref 70–99)
Potassium: 3.8 mmol/L (ref 3.5–5.1)
Sodium: 139 mmol/L (ref 135–145)

## 2021-05-05 LAB — CBC
HCT: 40.1 % (ref 39.0–52.0)
Hemoglobin: 13 g/dL (ref 13.0–17.0)
MCH: 31.8 pg (ref 26.0–34.0)
MCHC: 32.4 g/dL (ref 30.0–36.0)
MCV: 98 fL (ref 80.0–100.0)
Platelets: 227 10*3/uL (ref 150–400)
RBC: 4.09 MIL/uL — ABNORMAL LOW (ref 4.22–5.81)
RDW: 13.6 % (ref 11.5–15.5)
WBC: 5.4 10*3/uL (ref 4.0–10.5)
nRBC: 0 % (ref 0.0–0.2)

## 2021-05-05 NOTE — Progress Notes (Signed)
2 Days Post-Op   Subjective/Chief Complaint: Had a bad night. Feels better this am   Objective: Vital signs in last 24 hours: Temp:  [98.2 F (36.8 C)-98.5 F (36.9 C)] 98.5 F (36.9 C) (12/17 0628) Pulse Rate:  [58-66] 60 (12/17 0628) Resp:  [18] 18 (12/17 0628) BP: (100-135)/(64-72) 135/72 (12/17 0628) SpO2:  [96 %-100 %] 100 % (12/17 0628) Weight:  [85.4 kg] 85.4 kg (12/17 0628) Last BM Date: 05/04/21  Intake/Output from previous day: 12/16 0701 - 12/17 0700 In: 2683.3 [P.O.:480; I.V.:2203.3] Out: 2390 [Urine:1375; Drains:90; Stool:925] Intake/Output this shift: Total I/O In: -  Out: 350 [Stool:350]  General appearance: alert and cooperative Resp: clear to auscultation bilaterally Cardio: regular rate and rhythm GI: soft, appropriately tender. Ostomy pink and productive. Incisions ok  Lab Results:  Recent Labs    05/04/21 0441 05/05/21 0454  WBC 8.6 5.4  HGB 12.7* 13.0  HCT 38.1* 40.1  PLT 244 227   BMET Recent Labs    05/04/21 0441 05/05/21 0454  NA 137 139  K 4.0 3.8  CL 104 102  CO2 26 30  GLUCOSE 132* 93  BUN 8 11  CREATININE 0.74 0.79  CALCIUM 8.5* 8.7*   PT/INR No results for input(s): LABPROT, INR in the last 72 hours. ABG No results for input(s): PHART, HCO3 in the last 72 hours.  Invalid input(s): PCO2, PO2  Studies/Results: No results found.  Anti-infectives: Anti-infectives (From admission, onward)    Start     Dose/Rate Route Frequency Ordered Stop   05/03/21 1400  neomycin (MYCIFRADIN) tablet 1,000 mg  Status:  Discontinued       See Hyperspace for full Linked Orders Report.   1,000 mg Oral 3 times per day 05/03/21 0528 05/03/21 0531   05/03/21 1400  metroNIDAZOLE (FLAGYL) tablet 1,000 mg  Status:  Discontinued       See Hyperspace for full Linked Orders Report.   1,000 mg Oral 3 times per day 05/03/21 0528 05/03/21 0531   05/03/21 0600  cefoTEtan (CEFOTAN) 2 g in sodium chloride 0.9 % 100 mL IVPB        2 g 200 mL/hr over  30 Minutes Intravenous On call to O.R. 05/03/21 0528 05/03/21 0755       Assessment/Plan: s/p Procedure(s): XI ROBOTIC ASSISTED LOWER ANTERIOR RESECTION, COLOANAL ANASTAMOSIS, INTRAOPERATIVE ASSESSMENT OF PERFUSION USING FIREFLY, BILATERAL TAP BLOCK (N/A) DIVERTING LOOP ILEOSTOMY (N/A) FLEXIBLE SIGMOIDOSCOPY (N/A) Advance diet as tolerated -Will start BID metamucil -We spent time reviewing his procedure, findings, and plans moving forward. We have reviewed ileostomy expectations with regards to output and character. Toothpaste consistency, volume <1.2L/24hrs once on solid foods -D/C Entereg today -Ambulate 5x/day -Continue MIVF, monitor ileostomy output -JP to bulb suction - likely remove at discharge if remains low volume/reassuring character -WOCN to see for new ileostomy teaching; will place referral to [facility] for outpatient follow-up as well -Ppx: SQH, SCDs  LOS: 2 days    Braelon Sprung III 05/05/2021

## 2021-05-05 NOTE — Progress Notes (Signed)
Educated patient and wife, Jinny Blossom, on changing ostomy bag. Patient's 2 piece ostomy bag was changed due to stool leaking from the under side of the bag.

## 2021-05-06 LAB — BASIC METABOLIC PANEL
Anion gap: 7 (ref 5–15)
BUN: 9 mg/dL (ref 6–20)
CO2: 27 mmol/L (ref 22–32)
Calcium: 8.3 mg/dL — ABNORMAL LOW (ref 8.9–10.3)
Chloride: 100 mmol/L (ref 98–111)
Creatinine, Ser: 0.83 mg/dL (ref 0.61–1.24)
GFR, Estimated: >60 mL/min (ref >60)
Glucose, Bld: 99 mg/dL (ref 70–99)
Potassium: 4.1 mmol/L (ref 3.5–5.1)
Sodium: 134 mmol/L — ABNORMAL LOW (ref 135–145)

## 2021-05-06 MED ORDER — LOPERAMIDE HCL 2 MG PO CAPS
2.0000 mg | ORAL_CAPSULE | Freq: Three times a day (TID) | ORAL | Status: DC
  Administered 2021-05-07 (×2): 2 mg via ORAL
  Filled 2021-05-06 (×3): qty 1

## 2021-05-06 NOTE — Progress Notes (Signed)
RN educated patient on how to empty ostomy pouch - patient demonstrated ability to open, empty, clean and reseal ostomy with minimal assistance from RN. Will continue to assist and encourage patient as he learns and practies how to manage his new ostomy.

## 2021-05-06 NOTE — Progress Notes (Signed)
3 Days Post-Op   Subjective/Chief Complaint: Feels a little better today. Still having some pain   Objective: Vital signs in last 24 hours: Temp:  [97.4 F (36.3 C)-98 F (36.7 C)] 98 F (36.7 C) (12/18 0624) Pulse Rate:  [73-80] 73 (12/18 0624) Resp:  [16-18] 18 (12/18 0624) BP: (105-130)/(74-85) 119/74 (12/18 0624) SpO2:  [96 %-98 %] 96 % (12/18 0624) Weight:  [82.3 kg] 82.3 kg (12/18 0500) Last BM Date: 05/03/21 (per rectum)  Intake/Output from previous day: 12/17 0701 - 12/18 0700 In: 1360.1 [P.O.:960; I.V.:400.1] Out: 3060 [Urine:650; Drains:235; Stool:2175] Intake/Output this shift: No intake/output data recorded.  General appearance: alert and cooperative Resp: clear to auscultation bilaterally Cardio: regular rate and rhythm GI: soft, appropriately tender. Incisions look good. Ostomy pink and productive  Lab Results:  Recent Labs    05/04/21 0441 05/05/21 0454  WBC 8.6 5.4  HGB 12.7* 13.0  HCT 38.1* 40.1  PLT 244 227   BMET Recent Labs    05/05/21 0454 05/06/21 0443  NA 139 134*  K 3.8 4.1  CL 102 100  CO2 30 27  GLUCOSE 93 99  BUN 11 9  CREATININE 0.79 0.83  CALCIUM 8.7* 8.3*   PT/INR No results for input(s): LABPROT, INR in the last 72 hours. ABG No results for input(s): PHART, HCO3 in the last 72 hours.  Invalid input(s): PCO2, PO2  Studies/Results: No results found.  Anti-infectives: Anti-infectives (From admission, onward)    Start     Dose/Rate Route Frequency Ordered Stop   05/03/21 1400  neomycin (MYCIFRADIN) tablet 1,000 mg  Status:  Discontinued       See Hyperspace for full Linked Orders Report.   1,000 mg Oral 3 times per day 05/03/21 0528 05/03/21 0531   05/03/21 1400  metroNIDAZOLE (FLAGYL) tablet 1,000 mg  Status:  Discontinued       See Hyperspace for full Linked Orders Report.   1,000 mg Oral 3 times per day 05/03/21 0528 05/03/21 0531   05/03/21 0600  cefoTEtan (CEFOTAN) 2 g in sodium chloride 0.9 % 100 mL IVPB         2 g 200 mL/hr over 30 Minutes Intravenous On call to O.R. 05/03/21 0528 05/03/21 0755       Assessment/Plan: s/p Procedure(s): XI ROBOTIC ASSISTED LOWER ANTERIOR RESECTION, COLOANAL ANASTAMOSIS, INTRAOPERATIVE ASSESSMENT OF PERFUSION USING FIREFLY, BILATERAL TAP BLOCK (N/A) DIVERTING LOOP ILEOSTOMY (N/A) FLEXIBLE SIGMOIDOSCOPY (N/A) Advance diet If he continues to do well then may be ready for d/c tomorrow -Will start BID metamucil -We spent time reviewing his procedure, findings, and plans moving forward. We have reviewed ileostomy expectations with regards to output and character. Toothpaste consistency, volume <1.2L/24hrs once on solid foods -D/C Entereg today -Ambulate 5x/day -Continue MIVF, monitor ileostomy output -JP to bulb suction - likely remove at discharge if remains low volume/reassuring character -WOCN to see for new ileostomy teaching; will place referral to [facility] for outpatient follow-up as well -Ppx: SQH, SCDs  LOS: 3 days    Robert Hatfield 05/06/2021

## 2021-05-07 LAB — BASIC METABOLIC PANEL
Anion gap: 8 (ref 5–15)
BUN: 9 mg/dL (ref 6–20)
CO2: 26 mmol/L (ref 22–32)
Calcium: 8.7 mg/dL — ABNORMAL LOW (ref 8.9–10.3)
Chloride: 102 mmol/L (ref 98–111)
Creatinine, Ser: 0.74 mg/dL (ref 0.61–1.24)
GFR, Estimated: >60 mL/min (ref >60)
Glucose, Bld: 92 mg/dL (ref 70–99)
Potassium: 3.6 mmol/L (ref 3.5–5.1)
Sodium: 136 mmol/L (ref 135–145)

## 2021-05-07 MED ORDER — TAMSULOSIN HCL 0.4 MG PO CAPS: 0.4000 mg | ORAL_CAPSULE | Freq: Every day | ORAL | 0 refills | Status: DC

## 2021-05-07 MED ORDER — TAMSULOSIN HCL 0.4 MG PO CAPS
0.4000 mg | ORAL_CAPSULE | Freq: Every day | ORAL | Status: DC
  Administered 2021-05-07: 10:00:00 0.4 mg via ORAL
  Filled 2021-05-07: qty 1

## 2021-05-07 MED ORDER — LOPERAMIDE HCL 2 MG PO CAPS: 2.0000 mg | ORAL_CAPSULE | Freq: Two times a day (BID) | ORAL | 1 refills | Status: DC

## 2021-05-07 NOTE — Progress Notes (Signed)
4 Days Post-Op   Subjective/Chief Complaint: Feeling well, doing well. Pain controlled. Tolerating diet.    Objective: Vital signs in last 24 hours: Temp:  [98.6 F (37 C)-98.9 F (37.2 C)] 98.7 F (37.1 C) (12/19 0526) Pulse Rate:  [69-70] 70 (12/19 0526) Resp:  [14-18] 18 (12/19 0526) BP: (121-131)/(70-78) 129/78 (12/19 0526) SpO2:  [97 %-98 %] 97 % (12/19 0526) Weight:  [82 kg] 82 kg (12/19 0526) Last BM Date: 05/06/21  Intake/Output from previous day: 12/18 0701 - 12/19 0700 In: 2444.6 [P.O.:990; I.V.:1454.6] Out: 2235 [Urine:300; Drains:85; XLKGM:0102] Intake/Output this shift: No intake/output data recorded.  General appearance: alert and cooperative Resp: clear to auscultation bilaterally Cardio: regular rate and rhythm GI: soft, appropriately tender. Incisions look good. Ostomy pink and productive  Lab Results:  Recent Labs    05/05/21 0454  WBC 5.4  HGB 13.0  HCT 40.1  PLT 227   BMET Recent Labs    05/06/21 0443 05/07/21 0416  NA 134* 136  K 4.1 3.6  CL 100 102  CO2 27 26  GLUCOSE 99 92  BUN 9 9  CREATININE 0.83 0.74  CALCIUM 8.3* 8.7*   PT/INR No results for input(s): LABPROT, INR in the last 72 hours. ABG No results for input(s): PHART, HCO3 in the last 72 hours.  Invalid input(s): PCO2, PO2  Studies/Results: No results found.  Anti-infectives: Anti-infectives (From admission, onward)    Start     Dose/Rate Route Frequency Ordered Stop   05/03/21 1400  neomycin (MYCIFRADIN) tablet 1,000 mg  Status:  Discontinued       See Hyperspace for full Linked Orders Report.   1,000 mg Oral 3 times per day 05/03/21 0528 05/03/21 0531   05/03/21 1400  metroNIDAZOLE (FLAGYL) tablet 1,000 mg  Status:  Discontinued       See Hyperspace for full Linked Orders Report.   1,000 mg Oral 3 times per day 05/03/21 0528 05/03/21 0531   05/03/21 0600  cefoTEtan (CEFOTAN) 2 g in sodium chloride 0.9 % 100 mL IVPB        2 g 200 mL/hr over 30 Minutes  Intravenous On call to O.R. 05/03/21 0528 05/03/21 0755       Assessment/Plan: s/p Procedure(s): XI ROBOTIC ASSISTED LOWER ANTERIOR RESECTION, COLOANAL ANASTAMOSIS, INTRAOPERATIVE ASSESSMENT OF PERFUSION USING FIREFLY, BILATERAL TAP BLOCK (N/A) DIVERTING LOOP ILEOSTOMY (N/A) FLEXIBLE SIGMOIDOSCOPY (N/A)  -Cont BID metamucil -Continue Imodium - output dropped a fair amount on this -Ambulate 5x/day -Flomax -WOCN to see for new ileostomy teaching; will place referral to [facility] for outpatient follow-up as well -Ppx: SQH, SCDs   LOS: 4 days   Nadeen Landau, MD [facility], [place]

## 2021-05-07 NOTE — Plan of Care (Signed)
°  Problem: Education: Goal: Knowledge of General Education information will improve Description: Including pain rating scale, medication(s)/side effects and non-pharmacologic comfort measures Outcome: Adequate for Discharge   Problem: Health Behavior/Discharge Planning: Goal: Ability to manage health-related needs will improve Outcome: Adequate for Discharge   Problem: Clinical Measurements: Goal: Ability to maintain clinical measurements within normal limits will improve Outcome: Adequate for Discharge Goal: Will remain free from infection Outcome: Adequate for Discharge Goal: Diagnostic test results will improve Outcome: Adequate for Discharge Goal: Respiratory complications will improve Outcome: Adequate for Discharge Goal: Cardiovascular complication will be avoided Outcome: Adequate for Discharge   Problem: Activity: Goal: Risk for activity intolerance will decrease Outcome: Adequate for Discharge   Problem: Nutrition: Goal: Adequate nutrition will be maintained Outcome: Adequate for Discharge   Problem: Coping: Goal: Level of anxiety will decrease Outcome: Adequate for Discharge   Problem: Elimination: Goal: Will not experience complications related to bowel motility Outcome: Adequate for Discharge Goal: Will not experience complications related to urinary retention Outcome: Adequate for Discharge   Problem: Pain Managment: Goal: General experience of comfort will improve Outcome: Adequate for Discharge   Problem: Safety: Goal: Ability to remain free from injury will improve Outcome: Adequate for Discharge   Problem: Skin Integrity: Goal: Risk for impaired skin integrity will decrease Outcome: Adequate for Discharge   Problem: Education: Goal: Knowledge of the prescribed therapeutic regimen will improve Outcome: Adequate for Discharge   Problem: Activity: Goal: Ability to implement measures to reduce episodes of fatigue will improve Outcome: Adequate for  Discharge   Problem: Bowel/Gastric: Goal: Will not experience complications related to bowel motility Outcome: Adequate for Discharge   Problem: Coping: Goal: Ability to identify and develop effective coping behavior will improve Outcome: Adequate for Discharge   Problem: Nutritional: Goal: Maintenance of adequate nutrition will improve Outcome: Adequate for Discharge   

## 2021-05-07 NOTE — TOC Transition Note (Signed)
Transition of Care [facility]) - CM/SW Discharge Note   Patient Details  Name: Robert Hatfield MRN: [id number] Date of Birth: 05-Jul-1970  [facility] of [facility] CM/SW Contact:  Lennart Pall, LCSW Phone Number: 05/07/2021, 10:30 AM   Clinical Narrative:    Pt medically cleared for dc today.  HHRN arranged with [facility] and information placed on AVS.  No further TOC needs.   Final next level of care: [place] Barriers to Discharge: No Barriers Identified   Patient Goals and CMS Choice     Choice offered to / list presented to : Patient  Discharge Placement                       Discharge Plan and Services   Discharge Planning Services: CM Consult Post Acute Care Choice: Home Health          DME Arranged: N/A DME Agency: NA       HH Arranged: RN [place] Agency: [place] Date [facility] Agency Contacted: 05/04/21   Representative spoke with at [place]: Cindie  Social Determinants of Health ([place]) Interventions     Readmission Risk Interventions No flowsheet data found.

## 2021-05-07 NOTE — Consult Note (Signed)
[place] Nurse ostomy follow up note Stoma type/location: LLQ loop ileostomy Stomal assessment/size: 1 and 1/4 inch round, red, moist, budded Peristomal assessment: MARSI; at 5 and 7 o'clock, dried, improving   Treatment options for stomal/peristomal skin: skin barrier ring Output : liquid brown Ostomy pouching: 2pc. 2 and 1/4 inch ostomy pouching system with skin barrier ring Education provided:  Met with patient and his wife Pouch change with verbalization of steps and questions answered during pouch change. Discussed diet and gas Discussed pouch change frequency and supplies. Provided [employer]. Unsure if patient with have [place]. Discussed use of XR medications and risk of dehydration in detail  Provided contact information for [facility] for patient . We discussed showering and activities. Patient feels comfortable for dc to home. Enrolled patient in [place] program: Yes   Supplies prepared for discharge: 7 skin barriers, 7 pouches, 7 skin barrier rings.  [place] Nurse will follow along with you for continued support with ostomy teaching and care Stanley MSN, RN, [place], [place], [place]

## 2021-05-07 NOTE — Discharge Summary (Signed)
Patient ID: Robert Hatfield MRN: [id number] DOB/AGE: 06/16/1970 50 y.o.  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   Family history of colon cancer 12/18/2020   Personal history of colonic polyps 12/18/2020   Family history of colonic polyps 12/18/2020   Family history of breast cancer 12/18/2020   Family history of prostate cancer 12/18/2020   Family history of lung cancer 12/18/2020   Adenocarcinoma of colon ([place]) 11/22/2020   B12 deficiency 11/22/2020   Vitamin D deficiency 11/22/2020   Ventral hernia without obstruction or gangrene 78/29/5621   Umbilical hernia without obstruction or gangrene 05/30/2014   Depressive disorder, not elsewhere classified 12/15/2013   Anxiety state, unspecified 04/09/2012   Bulging discs 06/17/2011   Cervicalgia 06/17/2011    Procedures OR 05/03/21 -  Robotic assisted ultra-low anterior resection with double stapled colo-anal anastomosis Diagnostic flexible sigmoidoscopy (necessary to delineate distal extent of lesion) Diverting loop ileostomy Intraoperative assessment of perfusion (ICG)   Hospital Course: He was admitted postoperatively where he recovered well.  He was seen by wound ostomy team and underwent teaching sessions.  He began to have spontaneous effluent from his ileostomy.  His diet was advanced and he tolerated this well.  On 05/07/2021, ileostomy output had slowed down significantly since starting some Imodium.  The character is also thickened.  He underwent additional teaching sessions.  He is comfortable with and stable for discharge home today.  Postoperative expectations were reviewed.  A referral has been placed to our outpatient wound ostomy clinic.  Follow-up in my office has been arranged    Allergies as of 05/07/2021   No Known  Allergies      Medication List     TAKE these medications    ALPRAZolam 0.5 MG tablet Commonly known as: XANAX Take 0.5-1 tablets (0.25-0.5 mg total) by mouth 3 (three) times daily as needed for anxiety.   cyanocobalamin 1000 MCG tablet Take 1,000 mcg by mouth daily.   loperamide 2 MG capsule Commonly known as: IMODIUM Take 1 capsule (2 mg total) by mouth 2 (two) times daily before a meal.   naproxen sodium 220 MG tablet Commonly known as: ALEVE Take 440 mg by mouth 2 (two) times daily as needed (neck pain).   tamsulosin 0.4 MG Caps capsule Commonly known as: FLOMAX Take 1 capsule (0.4 mg total) by mouth daily after supper. Start taking on: May 08, 2021   traMADol 50 MG tablet Commonly known as: Ultram Take 1 tablet (50 mg total) by mouth every 6 (six) hours as needed for up to 5 days (postop pain not controlled with tylenol and ibuprofen first).          Follow-up Information     Ileana Roup, MD Follow up.   Specialties: General Surgery, Colon and Rectal Surgery Why: ~2-3 weeks for postop appointment or sooner if necessary Contact information: [place] [address] [phone]         Care, [facility] Follow up.   Specialty: [place] Why: They will call you to set up a time to come out Contact information: [place] [address] [phone]                 Sharon Mt. Dema Severin, M.D. [facility]

## 2021-05-09 DIAGNOSIS — E559 Vitamin D deficiency, unspecified: Secondary | ICD-10-CM | POA: Diagnosis not present

## 2021-05-09 DIAGNOSIS — M50222 Other cervical disc displacement at C5-C6 level: Secondary | ICD-10-CM | POA: Diagnosis not present

## 2021-05-09 DIAGNOSIS — Z483 Aftercare following surgery for neoplasm: Secondary | ICD-10-CM | POA: Diagnosis not present

## 2021-05-09 DIAGNOSIS — C186 Malignant neoplasm of descending colon: Secondary | ICD-10-CM | POA: Diagnosis not present

## 2021-05-09 DIAGNOSIS — K760 Fatty (change of) liver, not elsewhere classified: Secondary | ICD-10-CM | POA: Diagnosis not present

## 2021-05-09 DIAGNOSIS — Z432 Encounter for attention to ileostomy: Secondary | ICD-10-CM | POA: Diagnosis not present

## 2021-05-09 DIAGNOSIS — F32A Depression, unspecified: Secondary | ICD-10-CM | POA: Diagnosis not present

## 2021-05-09 DIAGNOSIS — C19 Malignant neoplasm of rectosigmoid junction: Secondary | ICD-10-CM | POA: Diagnosis not present

## 2021-05-09 DIAGNOSIS — Z8701 Personal history of pneumonia (recurrent): Secondary | ICD-10-CM | POA: Diagnosis not present

## 2021-05-09 DIAGNOSIS — F419 Anxiety disorder, unspecified: Secondary | ICD-10-CM | POA: Diagnosis not present

## 2021-05-09 DIAGNOSIS — Z87891 Personal history of nicotine dependence: Secondary | ICD-10-CM | POA: Diagnosis not present

## 2021-05-09 DIAGNOSIS — Z9049 Acquired absence of other specified parts of digestive tract: Secondary | ICD-10-CM | POA: Diagnosis not present

## 2021-05-09 DIAGNOSIS — E871 Hypo-osmolality and hyponatremia: Secondary | ICD-10-CM | POA: Diagnosis not present

## 2021-05-09 DIAGNOSIS — E538 Deficiency of other specified B group vitamins: Secondary | ICD-10-CM | POA: Diagnosis not present

## 2021-05-15 ENCOUNTER — Ambulatory Visit (HOSPITAL_COMMUNITY)
Admission: RE | Admit: 2021-05-15 | Discharge: 2021-05-15 | Disposition: A | Discharge status: Home / Self Care | Payer: BC Managed Care – PPO | Source: Ambulatory Visit | Attending: Surgery | Admitting: Surgery

## 2021-05-15 ENCOUNTER — Other Ambulatory Visit: Payer: Self-pay

## 2021-05-15 DIAGNOSIS — K435 Parastomal hernia without obstruction or  gangrene: Secondary | ICD-10-CM | POA: Diagnosis not present

## 2021-05-15 DIAGNOSIS — Z432 Encounter for attention to ileostomy: Secondary | ICD-10-CM | POA: Insufficient documentation

## 2021-05-15 DIAGNOSIS — C20 Malignant neoplasm of rectum: Secondary | ICD-10-CM | POA: Diagnosis not present

## 2021-05-15 DIAGNOSIS — K432 Incisional hernia without obstruction or gangrene: Secondary | ICD-10-CM

## 2021-05-15 NOTE — Progress Notes (Signed)
[facility]   Reason for visit:  RLQ ileostomy  High output, has improved.  HPI:  Adenocarcinoma of the rectum.  ROS  Review of Systems  Gastrointestinal:        RLQ ileostomy with parastomal hernia  Genitourinary:        States he is still adjusting to urination,  frequency and difficulty starting stream.   Musculoskeletal:        Weakness postoperatively.  Advancing walking slowly  All other systems reviewed and are negative. Vital signs:  There were no vitals taken for this visit. Exam:  Physical Exam Constitutional:      Appearance: He is normal weight.  Abdominal:     Palpations: Abdomen is soft.     Comments: RLQ ileostomy with parastomal hernia  Neurological:     General: No focal deficit present.     Mental Status: He is alert.  Psychiatric:        Mood and Affect: Mood normal.    Stoma type/location:  RLQ ileostomy well budded Stomal assessment/size:  1 3/8" pink and moist  Peristomal assessment:  Parastomal hernia present Treatment options for stomal/peristomal skin: barrier ring and 2 piece 2 3/4" pouch.  Wears pouch cover.  Output: liquid brown stool with some solid particulates present.  Ostomy pouching: 2pc. 2 3/4" pouch with barrier ring  Education provided:  Patient performed pouch change independently.  Working with [place].  Takes Immodium and stool has thickened.  He has not had issues with leaking pouch.  We discuss emptying when 1/3 full, twice weekly pouch changes and remaining hydrated.  He sips water all day. He does consume coffee. Mouth is moist, urine is not dark, he reports.     Impression/dx  Ileostomy  Parastomal hernia  Discussion  See back as needed.  Set up with [facility] for supplies.  Flagged products in catalog to place order.  Plan  See back as needed.      Visit time: 50 minutes.   Domenic Moras FNP-BC

## 2021-05-15 NOTE — Discharge Instructions (Signed)
Call clinic as needed Phone line  [phone] Fax line:  [phone] I can fax prescription to [facility] if needed  [email or web address]

## 2021-05-17 ENCOUNTER — Other Ambulatory Visit: Payer: Self-pay

## 2021-05-17 ENCOUNTER — Inpatient Hospital Stay: Payer: BC Managed Care – PPO | Attending: Oncology | Admitting: Oncology

## 2021-05-17 VITALS — BP 116/75 | HR 68 | Temp 98.1°F | Resp 20 | Ht 72.0 in | Wt 177.6 lb

## 2021-05-17 DIAGNOSIS — Z87891 Personal history of nicotine dependence: Secondary | ICD-10-CM | POA: Insufficient documentation

## 2021-05-17 DIAGNOSIS — Z79899 Other long term (current) drug therapy: Secondary | ICD-10-CM | POA: Diagnosis not present

## 2021-05-17 DIAGNOSIS — Z85038 Personal history of other malignant neoplasm of large intestine: Secondary | ICD-10-CM | POA: Diagnosis not present

## 2021-05-17 DIAGNOSIS — C189 Malignant neoplasm of colon, unspecified: Secondary | ICD-10-CM

## 2021-05-17 DIAGNOSIS — Z932 Ileostomy status: Secondary | ICD-10-CM | POA: Diagnosis not present

## 2021-05-17 DIAGNOSIS — E538 Deficiency of other specified B group vitamins: Secondary | ICD-10-CM | POA: Diagnosis not present

## 2021-05-17 DIAGNOSIS — J449 Chronic obstructive pulmonary disease, unspecified: Secondary | ICD-10-CM | POA: Diagnosis not present

## 2021-05-17 DIAGNOSIS — C20 Malignant neoplasm of rectum: Secondary | ICD-10-CM | POA: Diagnosis not present

## 2021-05-17 NOTE — Progress Notes (Signed)
[facility] OFFICE PROGRESS NOTE   Diagnosis: Rectal cancer  INTERVAL HISTORY:   Robert Hatfield returns as scheduled.  He underwent a robotic assisted low anterior resection and coloanal anastomosis, and diverting loop ileostomy on 05/03/2021.  A tattoo was noted in the proximal sigmoid and rectum.  Flexible sigmoidoscopy revealed "islands "of neoplasia extending to 4 to 5 cm from anal verge.  At the location of the proximal tattoo a small polypoid mass was noted.  Both lesions were encompassed in the low anterior resection specimen.  No evidence of metastatic disease. He continues to recover from surgery.  He empties the ileostomy bag approximately 7 times per day when partially full.  He is using Imodium.  He has returned to work part-time. Objective:  Vital signs in last 24 hours:  Blood pressure 116/75, pulse 68, temperature 98.1 F (36.7 C), temperature source Oral, resp. rate 20, height 6' (1.829 m), weight 177 lb 9.6 oz (80.6 kg), SpO2 100 %.   Lymphatics: No cervical, supraclavicular, axillary, or inguinal nodes Resp: Lungs clear bilaterally Cardio: Regular rate and rhythm GI: No hepatosplenomegaly, healing surgical incisions with glue in place Vascular: No leg edema   Lab Results:  Lab Results  Component Value Date   WBC 5.4 05/05/2021   HGB 13.0 05/05/2021   HCT 40.1 05/05/2021   MCV 98.0 05/05/2021   PLT 227 05/05/2021   NEUTROABS 2.3 02/08/2021    CMP  Lab Results  Component Value Date   NA 136 05/07/2021   K 3.6 05/07/2021   CL 102 05/07/2021   CO2 26 05/07/2021   GLUCOSE 92 05/07/2021   BUN 9 05/07/2021   CREATININE 0.74 05/07/2021   CALCIUM 8.7 (L) 05/07/2021   PROT 6.4 (L) 02/08/2021   ALBUMIN 4.1 02/08/2021   AST 22 02/08/2021   ALT 27 02/08/2021   ALKPHOS 44 02/08/2021   BILITOT 0.6 02/08/2021   GFRNONAA >60 05/07/2021   GFRAA 119 11/17/2019    Lab Results  Component Value Date   CEA 3.00 01/24/2021    Lab Results  Component  Value Date   INR 1.0 11/22/2020   LABPROT 11.1 11/22/2020    Imaging:  No results found.  Medications: I have reviewed the patient's current medications.   Assessment/Plan: Colon cancer Biopsy of a descending colon mass at 40 cm revealed adenocarcinoma, normal mismatch repair protein expression Low anterior resection 05/03/2021-grade 3 adenocarcinoma in the descending colon measuring 1.5 x 0.8 cm, no lymphovascular perineural invasion, negative margins, absent treatment effect, tumor invades submucosa, 0/39 lymph nodes, ypT1,ypN0 Rectal mass Circumferential mass beginning at 4 cm from the anal verge-biopsy tubulovillous adenoma with high-grade dysplasia CT 11/27/2020-indeterminate 7 mm segment 4 liver lesion, no other evidence of metastatic disease MRI pelvis 12/10/2020-tumor at 12.1 cm from the anal verge, 6.4 cm from the internal anal sphincter, T3b versus T3c, N0 Sigmoidoscopy-biopsy of rectal mass 12/22/2020-distal edge biopsy, tubulovillous adenoma; proximal edge biopsy focal invasive adenocarcinoma arising in a tubulovillous adenoma with high-grade dysplasia, lymphovascular invasion present  Xeloda/radiation 01/08/2021-02/15/2021 CTs 04/09/2021-diminished size of intraluminal mass in the superior rectum, unchanged subcentimeter low-attenuation lesion in hepatic segment 4B, no evidence of metastatic disease Low anterior resection, diverting loop ileostomy 05/03/2021,tubulovillous adenoma with high-grade dysplasia 1.5 cm from the distal resection margin   3.  COPD 4.  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  Family history of colon polyps 6.  Multiple colon polyps on the colonoscopy 11/16/2020-tubular adenomas 7.  Remote history  of tobacco use 8.  Remote history of heavy alcohol use 9.  Vitamin B12 deficiency     Disposition: Robert Hatfield is recovering from a low anterior resection and diverting ileostomy performed on 05/03/2021.  I reviewed the surgical  pathology report with him.  The rectal mass returned as a tubulovillous adenoma with high-grade dysplasia on the final pathology.  No invasive carcinoma was seen.  There may have been a small focus of invasive carcinoma arising in a tubulovillous adenoma prior to surgery.  The lesion has been completely resected.  He has been diagnosed with a distal descending colon cancer, pathologic stage I.  The final pathology is consistent with a stage I colon cancer and a tubulovillous adenoma of the rectum.  I do not recommend adjuvant therapy.  I will present his case at the GI tumor conference within the next few weeks.  He will return for an office visit and CEA in 3 months.  He will follow-up with Dr. Dema Severin to plan the ileostomy takedown.  We will request MSI and MMR testing on the resected colon tumor.  He will be due for a surveillance colonoscopy in 2023.  Betsy Coder, MD  05/17/2021  11:20 AM

## 2021-05-23 ENCOUNTER — Other Ambulatory Visit: Payer: Self-pay

## 2021-05-23 DIAGNOSIS — C19 Malignant neoplasm of rectosigmoid junction: Secondary | ICD-10-CM | POA: Diagnosis not present

## 2021-05-23 NOTE — Progress Notes (Signed)
The proposed treatment discussed in conference is for discussion purpose only and is not a binding recommendation.  The patients have not been physically examined, or presented with their treatment options.  Therefore, final treatment plans cannot be decided.  

## 2021-05-26 LAB — SURGICAL PATHOLOGY

## 2021-05-28 ENCOUNTER — Institutional Professional Consult (permissible substitution): Payer: BC Managed Care – PPO | Admitting: Family Medicine

## 2021-05-30 NOTE — Progress Notes (Signed)
Chief Complaint  Patient presents with   Consult    Discuss anxiety. No new concerns.     Anxiety:  Patient presents to discuss his anxiety. There has been some escalation of alprazolam use since diagnosis/treatment of colon cancer.  He is not currently taking any preventative meds, and was asked to schedule a visit to discuss this.  Chart reviewed:  started on lexapro in 06/2018, dose increased to 20mg  in 08/2018.  He had also been on Buspar for anxiety. He had a lot going on--stressful job, father sick/died, wife with melanoma metastasis.  He stopped taking lexapro and buspar in 12/2018.  At his September 2020 visit he reported he was doing well. He did get some counseling; once he left his job and father passed away, things got easier.  Wife was stable, getting immunotherapy monthly, doing well. 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  Xanax frequency was noted to increase since the spring/summer. His rx for #45 used to last for 2-3 months.  Fill dates were 05/26/20, 07/24/20, 09/18/20. Then 6/20, 7/28, 9/2, 10/6, 11/10, 12/12 (#45).  He reports he takes a xanax usually in the morning, sometimes before bed. He is an early riser. If he has a lot on schedule for the day, triggers his anxiety, and he takes one in morning. If his mind busy at night, will take to shut mind down to sleep Also he may take one if he finds he is biting his nails a lot. Sometimes he will take 2/day. Doesn't have the time for relaxation techniques that used to work for him. Sometimes does breathing exercises at night.  Colon Cancer was diagnosed after colonoscopy in 10/2020. He is under  the care of Dr. Benay Spice (oncology) and Dr. Dema Severin (surgery). He underwent chemo and radiation, and robotic assisted low anterior resection and coloanal anastomosis, and diverting loop ileostomy on 05/03/2021. He will f/u with Dr. Benay Spice in 3 mos with CE[facility] at that time.  Next colonoscopy due 2023. He will be following up with Dr. Dema Severin for  ileostomy takedown.  He states that they may move that up a month, and do it in mid-February rather than March--that would make him very happy.  He reports that he has to empty the bag at least 7x/day, and take imodium to slow it down. He is having scope next week.  He reports not having his stamina back.  He has some soreness in his abdomen since the surgery. He is able to work half days.  His current stressors, in addition to his own cancer treatments/surgery-- Wife is doing well with her melanoma--she completed immunotherapy and hasn't had any known recurrences.  She has a PFO, has had some small strokes, and may ultimately need repair. His son recently injured his shoulder, had surgery, and will need a lot of rehab (soccer player).    B12 deficiency: level was low at 214 in July. He started on an oral supplement, and levels improved. He is taking his B12 1mg  daily. Lab Results  Component Value Date   VITAMINB12 510 03/02/2021    Vitamin D deficiency: last level was 24.8 in 11/2020.  He was prescribed 12 weeks of 50,000 IU and told to start taking OTC D3 1000 IU upon completion. He is not currently taking any vitamin D.  This was not discussed during today's visit, but noted that this was not on his med list.  PMH, PSH, [place] reviewed  Outpatient Encounter Medications as of 05/31/2021  Medication Sig Note  acetaminophen (TYLENOL) 500 MG tablet Take 1,000 mg by mouth every 8 (eight) hours as needed for moderate pain. 05/31/2021: Last dose 1pm   busPIRone (BUSPAR) 15 MG tablet Start 1/2 tablet twice daily, and increase as directed, if needed, to full tablet twice daily    cyanocobalamin 1000 MCG tablet Take 1,000 mcg by mouth daily.    ibuprofen (ADVIL) 200 MG tablet Take 400 mg by mouth every 8 (eight) hours as needed for moderate pain. 05/31/2021: Last dose 5am   loperamide (IMODIUM) 2 MG capsule Take 1 capsule (2 mg total) by mouth 2 (two) times daily before a meal. 05/31/2021: Last dose  this am   tamsulosin (FLOMAX) 0.4 MG CAPS capsule Take 1 capsule (0.4 mg total) by mouth daily after supper.    [DISCONTINUED] ALPRAZolam (XANAX) 0.5 MG tablet Take 0.5-1 tablets (0.25-0.5 mg total) by mouth 3 (three) times daily as needed for anxiety. 05/31/2021: Last dose last night   ALPRAZolam (XANAX) 0.5 MG tablet Take 0.5-1 tablets (0.25-0.5 mg total) by mouth 3 (three) times daily as needed for anxiety.    [DISCONTINUED] naproxen sodium (ALEVE) 220 MG tablet Take 440 mg by mouth 2 (two) times daily as needed (neck pain).    Facility-Administered Encounter Medications as of 05/31/2021  Medication   0.9 %  sodium chloride infusion   NOT taking buspar prior to today's visit  No Known Allergies  ROS: no fever, chills, nausea, vomiting. Some abdominal pain since surgery.  Emptying ostomy bag 7x/d, loose.  +anxiety with occasional insomnia, as per HPI. Denies depression.  +agitated at times.   PHYSICAL EXAM:  BP 118/72    Pulse 76    Ht 6' (1.829 m)    Wt 182 lb 3.2 oz (82.6 kg)    BMI 24.71 kg/m   Wt Readings from Last 3 Encounters:  05/31/21 180 lb (81.6 kg)  05/31/21 182 lb 3.2 oz (82.6 kg)  05/17/21 177 lb 9.6 oz (80.6 kg)   Well-appearing, though thinner. He is alert and oriented. He is in fair spirits, a little easily agitated today. He had expressed to the nurse that he didn't understand why he had to come, that this was poor timing (but he had postponed/rescheduled some visits). He is frustrated that he has a medication that is working very well for him, doing what he needs it to do, without causing side effects, so doesn't understand why anything has to be changed.  Normal hygiene, grooming, eye contact and speech. Slightly irritable.  Affect slightly flattened.    GAD 7 : Generalized Anxiety Score 05/31/2021 11/11/2018 08/19/2018 07/08/2018  Nervous, Anxious, on Edge 2 0 1 3  Control/stop worrying 1 0 1 3  Worry too much - different things 1 0 1 3  Trouble relaxing 1 0 3  3  Restless 1 2 1 1   Easily annoyed or irritable 1 0 1 1  Afraid - awful might happen 0 0 0 1  Total GAD 7 Score 7 2 8 15   Anxiety Difficulty Not difficult at all - - -    Depression screen [facility] 2/9 05/31/2021 11/22/2020 11/17/2019 11/11/2018 11/11/2018  Decreased Interest 0 0 0 0 0  Down, Depressed, Hopeless 0 0 0 0 0  PHQ - 2 Score 0 0 0 0 0  Altered sleeping 0 - - 0 -  Tired, decreased energy 0 - - 0 -  Change in appetite 0 - - 0 -  Feeling bad or failure about yourself  0 - -  0 -  Trouble concentrating 0 - - 0 -  Moving slowly or fidgety/restless 0 - - 0 -  Suicidal thoughts 0 - - 0 -  PHQ-9 Score 0 - - 0 -  Difficult doing work/chores Not difficult at all - - - -    ASSESSMENT/PLAN:  Generalized anxiety disorder - many situational stressors. Discussed risks of benzo's and reasons to not rely solely on that. Discussed other meds in detail, trial Buspar  - Plan: busPIRone (BUSPAR) 15 MG tablet, ALPRAZolam (XANAX) 0.5 MG tablet  Discussed that he had prior problems with alcohol, which could lead to similar problems with benzodiazepines.  Discussed potential for tolerance and withdrawal. Discussed that it is acting more as a band-aid, rather than preventing the anxiety. We discussed SSRI's (which he previously took, but also had some depression at that time), and Effexor. He was hesitant to start a new medication that could potentially have a lot of side effects, when already not feeling great, and especially one with potential GI side effects. He is more willing to start Buspar. He has taken this in the past without side effects. He won't start this until after his procedure next week.  Recommended counseling. Encouraged him to retry techniques that had been helpful for him in the past--not necessarily doing 45 min meditation session, but even just 5-10 minutes is helpful.   I spent 35 minutes dedicated to the care of this patient, including pre-visit review of records, face to face  time, post-visit ordering of testing and documentation.  Start Buspar. 1/2 tablet twice daily. After a week, you can increase to 1 pil in the morning and 1/2 in the evening. And in another week, if anxiety not improving (and assuming no side effects), you can increase further to 1 pill twice daily.

## 2021-05-31 ENCOUNTER — Ambulatory Visit (INDEPENDENT_AMBULATORY_CARE_PROVIDER_SITE_OTHER): Payer: BC Managed Care – PPO | Admitting: Family Medicine

## 2021-05-31 ENCOUNTER — Other Ambulatory Visit: Payer: Self-pay

## 2021-05-31 ENCOUNTER — Encounter: Payer: Self-pay | Admitting: Family Medicine

## 2021-05-31 ENCOUNTER — Encounter (HOSPITAL_COMMUNITY): Payer: Self-pay | Admitting: Surgery

## 2021-05-31 DIAGNOSIS — F411 Generalized anxiety disorder: Secondary | ICD-10-CM

## 2021-05-31 MED ORDER — BUSPIRONE HCL 15 MG PO TABS
ORAL_TABLET | ORAL | 5 refills | Status: DC
Start: 2021-05-31 — End: 2021-11-26

## 2021-05-31 MED ORDER — ALPRAZOLAM 0.5 MG PO TABS: 0.2500 mg | ORAL_TABLET | Freq: Three times a day (TID) | ORAL | 0 refills | Status: DC | PRN

## 2021-05-31 NOTE — Patient Instructions (Addendum)
DUE TO COVID-19 ONLY ONE VISITOR IS ALLOWED TO COME WITH YOU AND STAY IN THE WAITING ROOM ONLY DURING PRE OP AND PROCEDURE.   **NO VISITORS ARE ALLOWED IN THE SHORT STAY AREA OR RECOVERY ROOM!!**       Your procedure is scheduled on: Monday, Jun 04, 2021   Report to [facility] Main Entrance    Report to admitting at 2:15 PM   Call this number if you have problems the morning of surgery [phone]   Do not eat food :After Midnight.   May have liquids until   1:30 PM day of surgery  CLEAR LIQUID DIET  Foods Allowed                                                                     Foods Excluded  Water, Black Coffee and tea, regular and decaf                             liquids that you cannot  Plain Jell-O in any flavor  (No red)                                           see through such as: Fruit ices (not with fruit pulp)                                     milk, soups, orange juice              Iced Popsicles (No red)                                    All solid food                                   Apple juices Sports drinks like Gatorade (No red) Lightly seasoned clear broth or consume(fat free) Sugar  Sample Menu Breakfast                                Lunch                                     Supper Cranberry juice                    Beef broth                            Chicken broth Jell-O                                     Grape juice  Apple juice Coffee or tea                        Jell-O                                      Popsicle                                                Coffee or tea                        Coffee or tea    Oral Hygiene is also important to reduce your risk of infection.                                    Remember - BRUSH YOUR TEETH THE MORNING OF SURGERY WITH YOUR REGULAR TOOTHPASTE   Do NOT smoke after Midnight   Take these medicines the morning of surgery with A SIP OF WATER: None                                You may not have any metal on your body including jewelry, and body piercing             Do not wear lotions, powders, perfumes/cologne, or deodorant              Men may shave face and neck.   Do not bring valuables to the hospital. [place].   Contacts, dentures or bridgework may not be worn into surgery.    Patients discharged on the day of surgery will not be allowed to drive home.  Someone needs to stay with you for the first 24 hours after anesthesia.   Special Instructions: Bring a copy of your healthcare power of attorney and living will documents         the day of surgery if you haven't scanned them before.              Please read over the following fact sheets you were given: IF YOU HAVE [place] [phone]

## 2021-05-31 NOTE — Progress Notes (Signed)
For Short Stay: [place] appointment date: N/A Date of COVID positive in last 90 days: N/A   For Anesthesia: PCP - Rita Ohara, MD last office visit note 11/22/20 in epic Cardiologist - N/A  Chest CT- 04/09/21 in epic EKG - N/A Stress Test - N/A ECHO - N/A Cardiac Cath - N/A Pacemaker/ICD device last checked: N/A Pacemaker orders received:N/A Device Rep notified:N/A  Spinal Cord Stimulator:N/A  Sleep Study - N/A CPAP - N/A  Fasting Blood Sugar - N/A Checks Blood Sugar __N/A___ times a day Date and result of last Hgb A1c- 04/16/21 (5.6)  Blood Thinner Instructions:N/A Aspirin Instructions: N/A Last Dose:N/A  Activity level:    Able to exercise without chest pain and/or shortness of breath      Anesthesia review: N/A  Patient denies shortness of breath, fever, cough and chest pain at PAT appointment   Patient verbalized understanding of instructions that were given to them at the PAT appointment. Patient was also instructed that they will need to review over the PAT instructions again at home before surgery.

## 2021-05-31 NOTE — Patient Instructions (Signed)
Start Buspar. 1/2 tablet twice daily. After a week, you can increase to 1 pil in the morning and 1/2 in the evening. And in another week, if anxiety not improving (and assuming no side effects), you can increase further to 1 pill twice daily.  Consider resuming counseling.

## 2021-06-04 ENCOUNTER — Encounter (HOSPITAL_COMMUNITY)
Admission: RE | Disposition: A | Discharge status: Home / Self Care | Payer: Self-pay | Source: Ambulatory Visit | Attending: Surgery

## 2021-06-04 ENCOUNTER — Encounter (HOSPITAL_COMMUNITY): Payer: Self-pay | Admitting: Surgery

## 2021-06-04 ENCOUNTER — Other Ambulatory Visit: Payer: Self-pay

## 2021-06-04 ENCOUNTER — Ambulatory Visit: Payer: Self-pay | Admitting: Surgery

## 2021-06-04 ENCOUNTER — Ambulatory Visit (HOSPITAL_COMMUNITY)
Admission: RE | Admit: 2021-06-04 | Discharge: 2021-06-04 | Disposition: A | Discharge status: Home / Self Care | Payer: BC Managed Care – PPO | Source: Ambulatory Visit | Attending: Surgery | Admitting: Surgery

## 2021-06-04 DIAGNOSIS — Z01818 Encounter for other preprocedural examination: Secondary | ICD-10-CM | POA: Insufficient documentation

## 2021-06-04 DIAGNOSIS — Z980 Intestinal bypass and anastomosis status: Secondary | ICD-10-CM | POA: Diagnosis not present

## 2021-06-04 DIAGNOSIS — Z85048 Personal history of other malignant neoplasm of rectum, rectosigmoid junction, and anus: Secondary | ICD-10-CM | POA: Diagnosis not present

## 2021-06-04 DIAGNOSIS — Z85038 Personal history of other malignant neoplasm of large intestine: Secondary | ICD-10-CM | POA: Insufficient documentation

## 2021-06-04 HISTORY — DX: Polyneuropathy, unspecified: G62.9

## 2021-06-04 HISTORY — DX: Umbilical hernia without obstruction or gangrene: K42.9

## 2021-06-04 HISTORY — PX: FLEXIBLE SIGMOIDOSCOPY: SHX5431

## 2021-06-04 SURGERY — SIGMOIDOSCOPY, FLEXIBLE
Anesthesia: Monitor Anesthesia Care

## 2021-06-04 MED ORDER — DIPHENHYDRAMINE HCL 50 MG/ML IJ SOLN
INTRAMUSCULAR | Status: AC
  Filled 2021-06-04: qty 1

## 2021-06-04 MED ORDER — MIDAZOLAM HCL (PF) 5 MG/ML IJ SOLN
INTRAMUSCULAR | Status: DC | PRN
  Administered 2021-06-04 (×2): 1 mg via INTRAVENOUS
  Administered 2021-06-04: 2 mg via INTRAVENOUS
  Administered 2021-06-04 (×2): 1 mg via INTRAVENOUS

## 2021-06-04 MED ORDER — FENTANYL CITRATE (PF) 100 MCG/2ML IJ SOLN
INTRAMUSCULAR | Status: DC | PRN
Start: 2021-06-04 — End: 2021-06-04
  Administered 2021-06-04 (×4): 25 ug via INTRAVENOUS
  Administered 2021-06-04: 50 ug via INTRAVENOUS

## 2021-06-04 MED ORDER — LACTATED RINGERS IV SOLN
INTRAVENOUS | Status: DC
  Administered 2021-06-04: 1000 mL via INTRAVENOUS

## 2021-06-04 MED ORDER — MIDAZOLAM HCL (PF) 5 MG/ML IJ SOLN
INTRAMUSCULAR | Status: AC
  Filled 2021-06-04: qty 2

## 2021-06-04 MED ORDER — SODIUM CHLORIDE 0.9 % IV SOLN: INTRAVENOUS | Status: DC

## 2021-06-04 MED ORDER — FENTANYL CITRATE (PF) 100 MCG/2ML IJ SOLN
INTRAMUSCULAR | Status: AC
  Filled 2021-06-04: qty 4

## 2021-06-04 NOTE — Op Note (Signed)
[facility] Patient Name: Robert Hatfield Procedure Date: 06/04/2021 MRN: [id number] Attending MD: Ileana Roup MD, MD Date of Birth: 09-Oct-1970 CSN: [id number] Age: 51 Admit Type: Inpatient Procedure:                Flexible Sigmoidoscopy Indications:              Personal history of malignant neoplasm of the                            colon, Personal history of malignant rectal                            neoplasm, Preoperative assessment Providers:                Sharon Mt. Lashelle Koy MD, MD, Jeanella Cara,                            RN, Tyna Jaksch Technician, Despina Pole,                            Technician, Nicholes Calamity Referring MD:              Medicines:                Fentanyl 150 micrograms IV, Midazolam 6 mg IV Complications:            No immediate complications. Estimated Blood Loss:     Estimated blood loss: none. Procedure:                Pre-Anesthesia Assessment:                           - Prior to the procedure, a History and Physical                            was performed, and patient medications, allergies                            and sensitivities were reviewed. The patient's                            tolerance of previous anesthesia was reviewed.                           - The risks and benefits of the procedure and the                            sedation options and risks were discussed with the                            patient. All questions were answered and informed                            consent was obtained.                           - Patient identification  and proposed procedure                            were verified prior to the procedure by the                            physician, the nurse and the technician. The                            procedure was verified in the pre-procedure area in                            the endoscopy suite.                           - Pre-procedure physical  examination revealed no                            contraindications to sedation.                           - ASA Grade Assessment: II - A patient with mild                            systemic disease.                           - After reviewing the risks and benefits, the                            patient was deemed in satisfactory condition to                            undergo the procedure.                           - The anesthesia plan was to use moderate                            sedation/analgesia (conscious sedation).                           - Immediately prior to administration of                            medications, the patient was re-assessed for                            adequacy to receive sedatives.                           - Sedation was administered by an endoscopy nurse.                            The sedation level attained was moderate.                           -  The heart rate, respiratory rate, oxygen                            saturations, blood pressure, adequacy of pulmonary                            ventilation, and response to care were monitored                            throughout the procedure.                           - The physical status of the patient was                            re-assessed after the procedure.                           After obtaining informed consent, the scope was                            passed under direct vision. The [id number]-[id number] ([id number])                            Olympus endoscope was introduced through the anus                            and advanced to the the splenic flexure. The                            flexible sigmoidoscopy was accomplished without                            difficulty. The patient tolerated the procedure                            well. The quality of the bowel preparation was                            adequate. Scope In: Scope Out: Findings:      The perianal and digital rectal  examinations were normal. Anastomosis is       palpable at ~2-3 cm from anal verge. No palpable masses.      There was evidence of a prior functional end-to-end colo-colonic       anastomosis in the distal rectum. This was patent and was characterized       by healthy appearing mucosa and an intact staple line. The anastomosis       was traversed.      The exam was otherwise without abnormality. Impression:               - Patent functional end-to-end colo-colonic                            anastomosis, characterized by healthy appearing  mucosa and an intact staple line.                           - The examination was otherwise normal.                           - No specimens collected. Moderate Sedation:      The administration of moderate sedation was initiated at 20:00 PM.      Moderate (conscious) sedation was administered by the endoscopy nurse       and supervised by the endoscopist. The following parameters were       monitored: oxygen saturation, heart rate, blood pressure, and response       to care. Total physician intraservice time was 20 minutes. Recommendation:           - Resume previous diet indefinitely. Procedure Code(s):        --- Professional ---                           [phone], Sigmoidoscopy, flexible; diagnostic,                            including collection of specimen(s) by brushing or                            washing, when performed (separate procedure)                           G0500, Moderate sedation services provided by the                            same physician or other qualified health care                            professional performing a gastrointestinal                            endoscopic service that sedation supports,                            requiring the presence of an independent trained                            observer to assist in the monitoring of the                            patient's level of  consciousness and physiological                            status; initial 15 minutes of intra-service time;                            patient age 20 years or older (additional time may                            be reported  with [phone], as appropriate) Diagnosis Code(s):        --- Professional ---                           Z98.0, Intestinal bypass and anastomosis status                           Z85.038, Personal history of other malignant                            neoplasm of large intestine                           Z85.048, Personal history of other malignant                            neoplasm of rectum, rectosigmoid junction, and anus                           Z01.818, Encounter for other preprocedural                            examination CPT copyright 2019 [employer]. All rights reserved. The codes documented in this report are preliminary and upon coder review may  be revised to meet current compliance requirements. Nadeen Landau, MD Ileana Roup MD, MD 06/04/2021 3:23:11 PM This report has been signed electronically. Number of Addenda: 0

## 2021-06-04 NOTE — H&P (Signed)
CC: Here today for flexible sigmoidoscopy, preop evaluation/planning prior to ileostomy takedown  HPI: Robert Hatfield is an 51 y.o. male whom underwent his first colonoscopy with Dr. Henrene Pastor 11/16/2020. Findings: 1. 8 polyps in the transverse and ascending colon removed Tubular adenomas 2. 2 cm polyp in the sigmoid at 30 cm, removed. Tubular ad.enoma 3. Nonobstructing mass in the descending colon at approximately 40 cm measuring 2.5 x 1.5 cm. This was biopsied and tattooed - appears to be distal and at mass. Biopsy shows adenocarcinoma 4. Large lateral spreading mass in the rectum beginning at 4 cm from the anal verge that is completely circumferential, bulky, but soft. Extends proximal to about 15 cm. Biopsied. Area was tattooed proximally. Superficial fragments of TVA with focal HGD 5. Diverticulosis.  CT chest/abdomen/pelvis 11/27/2020 shows a 7 mm low attenuating lesion in segment 4, too small to characterize. Slight liver steatosis.  MRI pelvis 12/11/2020: Rectal mass cmriT3b/c;N0; 6.4 cm from IAS. No EMVI or levator involvement. ?threatened CRMs on left  Genetics evaluation - CancerNext-Expanded genel panel through [facility] - negative  Repeat flex sig with addn'l biopsies 12/22/20 -demonstrated from the proximal edge of the biopsy focal invasive adenocarcinoma arising in a tubulovillous adenoma with high-grade dysplasia. Lymphovascular invasion is present. Distal rectal tumor biopsy demonstrated tubulovillous adenoma.   Case presented at [facility] with myself and Drs. Sherrill and [place] present. Given invasive adenocarcinoma findings, LVI, and additionally an MRI showing lesion to be cmriT3b/c proceeding with at least up front cXRT. This would provide local treatment to the rectal cancer which upfront would be more beneficial than after surgery particularly with suggestion of threatened CRMs on MRI; additionally from functional standpoint would be ideal than radiating his colorectal anastomosis and  neorectal conduit; subsequently surgery which would involve most likely a left hemicolectomy with low anterior resection, diverting loop ileostomy, possible takedown of splenic flexure.  cXRT 01/08/21 - 02/14/21  OR 05/03/21 -  1. Robotic assisted ultra-low anterior resection with double stapled colo-anal anastomosis 2. Diagnostic flexible sigmoidoscopy (necessary to delineate distal extent of lesion) 3. Diverting loop ileostomy 4. Intraoperative assessment of perfusion (ICG)  FINDINGS: Tattoo in proximal sigmoid and rectum. Flexible sigmoidoscopy demonstrates that within the rectum there has been at least partial treatment response. Extending down to approximately 4-5 cm from verge, there is now islands of neoplasia. At the location of the more proximal tattoo, there is an apparent small polypoid mass/lesion that is concordant with the endoscopic photos taken by Dr. Henrene Pastor. This is also at the location of the tattoo. A low anterior resection is able to be carried out encompassing both of these lesions. In order to ensure an adequate distal margin, an ultralow anterior resection was utilized. A double stapled essentially coloanal anastomosis was fashioned about 2 cm above the anal verge.   No obvious metastatic disease on visceral parietal peritoneum or liver.    Elongated mesentery of both small bowel and colon.   A well perfused, tension-free, hemostatic, airtight 29 mm double stapled coloanal anastomosis was fashioned approximately 2 cm from the anal verge.   He was admitted postoperatively and recovered uneventfully. He was discharged home on POD#4. Returns for follow-up.  PATHOLOGY SURGICAL PATHOLOGY  * THIS IS AN ADDENDUM REPORT  CASE: [id number]  PATIENT: Robert Hatfield  Surgical Pathology Report  *Addendum   Reason for Addendum #1:  DNA Mismatch Repair IHC Results   Clinical History: Rectal mass, descending colon cancer (crm)   FINAL MICROSCOPIC DIAGNOSIS:   A. COLON,  RECTOSIGMOID, RESECTION:  - Invasive poorly differentiated adenocarcinoma, 3.9 cm from the  proximal resection margin.  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  - Thirty nine lymph nodes, negative for metastatic carcinoma.  - See Oncology Table below.   B. FINAL DISTAL MARGIN:  - Segment of colon, negative for malignancy.   He followed up with Dr. Benay Spice 04/2021. He returns to our office to follow-up with me. He has been doing reasonably well. He is tolerating a diet without any difficulty. He denies any issues nausea, vomiting, fever, chills. He has some discomfort at the ileostomy site but the ostomy has been working well. Output is generally well controlled and thicker when he takes fiber and Imodium. Thinner if he does not. He has been working to stay hydrated without any significant difficulty.   Here today for flex sig. Has been doing reasonably well. Denies any changes in health/health hx since we met in office 05/28/21.   PMH: Denies  PSH: Inguinal hernia repair as infant; multiple orthopedic surgeries  FHx: Mother had lung cancer in her 31s; father had colon cancer; paternal grandmother had both colon and breast cancer  Social Hx: Denies use of tobacco/etoh; works as a Clinical biochemist  Past Medical History:  Diagnosis Date   Acne vulgaris    s/p accutane treatment   Anxiety    on meds   Arthritis    big toe   Asthma    childhood   Bulging discs 05/20/2005   C5-C6; treated with injections (Dr. Nelva Bush)   Cancer [facility])    colorectal   Depression 2000-2002, 2015   treated with Zoloft and Klonopin x 1-2 yrs; recurrent in 2015   Family history of breast cancer 12/18/2020   Family history of colon cancer 12/18/2020   Family history of colonic polyps 12/18/2020   Family history of lung cancer 12/18/2020   Family history of prostate cancer 12/18/2020   Neuromuscular disorder ([place])    bil feet   Peripheral  neuropathy    Personal history of colonic polyps 12/18/2020   Pneumonia    as a child   Pure hyperglyceridemia    Umbilical hernia     Past Surgical History:  Procedure Laterality Date   COLONOSCOPY     DEBRIDEMENT TENNIS ELBOW Right 2000   DIVERTING ILEOSTOMY N/A 05/03/2021   Procedure: DIVERTING LOOP ILEOSTOMY;  Surgeon: Ileana Roup, MD;  Location: [facility];  Service: General;  Laterality: N/A;   FLEXIBLE SIGMOIDOSCOPY N/A 05/03/2021   Procedure: FLEXIBLE SIGMOIDOSCOPY;  Surgeon: Ileana Roup, MD;  Location: [facility];  Service: General;  Laterality: N/A;   HUMERUS FRACTURE SURGERY Right    age 56   KNEE ARTHROSCOPY Left    age 25-20   KNEE ARTHROSCOPY Right    age [age]   SIGMOIDOSCOPY  12/22/2020   TONSILLECTOMY  child   XI ROBOTIC ASSISTED LOWER ANTERIOR RESECTION N/A 05/03/2021   Procedure: XI ROBOTIC ASSISTED LOWER ANTERIOR RESECTION, COLOANAL ANASTAMOSIS, INTRAOPERATIVE ASSESSMENT OF PERFUSION USING FIREFLY, BILATERAL TAP BLOCK;  Surgeon: Ileana Roup, MD;  Location: [facility];  Service: General;  Laterality: N/A;    Family History  Problem Relation Age of Onset   Depression Mother    Lung cancer Mother        smoking hx   Alcoholism Mother    Colon cancer Father        dx after 13   Colon polyps Father  Hypertension Father    Prostate cancer Father        dx 53s   AAA (abdominal aortic aneurysm) Father        repaired   Kidney disease Father        s/p AAA repair; on dialysis   Lung cancer Father        dx after 74; smoking hx   Colon polyps Sister 82       unknown number   Depression Brother    Colon polyps Brother        unknown number   Lung cancer Paternal Uncle        smoking hx   Colon cancer Paternal Grandmother        dx after 65   Breast cancer Paternal Grandmother        dx after 24   Diabetes Neg Hx    Heart disease Neg Hx    Stomach cancer Neg Hx    Rectal cancer Neg Hx     Social:  reports that he quit smoking  about 18 years ago. His smoking use included cigarettes. He has never used smokeless tobacco. He reports that he does not currently use alcohol. He reports that he does not currently use drugs after having used the following drugs: Marijuana. Frequency: 2.00 times per week.  Allergies: No Known Allergies  Medications: I have reviewed the patient's current medications.  No results found for this or any previous visit (from the past 48 hour(s)).  No results found.  ROS - all of the below systems have been reviewed with the patient and positives are indicated with bold text General: chills, fever or night sweats Eyes: blurry vision or double vision ENT: epistaxis or sore throat Allergy/Immunology: itchy/watery eyes or nasal congestion Hematologic/Lymphatic: bleeding problems, blood clots or swollen lymph nodes Endocrine: temperature intolerance or unexpected weight changes Breast: new or changing breast lumps or nipple discharge Resp: cough, shortness of breath, or wheezing CV: chest pain or dyspnea on exertion GI: as per HPI GU: dysuria, trouble voiding, or hematuria MSK: joint pain or joint stiffness Neuro: TIA or stroke symptoms Derm: pruritus and skin lesion changes Psych: anxiety and depression  PE Height 6' (1.829 m), weight 81.6 kg. Constitutional: NAD; conversant Eyes: Moist conjunctiva; no lid lag; anicteric Lungs: Normal respiratory effort CV: RRR; no palpable thrills; no pitting edema GI: Abd soft, NT/ND MSK: Normal range of motion of extremities Psychiatric: Appropriate affect; alert and oriented x3  No results found for this or any previous visit (from the past 48 hour(s)).  No results found.    A/P: Robert Hatfield is an 51 y.o. male now s/p robotic LAR/CAA/DLI 05/03/21 for both rectal cancer + descending colon cancer - final path following cxrt showed dysplastic rectal mass without evident residual adenocarcinoma; poorly differentiated - ypT1N0M0  -Plan for  diagnostic flexible sigmoidoscopy today for surgical planning prior to ileostomy takedown -The relevant anatomy as a pertains to his configuration was reviewed.  We discussed the planned procedure, material risks (including, not limited to, bleeding, infection, perforation of colon, injury to other structures such as spleen, need for additional procedures), benefits and alternatives were reviewed.  His questions were answered to satisfaction, he voiced understanding, and opted to proceed.  Nadeen Landau, MD [facility], [facility]

## 2021-06-05 ENCOUNTER — Encounter (HOSPITAL_COMMUNITY): Payer: Self-pay | Admitting: Surgery

## 2021-06-11 DIAGNOSIS — H53143 Visual discomfort, bilateral: Secondary | ICD-10-CM | POA: Diagnosis not present

## 2021-06-13 ENCOUNTER — Other Ambulatory Visit (HOSPITAL_COMMUNITY): Payer: Self-pay | Admitting: *Deleted

## 2021-06-14 ENCOUNTER — Other Ambulatory Visit (HOSPITAL_COMMUNITY): Payer: Self-pay | Admitting: Surgery

## 2021-06-14 ENCOUNTER — Other Ambulatory Visit (HOSPITAL_COMMUNITY): Payer: Self-pay | Admitting: *Deleted

## 2021-06-14 ENCOUNTER — Other Ambulatory Visit: Payer: Self-pay | Admitting: Surgery

## 2021-06-14 ENCOUNTER — Ambulatory Visit: Payer: Self-pay | Admitting: Surgery

## 2021-06-14 DIAGNOSIS — C189 Malignant neoplasm of colon, unspecified: Secondary | ICD-10-CM

## 2021-06-14 NOTE — Progress Notes (Signed)
Covid test on 07/03/21 at 0815am come thru main entrance at [facility].  Have a seat in the lobby on the right as you come thru the door.  Call [phone] and let them know you are here for covid testing.                 Robert Hatfield  06/14/2021   Your procedure is scheduled on:   07/05/21   Report to [facility] Main  Entrance   Report to admitting at   1045AM     Call this number if you have problems the morning of surgery [phone]    Remember: Do not eat food , candy gum or mints :After Midnight. You may have clear liquids from midnight until __  1000am   CLEAR LIQUID DIET   Foods Allowed                                                                       Coffee and tea, regular and decaf                              Plain Jell-O any favor except red or purple                                            Fruit ices (not with fruit pulp)                                      Iced Popsicles                                     Carbonated beverages, regular and diet                                    Cranberry, grape and apple juices Sports drinks like Gatorade Lightly seasoned clear broth or consume(fat free) Sugar   _____________________________________________________________________    BRUSH YOUR TEETH MORNING OF SURGERY AND RINSE YOUR MOUTH OUT, NO CHEWING GUM CANDY OR MINTS.     Take these medicines the morning of surgery with A SIP OF WATER:  buspar   DO NOT TAKE ANY DIABETIC MEDICATIONS DAY OF YOUR SURGERY                               You may not have any metal on your body including hair pins and              piercings  Do not wear jewelry, make-up, lotions, powders or perfumes, deodorant             Do not wear nail polish on your fingernails.  Do not shave  48 hours prior to surgery.  Men may shave face and neck.   Do not bring valuables to the hospital. [place].  Contacts, dentures  or bridgework may not be worn into surgery.  Leave suitcase in the car. After surgery it may be brought to your room.     Patients discharged the day of surgery will not be allowed to drive home. IF YOU ARE HAVING SURGERY AND GOING HOME THE SAME DAY, YOU MUST HAVE AN ADULT TO DRIVE YOU HOME AND BE WITH YOU FOR 24 HOURS. YOU MAY GO HOME BY TAXI OR UBER OR ORTHERWISE, BUT AN ADULT MUST ACCOMPANY YOU HOME AND STAY WITH YOU FOR 24 HOURS.  Name and phone number of your driver:  Special Instructions: N/A              Please read over the following fact sheets you were given: _____________________________________________________________________  [facility] - Preparing for Surgery Before surgery, you can play an important role.  Because skin is not sterile, your skin needs to be as free of germs as possible.  You can reduce the number of germs on your skin by washing with CHG (chlorahexidine gluconate) soap before surgery.  CHG is an antiseptic cleaner which kills germs and bonds with the skin to continue killing germs even after washing. Please DO NOT use if you have an allergy to CHG or antibacterial soaps.  If your skin becomes reddened/irritated stop using the CHG and inform your nurse when you arrive at Short Stay. Do not shave (including legs and underarms) for at least 48 hours prior to the first CHG shower.  You may shave your face/neck. Please follow these instructions carefully:  1.  Shower with CHG Soap the night before surgery and the  morning of Surgery.  2.  If you choose to wash your hair, wash your hair first as usual with your  normal  shampoo.  3.  After you shampoo, rinse your hair and body thoroughly to remove the  shampoo.                           4.  Use CHG as you would any other liquid soap.  You can apply chg directly  to the skin and wash                       Gently with a scrungie or clean washcloth.  5.  Apply the CHG Soap to your body ONLY FROM THE NECK DOWN.   Do not use  on face/ open                           Wound or open sores. Avoid contact with eyes, ears mouth and genitals (private parts).                       Wash face,  Genitals (private parts) with your normal soap.             6.  Wash thoroughly, paying special attention to the area where your surgery  will be performed.  7.  Thoroughly rinse your body with warm water from the neck down.  8.  DO NOT shower/wash with your normal soap after using and rinsing off  the CHG Soap.  9.  Pat yourself dry with a clean towel.            10.  Wear clean pajamas.            11.  Place clean sheets on your bed the night of your first shower and do not  sleep with pets. Day of Surgery : Do not apply any lotions/deodorants the morning of surgery.  Please wear clean clothes to the hospital/surgery center.  FAILURE TO FOLLOW THESE INSTRUCTIONS MAY RESULT IN THE CANCELLATION OF YOUR SURGERY PATIENT SIGNATURE_________________________________  NURSE SIGNATURE__________________________________  ________________________________________________________________________

## 2021-06-14 NOTE — Progress Notes (Addendum)
Anesthesia Review:  PCP: Robert Hatfield  Cardiologist : none  Chest x-ray : 04/10/21- CT Chest  06/04/21- flexible sigmoid  EKG : Echo : Stress test: Cardiac Cath :  Activity level: can do  a flight of stairs without difficulty  Sleep Study/ CPAP : none  Fasting Blood Sugar :      / Checks Blood Sugar -- times a day:   Blood Thinner/ Instructions /Last Dose: ASA / Instructions/ Last Dose :   06/19/21- PT thought appt was on 06/20/21.  Medical hx and preop instructions were completed via phone. PT coming in on 07/03/21 at 0800am for labs pick up preop instructions and for coivd test at 0815 am.

## 2021-06-18 ENCOUNTER — Ambulatory Visit (HOSPITAL_COMMUNITY)
Admission: RE | Admit: 2021-06-18 | Discharge: 2021-06-18 | Disposition: A | Discharge status: Home / Self Care | Payer: BC Managed Care – PPO | Source: Ambulatory Visit | Attending: Surgery | Admitting: Surgery

## 2021-06-18 ENCOUNTER — Other Ambulatory Visit: Payer: Self-pay

## 2021-06-18 ENCOUNTER — Other Ambulatory Visit (HOSPITAL_COMMUNITY): Payer: Self-pay | Admitting: *Deleted

## 2021-06-18 DIAGNOSIS — K6389 Other specified diseases of intestine: Secondary | ICD-10-CM | POA: Diagnosis not present

## 2021-06-18 DIAGNOSIS — C189 Malignant neoplasm of colon, unspecified: Secondary | ICD-10-CM | POA: Insufficient documentation

## 2021-06-18 DIAGNOSIS — Z980 Intestinal bypass and anastomosis status: Secondary | ICD-10-CM | POA: Diagnosis not present

## 2021-06-18 MED ORDER — IOHEXOL 300 MG/ML  SOLN
100.0000 mL | Freq: Once | INTRAMUSCULAR | Status: AC | PRN
  Administered 2021-06-18: 100 mL

## 2021-06-18 MED ORDER — IOHEXOL 300 MG/ML  SOLN
50.0000 mL | Freq: Once | INTRAMUSCULAR | Status: AC | PRN
  Administered 2021-06-18: 50 mL

## 2021-06-18 MED ORDER — IOHEXOL 300 MG/ML  SOLN
100.0000 mL | Freq: Once | INTRAMUSCULAR | Status: DC | PRN
  Administered 2021-06-18: 100 mL

## 2021-06-19 ENCOUNTER — Encounter (HOSPITAL_COMMUNITY): Payer: Self-pay | Admitting: Surgery

## 2021-06-19 ENCOUNTER — Encounter (HOSPITAL_COMMUNITY): Payer: BC Managed Care – PPO | Attending: Family Medicine

## 2021-07-03 ENCOUNTER — Encounter (HOSPITAL_COMMUNITY)
Admission: RE | Admit: 2021-07-03 | Discharge: 2021-07-03 | Disposition: A | Discharge status: Home / Self Care | Payer: BC Managed Care – PPO | Source: Ambulatory Visit | Attending: Surgery | Admitting: Surgery

## 2021-07-03 ENCOUNTER — Other Ambulatory Visit: Payer: Self-pay

## 2021-07-03 DIAGNOSIS — Z8042 Family history of malignant neoplasm of prostate: Secondary | ICD-10-CM | POA: Diagnosis not present

## 2021-07-03 DIAGNOSIS — Z85048 Personal history of other malignant neoplasm of rectum, rectosigmoid junction, and anus: Secondary | ICD-10-CM | POA: Diagnosis not present

## 2021-07-03 DIAGNOSIS — Z01818 Encounter for other preprocedural examination: Secondary | ICD-10-CM

## 2021-07-03 DIAGNOSIS — Z01812 Encounter for preprocedural laboratory examination: Secondary | ICD-10-CM | POA: Insufficient documentation

## 2021-07-03 DIAGNOSIS — Z801 Family history of malignant neoplasm of trachea, bronchus and lung: Secondary | ICD-10-CM | POA: Diagnosis not present

## 2021-07-03 DIAGNOSIS — K9419 Other complications of enterostomy: Secondary | ICD-10-CM | POA: Diagnosis not present

## 2021-07-03 DIAGNOSIS — Z20822 Contact with and (suspected) exposure to covid-19: Secondary | ICD-10-CM | POA: Diagnosis not present

## 2021-07-03 DIAGNOSIS — G629 Polyneuropathy, unspecified: Secondary | ICD-10-CM | POA: Diagnosis not present

## 2021-07-03 DIAGNOSIS — Z800 Family history of malignant neoplasm of digestive organs: Secondary | ICD-10-CM | POA: Diagnosis not present

## 2021-07-03 DIAGNOSIS — C20 Malignant neoplasm of rectum: Secondary | ICD-10-CM | POA: Insufficient documentation

## 2021-07-03 DIAGNOSIS — Z87891 Personal history of nicotine dependence: Secondary | ICD-10-CM | POA: Diagnosis not present

## 2021-07-03 DIAGNOSIS — Z6372 Alcoholism and drug addiction in family: Secondary | ICD-10-CM | POA: Diagnosis not present

## 2021-07-03 DIAGNOSIS — Z841 Family history of disorders of kidney and ureter: Secondary | ICD-10-CM | POA: Diagnosis not present

## 2021-07-03 DIAGNOSIS — Z85038 Personal history of other malignant neoplasm of large intestine: Secondary | ICD-10-CM | POA: Diagnosis not present

## 2021-07-03 DIAGNOSIS — Z8371 Family history of colonic polyps: Secondary | ICD-10-CM | POA: Diagnosis not present

## 2021-07-03 DIAGNOSIS — E781 Pure hyperglyceridemia: Secondary | ICD-10-CM | POA: Diagnosis not present

## 2021-07-03 DIAGNOSIS — Z803 Family history of malignant neoplasm of breast: Secondary | ICD-10-CM | POA: Diagnosis not present

## 2021-07-03 DIAGNOSIS — Z811 Family history of alcohol abuse and dependence: Secondary | ICD-10-CM | POA: Diagnosis not present

## 2021-07-03 DIAGNOSIS — Z8249 Family history of ischemic heart disease and other diseases of the circulatory system: Secondary | ICD-10-CM | POA: Diagnosis not present

## 2021-07-03 DIAGNOSIS — Z432 Encounter for attention to ileostomy: Secondary | ICD-10-CM | POA: Diagnosis not present

## 2021-07-03 DIAGNOSIS — Z818 Family history of other mental and behavioral disorders: Secondary | ICD-10-CM | POA: Diagnosis not present

## 2021-07-03 LAB — SARS CORONAVIRUS 2 (TAT 6-24 HRS): SARS Coronavirus 2: NEGATIVE

## 2021-07-03 LAB — CBC
HCT: 45.9 % (ref 39.0–52.0)
Hemoglobin: 15.7 g/dL (ref 13.0–17.0)
MCH: 31.7 pg (ref 26.0–34.0)
MCHC: 34.2 g/dL (ref 30.0–36.0)
MCV: 92.5 fL (ref 80.0–100.0)
Platelets: 306 10*3/uL (ref 150–400)
RBC: 4.96 MIL/uL (ref 4.22–5.81)
RDW: 13.3 % (ref 11.5–15.5)
WBC: 4.5 10*3/uL (ref 4.0–10.5)
nRBC: 0 % (ref 0.0–0.2)

## 2021-07-04 ENCOUNTER — Telehealth: Payer: Self-pay | Admitting: Podiatry

## 2021-07-04 NOTE — Telephone Encounter (Signed)
Called patient, patient will call back in one week to schedule an appointment to pick up orthotics due to his recent health conditions.

## 2021-07-05 ENCOUNTER — Inpatient Hospital Stay (HOSPITAL_COMMUNITY): Payer: BC Managed Care – PPO | Admitting: Registered Nurse

## 2021-07-05 ENCOUNTER — Inpatient Hospital Stay (HOSPITAL_COMMUNITY)
Admission: RE | Admit: 2021-07-05 | Discharge: 2021-07-07 | DRG: 331 | Disposition: A | Discharge status: Home / Self Care | Payer: BC Managed Care – PPO | Source: Ambulatory Visit | Attending: Surgery | Admitting: Surgery

## 2021-07-05 ENCOUNTER — Other Ambulatory Visit: Payer: Self-pay

## 2021-07-05 ENCOUNTER — Encounter (HOSPITAL_COMMUNITY)
Admission: RE | Disposition: A | Discharge status: Home / Self Care | Payer: Self-pay | Source: Ambulatory Visit | Attending: Surgery

## 2021-07-05 DIAGNOSIS — Z85038 Personal history of other malignant neoplasm of large intestine: Secondary | ICD-10-CM | POA: Diagnosis not present

## 2021-07-05 DIAGNOSIS — Z20822 Contact with and (suspected) exposure to covid-19: Secondary | ICD-10-CM | POA: Diagnosis present

## 2021-07-05 DIAGNOSIS — Z811 Family history of alcohol abuse and dependence: Secondary | ICD-10-CM

## 2021-07-05 DIAGNOSIS — Z9889 Other specified postprocedural states: Secondary | ICD-10-CM | POA: Diagnosis present

## 2021-07-05 DIAGNOSIS — Z85048 Personal history of other malignant neoplasm of rectum, rectosigmoid junction, and anus: Secondary | ICD-10-CM

## 2021-07-05 DIAGNOSIS — Z01818 Encounter for other preprocedural examination: Secondary | ICD-10-CM

## 2021-07-05 DIAGNOSIS — Z800 Family history of malignant neoplasm of digestive organs: Secondary | ICD-10-CM | POA: Diagnosis not present

## 2021-07-05 DIAGNOSIS — Z8249 Family history of ischemic heart disease and other diseases of the circulatory system: Secondary | ICD-10-CM | POA: Diagnosis not present

## 2021-07-05 DIAGNOSIS — C20 Malignant neoplasm of rectum: Secondary | ICD-10-CM

## 2021-07-05 DIAGNOSIS — Z8042 Family history of malignant neoplasm of prostate: Secondary | ICD-10-CM

## 2021-07-05 DIAGNOSIS — Z6372 Alcoholism and drug addiction in family: Secondary | ICD-10-CM | POA: Diagnosis not present

## 2021-07-05 DIAGNOSIS — Z841 Family history of disorders of kidney and ureter: Secondary | ICD-10-CM

## 2021-07-05 DIAGNOSIS — Z801 Family history of malignant neoplasm of trachea, bronchus and lung: Secondary | ICD-10-CM

## 2021-07-05 DIAGNOSIS — Z803 Family history of malignant neoplasm of breast: Secondary | ICD-10-CM

## 2021-07-05 DIAGNOSIS — Z818 Family history of other mental and behavioral disorders: Secondary | ICD-10-CM | POA: Diagnosis not present

## 2021-07-05 DIAGNOSIS — Z8371 Family history of colonic polyps: Secondary | ICD-10-CM | POA: Diagnosis not present

## 2021-07-05 DIAGNOSIS — G629 Polyneuropathy, unspecified: Secondary | ICD-10-CM | POA: Diagnosis present

## 2021-07-05 DIAGNOSIS — Z432 Encounter for attention to ileostomy: Secondary | ICD-10-CM | POA: Diagnosis present

## 2021-07-05 DIAGNOSIS — E781 Pure hyperglyceridemia: Secondary | ICD-10-CM | POA: Diagnosis present

## 2021-07-05 DIAGNOSIS — Z87891 Personal history of nicotine dependence: Secondary | ICD-10-CM | POA: Diagnosis not present

## 2021-07-05 HISTORY — PX: ILEOSTOMY CLOSURE: SHX1784

## 2021-07-05 LAB — TYPE AND SCREEN
ABO/RH(D): A POS
Antibody Screen: NEGATIVE

## 2021-07-05 SURGERY — CLOSURE, ILEOSTOMY
Anesthesia: General

## 2021-07-05 MED ORDER — BUPIVACAINE LIPOSOME 1.3 % IJ SUSP: 20.0000 mL | Freq: Once | INTRAMUSCULAR | Status: DC

## 2021-07-05 MED ORDER — HYDROMORPHONE HCL 1 MG/ML IJ SOLN
0.2500 mg | INTRAMUSCULAR | Status: DC | PRN
  Administered 2021-07-05: 0.5 mg via INTRAVENOUS

## 2021-07-05 MED ORDER — HEPARIN SODIUM (PORCINE) 5000 UNIT/ML IJ SOLN
5000.0000 [IU] | Freq: Three times a day (TID) | INTRAMUSCULAR | Status: DC
  Administered 2021-07-06 – 2021-07-07 (×4): 5000 [IU] via SUBCUTANEOUS
  Filled 2021-07-05 (×4): qty 1

## 2021-07-05 MED ORDER — LACTATED RINGERS IV SOLN: INTRAVENOUS | Status: DC

## 2021-07-05 MED ORDER — FENTANYL CITRATE PF 50 MCG/ML IJ SOSY
PREFILLED_SYRINGE | INTRAMUSCULAR | Status: AC
  Administered 2021-07-05: 50 ug via INTRAVENOUS
  Filled 2021-07-05: qty 3

## 2021-07-05 MED ORDER — ROCURONIUM BROMIDE 10 MG/ML (PF) SYRINGE
PREFILLED_SYRINGE | INTRAVENOUS | Status: AC
  Filled 2021-07-05: qty 10

## 2021-07-05 MED ORDER — CEFAZOLIN SODIUM-DEXTROSE 2-4 GM/100ML-% IV SOLN
2.0000 g | INTRAVENOUS | Status: AC
  Administered 2021-07-05: 2 g via INTRAVENOUS
  Filled 2021-07-05: qty 100

## 2021-07-05 MED ORDER — ONDANSETRON HCL 4 MG/2ML IJ SOLN
4.0000 mg | Freq: Four times a day (QID) | INTRAMUSCULAR | Status: DC | PRN
Start: 2021-07-05 — End: 2021-07-07

## 2021-07-05 MED ORDER — PROMETHAZINE HCL 25 MG/ML IJ SOLN
INTRAMUSCULAR | Status: AC
  Administered 2021-07-05: 6.25 mg via INTRAVENOUS
  Filled 2021-07-05: qty 1

## 2021-07-05 MED ORDER — MIDAZOLAM HCL 2 MG/2ML IJ SOLN
INTRAMUSCULAR | Status: AC
  Filled 2021-07-05: qty 2

## 2021-07-05 MED ORDER — OXYCODONE HCL 5 MG PO TABS: 5.0000 mg | ORAL_TABLET | Freq: Once | ORAL | Status: DC | PRN

## 2021-07-05 MED ORDER — FENTANYL CITRATE PF 50 MCG/ML IJ SOSY
25.0000 ug | PREFILLED_SYRINGE | INTRAMUSCULAR | Status: DC | PRN
  Administered 2021-07-05 (×2): 50 ug via INTRAVENOUS

## 2021-07-05 MED ORDER — SIMETHICONE 80 MG PO CHEW
40.0000 mg | CHEWABLE_TABLET | Freq: Four times a day (QID) | ORAL | Status: DC | PRN
  Administered 2021-07-06: 40 mg via ORAL
  Filled 2021-07-05: qty 1

## 2021-07-05 MED ORDER — BUPIVACAINE LIPOSOME 1.3 % IJ SUSP
INTRAMUSCULAR | Status: DC | PRN
  Administered 2021-07-05: 20 mL

## 2021-07-05 MED ORDER — LIDOCAINE 2% (20 MG/ML) 5 ML SYRINGE
INTRAMUSCULAR | Status: DC | PRN
  Administered 2021-07-05: 60 mg via INTRAVENOUS

## 2021-07-05 MED ORDER — PROPOFOL 10 MG/ML IV BOLUS
INTRAVENOUS | Status: AC
  Filled 2021-07-05: qty 20

## 2021-07-05 MED ORDER — SUGAMMADEX SODIUM 200 MG/2ML IV SOLN
INTRAVENOUS | Status: DC | PRN
  Administered 2021-07-05: 200 mg via INTRAVENOUS

## 2021-07-05 MED ORDER — FENTANYL CITRATE (PF) 100 MCG/2ML IJ SOLN
INTRAMUSCULAR | Status: AC
Start: 2021-07-05 — End: ?
  Filled 2021-07-05: qty 2

## 2021-07-05 MED ORDER — DEXAMETHASONE SODIUM PHOSPHATE 10 MG/ML IJ SOLN
INTRAMUSCULAR | Status: AC
  Filled 2021-07-05: qty 1

## 2021-07-05 MED ORDER — ACETAMINOPHEN 500 MG PO TABS: 1000.0000 mg | ORAL_TABLET | Freq: Once | ORAL | Status: AC

## 2021-07-05 MED ORDER — 0.9 % SODIUM CHLORIDE (POUR BTL) OPTIME
TOPICAL | Status: DC | PRN
Start: 2021-07-05 — End: 2021-07-05
  Administered 2021-07-05: 1000 mL

## 2021-07-05 MED ORDER — CHLORHEXIDINE GLUCONATE 0.12 % MT SOLN: 15.0000 mL | Freq: Once | OROMUCOSAL | Status: DC

## 2021-07-05 MED ORDER — DIPHENHYDRAMINE HCL 12.5 MG/5ML PO ELIX: 12.5000 mg | ORAL_SOLUTION | Freq: Four times a day (QID) | ORAL | Status: DC | PRN

## 2021-07-05 MED ORDER — PROMETHAZINE HCL 25 MG/ML IJ SOLN
6.2500 mg | INTRAMUSCULAR | Status: AC | PRN
  Administered 2021-07-05: 6.25 mg via INTRAVENOUS

## 2021-07-05 MED ORDER — HYDROMORPHONE HCL 1 MG/ML IJ SOLN
INTRAMUSCULAR | Status: AC
  Filled 2021-07-05: qty 1

## 2021-07-05 MED ORDER — FENTANYL CITRATE (PF) 100 MCG/2ML IJ SOLN
INTRAMUSCULAR | Status: DC | PRN
  Administered 2021-07-05 (×4): 50 ug via INTRAVENOUS

## 2021-07-05 MED ORDER — TRAMADOL HCL 50 MG PO TABS
50.0000 mg | ORAL_TABLET | Freq: Four times a day (QID) | ORAL | Status: DC | PRN
  Administered 2021-07-05 – 2021-07-07 (×5): 50 mg via ORAL
  Filled 2021-07-05 (×5): qty 1

## 2021-07-05 MED ORDER — ENSURE SURGERY PO LIQD
237.0000 mL | Freq: Two times a day (BID) | ORAL | Status: DC
  Administered 2021-07-06: 237 mL via ORAL

## 2021-07-05 MED ORDER — BUPIVACAINE LIPOSOME 1.3 % IJ SUSP
INTRAMUSCULAR | Status: AC
  Filled 2021-07-05: qty 20

## 2021-07-05 MED ORDER — ONDANSETRON HCL 4 MG/2ML IJ SOLN
INTRAMUSCULAR | Status: AC
  Filled 2021-07-05: qty 2

## 2021-07-05 MED ORDER — DIPHENHYDRAMINE HCL 50 MG/ML IJ SOLN: 12.5000 mg | Freq: Four times a day (QID) | INTRAMUSCULAR | Status: DC | PRN

## 2021-07-05 MED ORDER — HYDRALAZINE HCL 20 MG/ML IJ SOLN: 10.0000 mg | INTRAMUSCULAR | Status: DC | PRN

## 2021-07-05 MED ORDER — IBUPROFEN 400 MG PO TABS
600.0000 mg | ORAL_TABLET | Freq: Four times a day (QID) | ORAL | Status: DC | PRN
  Administered 2021-07-05 – 2021-07-07 (×3): 600 mg via ORAL
  Filled 2021-07-05 (×3): qty 1

## 2021-07-05 MED ORDER — LIDOCAINE HCL (PF) 2 % IJ SOLN
INTRAMUSCULAR | Status: AC
  Filled 2021-07-05: qty 5

## 2021-07-05 MED ORDER — DEXAMETHASONE SODIUM PHOSPHATE 10 MG/ML IJ SOLN
INTRAMUSCULAR | Status: DC | PRN
  Administered 2021-07-05: 8 mg via INTRAVENOUS

## 2021-07-05 MED ORDER — ALVIMOPAN 12 MG PO CAPS
12.0000 mg | ORAL_CAPSULE | ORAL | Status: AC
  Administered 2021-07-05: 12 mg via ORAL
  Filled 2021-07-05: qty 1

## 2021-07-05 MED ORDER — ONDANSETRON HCL 4 MG PO TABS: 4.0000 mg | ORAL_TABLET | Freq: Four times a day (QID) | ORAL | Status: DC | PRN

## 2021-07-05 MED ORDER — BUPIVACAINE-EPINEPHRINE (PF) 0.25% -1:200000 IJ SOLN
INTRAMUSCULAR | Status: DC | PRN
  Administered 2021-07-05: 30 mL

## 2021-07-05 MED ORDER — ALPRAZOLAM 0.25 MG PO TABS: 0.2500 mg | ORAL_TABLET | Freq: Three times a day (TID) | ORAL | Status: DC | PRN

## 2021-07-05 MED ORDER — ACETAMINOPHEN 500 MG PO TABS
1000.0000 mg | ORAL_TABLET | Freq: Four times a day (QID) | ORAL | Status: DC
  Administered 2021-07-05 – 2021-07-07 (×6): 1000 mg via ORAL
  Filled 2021-07-05 (×6): qty 2

## 2021-07-05 MED ORDER — HYDROMORPHONE HCL 1 MG/ML IJ SOLN
0.5000 mg | INTRAMUSCULAR | Status: DC | PRN
  Administered 2021-07-05 (×2): 0.5 mg via INTRAVENOUS
  Filled 2021-07-05 (×2): qty 0.5

## 2021-07-05 MED ORDER — ROCURONIUM BROMIDE 10 MG/ML (PF) SYRINGE
PREFILLED_SYRINGE | INTRAVENOUS | Status: DC | PRN
  Administered 2021-07-05: 10 mg via INTRAVENOUS
  Administered 2021-07-05: 50 mg via INTRAVENOUS

## 2021-07-05 MED ORDER — OXYCODONE HCL 5 MG/5ML PO SOLN: 5.0000 mg | Freq: Once | ORAL | Status: DC | PRN

## 2021-07-05 MED ORDER — CELECOXIB 200 MG PO CAPS
200.0000 mg | ORAL_CAPSULE | Freq: Once | ORAL | Status: AC
  Administered 2021-07-05: 200 mg via ORAL
  Filled 2021-07-05: qty 1

## 2021-07-05 MED ORDER — TAMSULOSIN HCL 0.4 MG PO CAPS
0.4000 mg | ORAL_CAPSULE | Freq: Every day | ORAL | Status: DC
  Administered 2021-07-05 – 2021-07-06 (×2): 0.4 mg via ORAL
  Filled 2021-07-05 (×2): qty 1

## 2021-07-05 MED ORDER — ALVIMOPAN 12 MG PO CAPS
12.0000 mg | ORAL_CAPSULE | Freq: Two times a day (BID) | ORAL | Status: DC
  Administered 2021-07-06: 12 mg via ORAL
  Filled 2021-07-05: qty 1

## 2021-07-05 MED ORDER — VITAMIN B-12 1000 MCG PO TABS
1000.0000 ug | ORAL_TABLET | Freq: Every day | ORAL | Status: DC
  Administered 2021-07-06 – 2021-07-07 (×2): 1000 ug via ORAL
  Filled 2021-07-05 (×2): qty 1

## 2021-07-05 MED ORDER — ALUM & MAG HYDROXIDE-SIMETH 200-200-20 MG/5ML PO SUSP: 30.0000 mL | Freq: Four times a day (QID) | ORAL | Status: DC | PRN

## 2021-07-05 MED ORDER — PROPOFOL 10 MG/ML IV BOLUS
INTRAVENOUS | Status: DC | PRN
  Administered 2021-07-05: 200 mg via INTRAVENOUS

## 2021-07-05 MED ORDER — ORAL CARE MOUTH RINSE: 15.0000 mL | Freq: Once | OROMUCOSAL | Status: DC

## 2021-07-05 MED ORDER — ACETAMINOPHEN 500 MG PO TABS
1000.0000 mg | ORAL_TABLET | ORAL | Status: AC
  Administered 2021-07-05: 1000 mg via ORAL
  Filled 2021-07-05: qty 2

## 2021-07-05 MED ORDER — MIDAZOLAM HCL 5 MG/5ML IJ SOLN
INTRAMUSCULAR | Status: DC | PRN
  Administered 2021-07-05: 2 mg via INTRAVENOUS

## 2021-07-05 MED ORDER — ONDANSETRON HCL 4 MG/2ML IJ SOLN
INTRAMUSCULAR | Status: DC | PRN
  Administered 2021-07-05: 4 mg via INTRAVENOUS

## 2021-07-05 MED ORDER — BUPIVACAINE-EPINEPHRINE (PF) 0.25% -1:200000 IJ SOLN
INTRAMUSCULAR | Status: AC
  Filled 2021-07-05: qty 30

## 2021-07-05 MED ORDER — BUSPIRONE HCL 5 MG PO TABS
7.5000 mg | ORAL_TABLET | Freq: Two times a day (BID) | ORAL | Status: DC
  Administered 2021-07-05 – 2021-07-07 (×4): 7.5 mg via ORAL
  Filled 2021-07-05 (×4): qty 2

## 2021-07-05 SURGICAL SUPPLY — 50 items
APL PRP STRL LF DISP 70% ISPRP (MISCELLANEOUS)
BAG COUNTER SPONGE SURGICOUNT (BAG) IMPLANT
BAG SPNG CNTER NS LX DISP (BAG)
BLADE HEX COATED 2.75 (ELECTRODE) ×2 IMPLANT
BNDG GAUZE ELAST 4 BULKY (GAUZE/BANDAGES/DRESSINGS) ×1 IMPLANT
CHLORAPREP W/TINT 26 (MISCELLANEOUS) ×1 IMPLANT
COVER MAYO STAND STRL (DRAPES) ×2 IMPLANT
DRAPE LAPAROSCOPIC ABDOMINAL (DRAPES) ×2 IMPLANT
DRAPE UTILITY XL STRL (DRAPES) IMPLANT
DRAPE WARM FLUID 44X44 (DRAPES) ×1 IMPLANT
DRSG OPSITE POSTOP 4X10 (GAUZE/BANDAGES/DRESSINGS) IMPLANT
DRSG OPSITE POSTOP 4X6 (GAUZE/BANDAGES/DRESSINGS) IMPLANT
DRSG OPSITE POSTOP 4X8 (GAUZE/BANDAGES/DRESSINGS) IMPLANT
DRSG TELFA 3X8 NADH (GAUZE/BANDAGES/DRESSINGS) IMPLANT
ELECT REM PT RETURN 15FT ADLT (MISCELLANEOUS) ×2 IMPLANT
GAUZE SPONGE 4X4 12PLY STRL (GAUZE/BANDAGES/DRESSINGS) ×2 IMPLANT
GLOVE SURG POLYISO LF SZ7 (GLOVE) ×2 IMPLANT
GLOVE SURG UNDER POLY LF SZ7 (GLOVE) ×2 IMPLANT
GOWN STRL REUS W/TWL LRG LVL3 (GOWN DISPOSABLE) ×2 IMPLANT
GOWN STRL REUS W/TWL XL LVL3 (GOWN DISPOSABLE) IMPLANT
HANDLE SUCTION POOLE (INSTRUMENTS) ×1 IMPLANT
HOLDER FOLEY CATH W/STRAP (MISCELLANEOUS) IMPLANT
KIT BASIN OR (CUSTOM PROCEDURE TRAY) ×2 IMPLANT
KIT TURNOVER KIT A (KITS) ×1 IMPLANT
MANIFOLD NEPTUNE II (INSTRUMENTS) ×2 IMPLANT
PACK GENERAL/GYN (CUSTOM PROCEDURE TRAY) ×2 IMPLANT
PAD DRESSING TELFA 3X8 NADH (GAUZE/BANDAGES/DRESSINGS) IMPLANT
RELOAD PROXIMATE 75MM BLUE (ENDOMECHANICALS) ×6 IMPLANT
RELOAD STAPLE 75 3.8 BLU REG (ENDOMECHANICALS) IMPLANT
SPONGE T-LAP 18X18 ~~LOC~~+RFID (SPONGE) ×2 IMPLANT
STAPLER GUN LINEAR PROX 60 (STAPLE) ×1 IMPLANT
STAPLER PROXIMATE 75MM BLUE (STAPLE) ×1 IMPLANT
SUCTION POOLE HANDLE (INSTRUMENTS) ×2
SUT NOVA NAB DX-16 0-1 5-0 T12 (SUTURE) IMPLANT
SUT NOVA NAB GS-21 0 18 T12 DT (SUTURE) ×4 IMPLANT
SUT PROLENE 2 0 BLUE (SUTURE) IMPLANT
SUT SILK 2 0 (SUTURE) ×2
SUT SILK 2 0 SH CR/8 (SUTURE) ×2 IMPLANT
SUT SILK 2-0 18XBRD TIE 12 (SUTURE) ×1 IMPLANT
SUT SILK 3 0 (SUTURE) ×2
SUT SILK 3 0 SH CR/8 (SUTURE) ×3 IMPLANT
SUT SILK 3-0 18XBRD TIE 12 (SUTURE) ×1 IMPLANT
SUT VIC AB 2-0 SH 18 (SUTURE) IMPLANT
SUT VIC AB 2-0 SH 27 (SUTURE) ×6
SUT VIC AB 2-0 SH 27X BRD (SUTURE) ×2 IMPLANT
SUT VIC AB 4-0 PS2 18 (SUTURE) ×2 IMPLANT
TOWEL OR 17X26 10 PK STRL BLUE (TOWEL DISPOSABLE) ×4 IMPLANT
TOWEL OR NON WOVEN STRL DISP B (DISPOSABLE) ×4 IMPLANT
TRAY FOLEY MTR SLVR 16FR STAT (SET/KITS/TRAYS/PACK) ×1 IMPLANT
YANKAUER SUCT BULB TIP NO VENT (SUCTIONS) ×2 IMPLANT

## 2021-07-05 NOTE — Discharge Instructions (Signed)
POST OP INSTRUCTIONS AFTER COLON SURGERY  DIET: Be sure to include lots of fluids daily to stay hydrated - 64oz of water per day (8, 8 oz glasses).  Avoid fast food or heavy meals for the first couple of weeks as your are more likely to get nauseated. Avoid raw/uncooked fruits or vegetables for the first 4 weeks (its ok to have these if they are blended into smoothie form). If you have fruits/vegetables, make sure they are cooked until soft enough to mash on the roof of your mouth and chew your food well. Otherwise, diet as tolerated.  Take your usually prescribed home medications unless otherwise directed.  PAIN CONTROL: Pain is best controlled by a usual combination of three different methods TOGETHER: Ice/Heat Over the counter pain medication Prescription pain medication Most patients will experience some swelling and bruising around the surgical site.  Ice packs or heating pads (30-60 minutes up to 6 times a day) will help. Some people prefer to use ice alone, heat alone, alternating between ice & heat.  Experiment to what works for you.  Swelling and bruising can take several weeks to resolve.   It is helpful to take an over-the-counter pain medication regularly for the first few weeks: Ibuprofen (Motrin/Advil) - 200mg  tabs - take 3 tabs (600mg ) every 6 hours as needed for pain (unless you have been directed previously to avoid NSAIDs/ibuprofen) Acetaminophen (Tylenol) - you may take 650mg  every 6 hours as needed. You can take this with motrin as they act differently on the body. If you are taking a narcotic pain medication that has acetaminophen in it, do not take over the counter tylenol at the same time. NOTE: You may take both of these medications together - most patients  find it most helpful when alternating between the two (i.e. Ibuprofen at 6am, tylenol at 9am, ibuprofen at 12pm ..[place] Kitchen) A  prescription for pain medication should be given to you upon discharge.  Take your pain medication as  prescribed if your pain is not adequatly controlled with the over-the-counter pain reliefs mentioned above.  Avoid getting constipated.  Between the surgery and the pain medications, it is common to experience some constipation.  Increasing fluid intake and taking a fiber supplement (such as Metamucil, Citrucel, FiberCon, MiraLax, etc) 1-2 times a day regularly will usually help prevent this problem from occurring.  A mild laxative (prune juice, Milk of Magnesia, MiraLax, etc) should be taken according to package directions if there are no bowel movements after 48 hours.    Dressing: Cover your ileostomy takedown site with gauze, change 1-2x/day as needed. Ok to remove and bathe normally with soap/water over your wound. Pat dry with dark towel after and recover to protect your clothes   ACTIVITIES as tolerated:   Avoid heavy lifting (>10lbs or 1 gallon of milk) for the next 6 weeks. You may resume regular daily activities as tolerated--such as daily self-care, walking, climbing stairs--gradually increasing activities as tolerated.  If you can walk 30 minutes without difficulty, it is safe to try more intense activity such as jogging, treadmill, bicycling, low-impact aerobics.  DO NOT PUSH THROUGH PAIN.  Let pain be your guide: If it hurts to do something, don't do it. You may drive when you are no longer taking prescription pain medication, you can comfortably wear a seatbelt, and you can safely maneuver your car and apply brakes.  FOLLOW UP in our office Please call [facility] at [phone] to set up an appointment to see your  surgeon in the office for a follow-up appointment approximately 2 weeks after your surgery. Make sure that you call for this appointment the day you arrive home to insure a convenient appointment time.  9. If you have disability or family leave forms that need to be completed, you may have them completed by your primary care physician's office; for return to work instructions,  please ask our office staff and they will be happy to assist you in obtaining this documentation   When to call us [phone]: Poor pain control Reactions / problems with new medications (rash/itching, etc)  Fever over 101.5 F (38.5 C) Inability to urinate Nausea/vomiting Worsening swelling or bruising Continued bleeding from incision. Increased pain, redness, or drainage from the incision  The clinic staff is available to answer your questions during regular business hours (8:30am-5pm).  Please dont hesitate to call and ask to speak to one of our nurses for clinical concerns.   A surgeon from [facility] is always on call at the hospitals   If you have a medical emergency, go to the nearest emergency room or call 911.  [facility], [place] [address], [place], [place], [place]  [address] MAIN: [phone] FAX: [phone] [email or web address]

## 2021-07-05 NOTE — Op Note (Signed)
07/05/2021  3:32 PM  PATIENT:  Robert Hatfield  51 y.o. male  Patient Care Team: Rita Ohara, MD as PCP - General (Family Medicine)  PRE-OPERATIVE DIAGNOSIS:  Ileostomy status, history of colorectal cancer  POST-OPERATIVE DIAGNOSIS:  Same  PROCEDURE:  Takedown of diverting loop ileostomy with small bowel resection  SURGEON:  Sharon Mt. Keeon Zurn, MD  ASSISTANT: Leighton Ruff, MD  ANESTHESIA:   local and general  COUNTS:  Sponge, needle and instrument counts were reported correct x2 at the conclusion of the operation.  EBL: 50 mL  DRAINS: None  SPECIMEN: Ileostomy  COMPLICATIONS: None  FINDINGS: Loop ileostomy with fairly tight fascial defect; no parastomal hernia; could have had sliding mesentery at the defect to explain his observation. Otherwise, loop ileostomy takendown uneventfully with small bowel-small bowel anastomosis.  DISPOSITION: PACU in satisfactory condition  DESCRIPTION: The patient was identified in preop holding and taken to the OR where he was placed on the operating room table. SCDs were placed. General endotracheal anesthesia was induced without difficulty. The ileostomy was sutured closed. He was then prepped and draped in the usual sterile fashion. A surgical timeout was performed indicating the correct patient, procedure, positioning and need for preoperative antibiotics.   Local anesthetic was infiltrated around the ileostomy.  The ileostomy was then circumferentially incised sharply.  The subcutaneous tissue was then divided electrocautery.  The ileostomy was circumferentially freed from the surrounding subcutaneous fat.  The rectus fascia was identified medially.  The ostomy was then circumferentially mobilized from this.  This was done using Metzenbaum scissors.  We were able to free this up circumferentially.  The peritoneum was entered.  There were no significant occasions around his ostomy site.  The fascia is quite tight and there is no apparent  parastomal hernia.  I suspect some of his symptoms may have been related to sliding mesentery with his ileostomy but not actually a parastomal hernia.  The bowel was then circumferentially inspected both on the proximal and distal limbs.  There are no evident serosal tears or injuries.  Windows were then created in each respective limb on the mesentery and the proximal and distal limbs of the ileostomy are then divided using a 75 mm GIA blue load stapler.  The intervening mesentery is clamped and tied and resected.  The ileostomy is passed off the specimen.  Orientation is confirmed such that there is no twisting of the proximal and distal limbs of the small bowel.  Distally, were well away from his ileocecal valve.  Attention is then turned to creating the ileoileal anastomosis.  This is mobile and easily reaches well up above his wound.  Windows were created in the antimesenteric side of each respective limb.  A 75 mm blue load stapler was then used to create a side-to-side, functional end-to-end small bowel anastomosis.  The staple line is inspected and noted to be hemostatic.  The common enterotomy is then elevated with Allis clamps and closed using a TA 60 blue load stapler.  The anastomosis is palpated and noted to be widely patent, 3 fingerbreadths.  A 3-0 silk stitch is placed at the apex of the anastomosis.  The corners of each respective TA staple line are then "dunked" using 3-0 silk suture.  The mesenteric defect is then closed using interrupted 3-0 silk sutures.  Both limbs of the bowel are reinspected and noted to be well perfused in appearance.  All staple lines are intact.  In order to reduce this back into the abdomen,  we do have to widen the fascial defect both medially and laterally.  The small bowel is then placed back into the abdomen.  Attention is then turned to closing the fascia.  This is closed transversely as this results in the least amount of tension.  This was done using 2 running #1  PDS sutures.  Additional local anesthetic is infiltrated.  All sponge, needle, and instrument counts were reported correct.  The skin at the site was then closed using a pursestring stitch of 2-0 Vicryl suture.  The subcutaneous tissue was then loosely packed with a moist Kerlix gauze.  Additional 4 x 4's, ABD, and tape is placed as a dressing.  He was then awakened from anesthesia, extubated, and transferred to a stretcher for transport to PACU in satisfactory condition.  Of note, given his history of postoperative urinary retention and baseline use of Flomax, we did opt to place a Foley catheter at completion with plans to leave overnight tonight.

## 2021-07-05 NOTE — H&P (Signed)
CC: Here today for flexible sigmoidoscopy, preop evaluation/planning prior to ileostomy takedown  HPI: Robert Hatfield is an 51 y.o. male whom underwent his first colonoscopy with Dr. Henrene Pastor 11/16/2020.  Findings: 1. 8 polyps in the transverse and ascending colon removed Tubular adenomas 2. 2 cm polyp in the sigmoid at 30 cm, removed. Tubular ad.enoma 3. Nonobstructing mass in the descending colon at approximately 40 cm measuring 2.5 x 1.5 cm. This was biopsied and tattooed - appears to be distal and at mass. Biopsy shows adenocarcinoma 4. Large lateral spreading mass in the rectum beginning at 4 cm from the anal verge that is completely circumferential, bulky, but soft. Extends proximal to about 15 cm. Biopsied. Area was tattooed proximally. Superficial fragments of TVA with focal HGD 5. Diverticulosis.  CT chest/abdomen/pelvis 11/27/2020 shows a 7 mm low attenuating lesion in segment 4, too small to characterize. Slight liver steatosis.  MRI pelvis 12/11/2020: Rectal mass cmriT3b/c;N0; 6.4 cm from IAS. No EMVI or levator involvement. ?threatened CRMs on left  Genetics evaluation - CancerNext-Expanded genel panel through [facility] - negative  Repeat flex sig with addn'l biopsies 12/22/20 -demonstrated from the proximal edge of the biopsy focal invasive adenocarcinoma arising in a tubulovillous adenoma with high-grade dysplasia. Lymphovascular invasion is present. Distal rectal tumor biopsy demonstrated tubulovillous adenoma.   Case presented at [facility] with myself and Drs. Sherrill and [place] present. Given invasive adenocarcinoma findings, LVI, and additionally an MRI showing lesion to be cmriT3b/c proceeding with at least up front cXRT. This would provide local treatment to the rectal cancer which upfront would be more beneficial than after surgery particularly with suggestion of threatened CRMs on MRI; additionally from functional standpoint would be ideal than radiating his colorectal anastomosis and  neorectal conduit; subsequently surgery which would involve most likely a left hemicolectomy with low anterior resection, diverting loop ileostomy, possible takedown of splenic flexure.   cXRT 01/08/21 - 02/14/21  OR 05/03/21 -  1. Robotic assisted ultra-low anterior resection with double stapled colo-anal anastomosis 2. Diagnostic flexible sigmoidoscopy (necessary to delineate distal extent of lesion) 3. Diverting loop ileostomy 4. Intraoperative assessment of perfusion (ICG)  FINDINGS: Tattoo in proximal sigmoid and rectum. Flexible sigmoidoscopy demonstrates that within the rectum there has been at least partial treatment response. Extending down to approximately 4-5 cm from verge, there is now islands of neoplasia. At the location of the more proximal tattoo, there is an apparent small polypoid mass/lesion that is concordant with the endoscopic photos taken by Dr. Henrene Pastor. This is also at the location of the tattoo. A low anterior resection is able to be carried out encompassing both of these lesions. In order to ensure an adequate distal margin, an ultralow anterior resection was utilized. A double stapled essentially coloanal anastomosis was fashioned about 2 cm above the anal verge.   No obvious metastatic disease on visceral parietal peritoneum or liver.    Elongated mesentery of both small bowel and colon.   A well perfused, tension-free, hemostatic, airtight 29 mm double stapled coloanal anastomosis was fashioned approximately 2 cm from the anal verge.   He was admitted postoperatively and recovered uneventfully. He was discharged home on POD#4. Returns for follow-up.  PATHOLOGY SURGICAL PATHOLOGY  * THIS IS AN ADDENDUM REPORT  CASE: [id number]  PATIENT: Robert Hatfield  Surgical Pathology Report  *Addendum   Reason for Addendum #1:  DNA Mismatch Repair IHC Results   Clinical History: Rectal mass, descending colon cancer (crm)   FINAL MICROSCOPIC DIAGNOSIS:   A.  COLON,  RECTOSIGMOID, RESECTION:  - Invasive poorly differentiated adenocarcinoma, 3.9 cm from the  proximal resection margin.  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  - Thirty nine lymph nodes, negative for metastatic carcinoma.  - See Oncology Table below.   B. FINAL DISTAL MARGIN:  - Segment of colon, negative for malignancy.   He followed up with Dr. Benay Spice 04/2021. He returns to our office to follow-up with me. He has been doing reasonably well. He is tolerating a diet without any difficulty. He denies any issues nausea, vomiting, fever, chills. He has some discomfort at the ileostomy site but the ostomy has been working well. Output is generally well controlled and thicker when he takes fiber and Imodium. Thinner if he does not. He has been working to stay hydrated without any significant difficulty.    Here today for flex sig. Has been doing reasonably well. Denies any changes in health/health hx since we met in office 05/28/21.  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 Nelva Bush)   Cancer [facility])    colorectal   Depression 2000-2002, 2015   treated with Zoloft and Klonopin x 1-2 yrs; recurrent in 2015   Family history of breast cancer 12/18/2020   Family history of colon cancer 12/18/2020   Family history of colonic polyps 12/18/2020   Family history of lung cancer 12/18/2020   Family history of prostate cancer 12/18/2020   Neuromuscular disorder ([place])    bil feet   Peripheral neuropathy    Personal history of colonic polyps 12/18/2020   Pneumonia    as a child   Pure hyperglyceridemia    Umbilical hernia     Past Surgical History:  Procedure Laterality Date   COLONOSCOPY     DEBRIDEMENT TENNIS ELBOW Right 2000   DIVERTING ILEOSTOMY N/A 05/03/2021    Procedure: DIVERTING LOOP ILEOSTOMY;  Surgeon: Ileana Roup, MD;  Location: [facility];  Service: General;  Laterality: N/A;   FLEXIBLE SIGMOIDOSCOPY N/A 05/03/2021   Procedure: Beryle Quant;  Surgeon: Ileana Roup, MD;  Location: [facility];  Service: General;  Laterality: N/A;   FLEXIBLE SIGMOIDOSCOPY N/A 06/04/2021   Procedure: FLEXIBLE SIGMOIDOSCOPY;  Surgeon: Ileana Roup, MD;  Location: [facility];  Service: General;  Laterality: N/A;   HUMERUS FRACTURE SURGERY Right    age 39   KNEE ARTHROSCOPY Left    age 72-20   KNEE ARTHROSCOPY Right    age [age]   SIGMOIDOSCOPY  12/22/2020   TONSILLECTOMY  child   XI ROBOTIC ASSISTED LOWER ANTERIOR RESECTION N/A 05/03/2021   Procedure: XI ROBOTIC ASSISTED LOWER ANTERIOR RESECTION, COLOANAL ANASTAMOSIS, INTRAOPERATIVE ASSESSMENT OF PERFUSION USING FIREFLY, BILATERAL TAP BLOCK;  Surgeon: Ileana Roup, MD;  Location: [facility];  Service: General;  Laterality: N/A;    Family History  Problem Relation Age of Onset   Depression Mother    Lung cancer Mother        smoking hx   Alcoholism Mother    Colon cancer Father        dx after 75   Colon polyps Father    Hypertension Father    Prostate cancer Father        dx  64s   AAA (abdominal aortic aneurysm) Father        repaired   Kidney disease Father        s/p AAA repair; on dialysis   Lung cancer Father        dx after 82; smoking hx   Colon polyps Sister 9       unknown number   Depression Brother    Colon polyps Brother        unknown number   Lung cancer Paternal Uncle        smoking hx   Colon cancer Paternal Grandmother        dx after 68   Breast cancer Paternal Grandmother        dx after 32   Diabetes Neg Hx    Heart disease Neg Hx    Stomach cancer Neg Hx    Rectal cancer Neg Hx     Social:  reports that he quit smoking about 18 years ago. His smoking use included cigarettes. He has never used smokeless tobacco. He reports that he  does not currently use alcohol. He reports that he does not currently use drugs. Frequency: 2.00 times per week.  Allergies: No Known Allergies  Medications: I have reviewed the patient's current medications.  No results found for this or any previous visit (from the past 48 hour(s)).   No results found.  ROS - all of the below systems have been reviewed with the patient and positives are indicated with bold text General: chills, fever or night sweats Eyes: blurry vision or double vision ENT: epistaxis or sore throat Allergy/Immunology: itchy/watery eyes or nasal congestion Hematologic/Lymphatic: bleeding problems, blood clots or swollen lymph nodes Endocrine: temperature intolerance or unexpected weight changes Breast: new or changing breast lumps or nipple discharge Resp: cough, shortness of breath, or wheezing CV: chest pain or dyspnea on exertion GI: as per HPI GU: dysuria, trouble voiding, or hematuria MSK: joint pain or joint stiffness Neuro: TIA or stroke symptoms Derm: pruritus and skin lesion changes Psych: anxiety and depression  PE Blood pressure 118/75, pulse 64, temperature (!) 97.4 F (36.3 C), temperature source Oral, resp. rate 20, height _0  (1.854 m), weight 80.7 kg, SpO2 100 %. Constitutional: NAD; conversant Eyes: Moist conjunctiva; no lid lag; anicteric; PERRL Lungs: Normal respiratory effort CV: RRR GI: Abd soft, NT/ND MSK: Normal range of motion of extremities Psychiatric: Appropriate affect; alert and oriented x3  No results found for this or any previous visit (from the past 48 hour(s)).   No results found.   A/P: Robert Hatfield is an 51 y.o. male s/p robotic LAR/CAA/DLI 05/03/21 for both rectal cancer + descending colon cancer - final path following cxrt showed dysplastic rectal mass without evident residual adenocarcinoma; poorly differentiated - ypT1N0M0  -Flex sig and GGE clear  -He understands the anatomy as it pertains to his current.  We have reviewed ileostomy reversal and what this entails including the technical aspects -The planned procedure, material risks (including, but not limited to, pain, bleeding, infection, scarring, need for blood transfusion, damage to surrounding structures- blood vessels/nerves/viscus/organs, leak from anastomosis, need for additional procedures, additional stomas, low anterior resection syndrome, worsening of pre-existing medical conditions, hernia, recurrence, pneumonia, heart attack, stroke, death) benefits and alternatives to surgery were discussed at length. The patient's questions were answered to their satisfaction, they voiced understanding and elected to proceed with surgery. Additionally, we discussed typical postoperative expectations and the recovery process.  Nadeen Landau, MD FACS  [facility], [place] [facility]

## 2021-07-05 NOTE — Anesthesia Preprocedure Evaluation (Addendum)
Anesthesia Evaluation  Patient identified by MRN, date of birth, ID band Patient awake    Reviewed: Allergy & Precautions, H&P , NPO status , Patient's Chart, lab work & pertinent test results  History of Anesthesia Complications Negative for: history of anesthetic complications  Airway Mallampati: III  TM Distance: >3 FB Neck ROM: Full  Mouth opening: Limited Mouth Opening  Dental  (+) Caps, Teeth Intact   Pulmonary asthma , former smoker,    Pulmonary exam normal        Cardiovascular Exercise Tolerance: Good negative cardio ROS Normal cardiovascular exam     Neuro/Psych PSYCHIATRIC DISORDERS Anxiety Depression  Neuromuscular disease    GI/Hepatic Neg liver ROS,  Ileostomy    Endo/Other  negative endocrine ROS  Renal/GU negative Renal ROS  negative genitourinary   Musculoskeletal  (+) Arthritis , Osteoarthritis,    Abdominal   Peds negative pediatric ROS (+)  Hematology negative hematology ROS (+)   Anesthesia Other Findings   Reproductive/Obstetrics negative OB ROS                            Anesthesia Physical  Anesthesia Plan  ASA: 2  Anesthesia Plan: General   Post-op Pain Management: Tylenol PO (pre-op)* and Celebrex PO (pre-op)*   Induction: Intravenous  PONV Risk Score and Plan: 2 and Ondansetron, Dexamethasone, Treatment may vary due to age or medical condition and Midazolam  Airway Management Planned: Oral ETT and Video Laryngoscope Planned  Additional Equipment: None  Intra-op Plan:   Post-operative Plan: Extubation in OR  Informed Consent: I have reviewed the patients History and Physical, chart, labs and discussed the procedure including the risks, benefits and alternatives for the proposed anesthesia with the patient or authorized representative who has indicated his/her understanding and acceptance.     Dental advisory given  Plan Discussed with:  Anesthesiologist and CRNA  Anesthesia Plan Comments: (  )       Anesthesia Quick Evaluation

## 2021-07-05 NOTE — Anesthesia Postprocedure Evaluation (Signed)
Anesthesia Post Note  Patient: Robert Hatfield  Procedure(s) Performed: loop ILEOSTOMY TAKEDOWN     Patient location during evaluation: PACU Anesthesia Type: General Level of consciousness: awake and alert Pain management: pain level controlled Vital Signs Assessment: post-procedure vital signs reviewed and stable Respiratory status: spontaneous breathing, nonlabored ventilation and respiratory function stable Cardiovascular status: stable and blood pressure returned to baseline Anesthetic complications: no   No notable events documented.  Last Vitals:  Vitals:   07/05/21 1548 07/05/21 1755  BP: (!) 157/80 (!) 141/81  Pulse: 75 72  Resp: 18 18  Temp: 37.2 C   SpO2: 100% 100%    Last Pain:  Vitals:   07/05/21 1548  TempSrc: Oral  PainSc:                  Audry Pili

## 2021-07-05 NOTE — Transfer of Care (Signed)
Immediate Anesthesia Transfer of Care Note  Patient: Robert Hatfield  Procedure(s) Performed: loop ILEOSTOMY TAKEDOWN  Patient Location: PACU  Anesthesia Type:General  Level of Consciousness: awake, alert , oriented and patient cooperative  Airway & Oxygen Therapy: Patient Spontanous Breathing and Patient connected to face mask oxygen  Post-op Assessment: Report given to RN, Post -op Vital signs reviewed and stable and Patient moving all extremities  Post vital signs: Reviewed and stable  Last Vitals:  Vitals Value Taken Time  BP 141/80 07/05/21 1417  Temp    Pulse 79 07/05/21 1420  Resp 14 07/05/21 1420  SpO2 100 % 07/05/21 1420  Vitals shown include unvalidated device data.  Last Pain:  Vitals:   07/05/21 1057  TempSrc: Oral         Complications: No notable events documented.

## 2021-07-05 NOTE — Anesthesia Procedure Notes (Signed)
Procedure Name: Intubation Date/Time: 07/05/2021 12:43 PM Performed by: Victoriano Lain, CRNA Pre-anesthesia Checklist: Patient identified, Emergency Drugs available, Suction available, Patient being monitored and Timeout performed Patient Re-evaluated:Patient Re-evaluated prior to induction Oxygen Delivery Method: Circle system utilized Preoxygenation: Pre-oxygenation with 100% oxygen Induction Type: IV induction Ventilation: Mask ventilation without difficulty Laryngoscope Size: Glidescope and 3 Grade View: Grade I Tube type: Parker flex tip Tube size: 7.5 mm Number of attempts: 1 Airway Equipment and Method: Video-laryngoscopy and Stylet Placement Confirmation: ETT inserted through vocal cords under direct vision, positive ETCO2 and breath sounds checked- equal and bilateral Secured at: 22 cm Tube secured with: Tape Dental Injury: Teeth and Oropharynx as per pre-operative assessment  Comments: Elective Glidescope intubation due to prior intubation with a Grade 3 view with a MAC 3. ATOI. +ETCO2. BBS=

## 2021-07-06 ENCOUNTER — Encounter (HOSPITAL_COMMUNITY): Payer: Self-pay | Admitting: Surgery

## 2021-07-06 LAB — CBC
HCT: 39.8 % (ref 39.0–52.0)
Hemoglobin: 13.4 g/dL (ref 13.0–17.0)
MCH: 30.5 pg (ref 26.0–34.0)
MCHC: 33.7 g/dL (ref 30.0–36.0)
MCV: 90.5 fL (ref 80.0–100.0)
Platelets: 271 10*3/uL (ref 150–400)
RBC: 4.4 MIL/uL (ref 4.22–5.81)
RDW: 13.1 % (ref 11.5–15.5)
WBC: 8.3 10*3/uL (ref 4.0–10.5)
nRBC: 0 % (ref 0.0–0.2)

## 2021-07-06 LAB — BASIC METABOLIC PANEL
Anion gap: 6 (ref 5–15)
BUN: 9 mg/dL (ref 6–20)
CO2: 26 mmol/L (ref 22–32)
Calcium: 8.6 mg/dL — ABNORMAL LOW (ref 8.9–10.3)
Chloride: 103 mmol/L (ref 98–111)
Creatinine, Ser: 0.71 mg/dL (ref 0.61–1.24)
GFR, Estimated: >60 mL/min (ref >60)
Glucose, Bld: 124 mg/dL — ABNORMAL HIGH (ref 70–99)
Potassium: 3.8 mmol/L (ref 3.5–5.1)
Sodium: 135 mmol/L (ref 135–145)

## 2021-07-06 MED ORDER — TRAMADOL HCL 50 MG PO TABS
50.0000 mg | ORAL_TABLET | Freq: Four times a day (QID) | ORAL | 0 refills | Status: AC | PRN
  Filled 2021-07-06: qty 20, 5d supply, fill #0

## 2021-07-06 MED ORDER — ZINC OXIDE 11.3 % EX CREA
TOPICAL_CREAM | Freq: Two times a day (BID) | CUTANEOUS | Status: DC | PRN
  Administered 2021-07-06: 1 via TOPICAL
  Filled 2021-07-06: qty 56

## 2021-07-06 NOTE — Progress Notes (Signed)
°  Subjective No acute events. Feeling reasonably well. No n/v. Tolerating liquids and some apple sauce. Pain control satisfactory.  Objective: Vital signs in last 24 hours: Temp:  [97.4 F (36.3 C)-98.9 F (37.2 C)] 98.1 F (36.7 C) (02/17 0518) Pulse Rate:  [64-94] 86 (02/17 0518) Resp:  [8-20] 16 (02/17 0518) BP: (118-157)/(67-92) 136/82 (02/17 0518) SpO2:  [95 %-100 %] 98 % (02/17 0518) Weight:  [80.7 kg-81.6 kg] 81.6 kg (02/17 0518) Last BM Date : 07/04/21  Intake/Output from previous day: 02/16 0701 - 02/17 0700 In: 2492.9 [P.O.:480; I.V.:1912.9; IV Piggyback:100] Out: 2800 [Urine:2750; Blood:50] Intake/Output this shift: No intake/output data recorded.  Gen: NAD, comfortable CV: RRR Pulm: Normal work of breathing Abd: Soft, appropriate incisional tenderness. Nondistended. Wound dressed/dry. Ext: SCDs in place  Lab Results: CBC  Recent Labs    07/03/21 0847 07/06/21 0420  WBC 4.5 8.3  HGB 15.7 13.4  HCT 45.9 39.8  PLT 306 271   BMET Recent Labs    07/06/21 0420  NA 135  K 3.8  CL 103  CO2 26  GLUCOSE 124*  BUN 9  CREATININE 0.71  CALCIUM 8.6*   PT/INR No results for input(s): LABPROT, INR in the last 72 hours. ABG No results for input(s): PHART, HCO3 in the last 72 hours.  Invalid input(s): PCO2, PO2  Studies/Results:  Anti-infectives: Anti-infectives (From admission, onward)    Start     Dose/Rate Route Frequency Ordered Stop   07/05/21 1045  ceFAZolin (ANCEF) IVPB 2g/100 mL premix        2 g 200 mL/hr over 30 Minutes Intravenous On call to O.R. 07/05/21 1044 07/05/21 1314        Assessment/Plan: Patient Active Problem List   Diagnosis Date Noted   S/P closure of ileostomy 07/05/2021   Rectal cancer ([place]) 05/03/2021   Genetic testing 12/29/2020   Adenocarcinoma of rectum ([place]) 12/21/2020   Family history of colon cancer 12/18/2020   Personal history of colonic polyps 12/18/2020   Family history of colonic polyps 12/18/2020    Family history of breast cancer 12/18/2020   Family history of prostate cancer 12/18/2020   Family history of lung cancer 12/18/2020   Adenocarcinoma of colon ([place]) 11/22/2020   B12 deficiency 11/22/2020   Vitamin D deficiency 11/22/2020   Ventral hernia without obstruction or gangrene 63/05/6008   Umbilical hernia without obstruction or gangrene 05/30/2014   Depressive disorder, not elsewhere classified 12/15/2013   Anxiety state, unspecified 04/09/2012   Bulging discs 06/17/2011   Cervicalgia 06/17/2011   s/p Procedure(s): loop ILEOSTOMY TAKEDOWN 07/05/2021  -Doing quite well; reviewed procedure, findings, plans -Ambulate 5x/day -FLD, ADAT -D/C IVF -Foley out -Ppx: SQH, SCDs   LOS: 1 day    Nadeen Landau, MD [facility]

## 2021-07-06 NOTE — Progress Notes (Signed)
Transition of Care [facility] Screening Note  Patient Details  Name: Robert Hatfield Date of Birth: [birth year]  Transition of Care [facility]) CM/SW Contact:    Sherie Don, LCSW Phone Number: 07/06/2021, 11:37 AM  Transition of Care Department [facility]) has reviewed patient and no TOC needs have been identified at this time. We will continue to monitor patient advancement through interdisciplinary progression rounds. If new patient transition needs arise, please place a TOC consult.

## 2021-07-07 ENCOUNTER — Other Ambulatory Visit (HOSPITAL_COMMUNITY): Payer: Self-pay

## 2021-07-07 LAB — CBC
HCT: 40 % (ref 39.0–52.0)
Hemoglobin: 13.3 g/dL (ref 13.0–17.0)
MCH: 30.7 pg (ref 26.0–34.0)
MCHC: 33.3 g/dL (ref 30.0–36.0)
MCV: 92.4 fL (ref 80.0–100.0)
Platelets: 258 10*3/uL (ref 150–400)
RBC: 4.33 MIL/uL (ref 4.22–5.81)
RDW: 13.4 % (ref 11.5–15.5)
WBC: 5.5 10*3/uL (ref 4.0–10.5)
nRBC: 0 % (ref 0.0–0.2)

## 2021-07-07 LAB — BASIC METABOLIC PANEL
Anion gap: 6 (ref 5–15)
BUN: 12 mg/dL (ref 6–20)
CO2: 28 mmol/L (ref 22–32)
Calcium: 8.6 mg/dL — ABNORMAL LOW (ref 8.9–10.3)
Chloride: 104 mmol/L (ref 98–111)
Creatinine, Ser: 0.81 mg/dL (ref 0.61–1.24)
GFR, Estimated: >60 mL/min (ref >60)
Glucose, Bld: 97 mg/dL (ref 70–99)
Potassium: 3.5 mmol/L (ref 3.5–5.1)
Sodium: 138 mmol/L (ref 135–145)

## 2021-07-07 NOTE — Progress Notes (Signed)
Assessment unchanged. Pt verbalized understanding of dc instructions through teach back. Discharged via wc to front entrance accompanied by NT.

## 2021-07-07 NOTE — Plan of Care (Signed)

## 2021-07-07 NOTE — Discharge Summary (Signed)
Physician Discharge Summary  Robert Hatfield:[id number] DOB: 28-Oct-1970 DOA: 07/05/2021  PCP: Rita Ohara, MD  Admit date: 07/05/2021 Discharge date: 07/07/2021  Recommendations for Outpatient Follow-up:   (include homehealth, outpatient follow-up instructions, specific recommendations for PCP to follow-up on, etc.)   Follow-up Information     Ileana Roup, MD Follow up in 2 week(s).   Specialties: General Surgery, Colon and Rectal Surgery Contact information: [address] [place] [address] [phone]                Discharge Diagnoses:  Principal Problem:   S/P closure of ileostomy   Surgical Procedure: ileostomy reversal  Discharge Condition: Good Disposition: Home  Diet recommendation: reg diet   Hospital Course:  51 yo male presented for ileostomy reversal. He did well and had return of bowel function and was discharged home POD 2.  Discharge Instructions  Discharge Instructions     Call MD for:  persistant nausea and vomiting   Complete by: As directed    Call MD for:  severe uncontrolled pain   Complete by: As directed    Call MD for:  temperature >100.4   Complete by: As directed    Diet - low sodium heart healthy   Complete by: As directed    Discharge wound care:   Complete by: As directed    Daily dry gauze over wound until healed   Increase activity slowly   Complete by: As directed       Allergies as of 07/07/2021   No Known Allergies      Medication List     TAKE these medications    acetaminophen 500 MG tablet Commonly known as: TYLENOL Take 1,000 mg by mouth every 8 (eight) hours as needed for moderate pain.   ALPRAZolam 0.5 MG tablet Commonly known as: XANAX Take 0.5-1 tablets (0.25-0.5 mg total) by mouth 3 (three) times daily as needed for anxiety.   busPIRone 15 MG tablet Commonly known as: BUSPAR Start 1/2 tablet twice daily, and increase as directed, if needed, to full tablet  twice daily   cyanocobalamin 1000 MCG tablet Take 1,000 mcg by mouth daily.   ibuprofen 200 MG tablet Commonly known as: ADVIL Take 400 mg by mouth every 8 (eight) hours as needed for moderate pain.   loperamide 2 MG capsule Commonly known as: IMODIUM Take 1 capsule (2 mg total) by mouth 2 (two) times daily before a meal. What changed:  when to take this reasons to take this   tamsulosin 0.4 MG Caps capsule Commonly known as: FLOMAX Take 1 capsule (0.4 mg total) by mouth daily after supper.   traMADol 50 MG tablet Commonly known as: Ultram Take 1 tablet (50 mg total) by mouth every 6 (six) hours as needed for up to 5 days (postop pain not controlled with tylenol and ibuprofen first).               Discharge Care Instructions  (From admission, onward)           Start     Ordered   07/07/21 0000  Discharge wound care:       Comments: Daily dry gauze over wound until healed   07/07/21 6283            Follow-up Information     Ileana Roup, MD Follow up in 2 week(s).   Specialties: General Surgery, Colon and Rectal Surgery Contact information: [address]  [place] [address] [phone]                  The results of significant diagnostics from this hospitalization (including imaging, microbiology, ancillary and laboratory) are listed below for reference.    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 EXAM: WATER SOLUBLE CONTRAST ENEMA TECHNIQUE: Scout image of the abdomen was first obtained. A Foley catheter was then introduced into the rectum after gentle rectal examination was performed. Balloon was inflated gently above the expected location of the anastomosis and retracted gently towards the pelvic floor. Final balloon volume in the range of 5-7 cc.  The patient was instructed to maintain rectal tone during the examination to prevent leakage of contrast which was successful. FLUOROSCOPY TIME:  Fluoroscopy Time:  2 minutes 30 seconds Radiation Exposure Index (if provided by the fluoroscopic device): 32.6 mGy Number of Acquired Spot Images: 6 COMPARISON:  Prior CT imaging from November of 2022. FINDINGS: Scout image with signs of anastomosis in the low pelvis and ileostomy in the RIGHT lower quadrant. After gentle insertion of the Foley catheter and gentle retraction to the pelvic floor dilute, water-soluble contrast was introduced via gravity infusion into the colon. Unfortunately due to the low level of the anastomosis distension of the anastomosis with initial colonic filling was difficult. The head of the patient's bed was elevated to allow for drainage and further distension of the anastomosis. With this there was no visible leak. The distal colon was of smaller caliber but remain patent without focal stricture. No contrast was seen passing outside of visible suture lines at the level of the anastomosis just above the pelvic floor. Patulous appearance is seen on oblique image on RF series "56". The remainder of the colon was grossly normal on water-soluble evaluation. There was some reflux of contrast into the terminal ileum and into the patient's ostomy. IMPRESSION: Low colorectal anastomosis just above the pelvic floor with mildly patulous appearance favored to represent postoperative change, no contrast extending beyond suture lines to indicate leak. Difficulty in distending this area likely due to low position of the anastomosis with preferential distension of more proximal colon during the evaluation. Attempts at further distension were made with the patient's head of bed elevated to approximately 30 degrees. Electronically Signed   By: Zetta Bills M.D.   On: 06/18/2021 12:47    Labs: Basic Metabolic Panel: Recent Labs  Lab 07/06/21 0420  07/07/21 0526  NA 135 138  K 3.8 3.5  CL 103 104  CO2 26 28  GLUCOSE 124* 97  BUN 9 12  CREATININE 0.71 0.81  CALCIUM 8.6* 8.6*   Liver Function Tests: No results for input(s): AST, ALT, ALKPHOS, BILITOT, PROT, ALBUMIN in the last 168 hours.  CBC: Recent Labs  Lab 07/03/21 0847 07/06/21 0420 07/07/21 0526  WBC 4.5 8.3 5.5  HGB 15.7 13.4 13.3  HCT 45.9 39.8 40.0  MCV 92.5 90.5 92.4  PLT 306 271 258    CBG: No results for input(s): GLUCAP in the last 168 hours.  Principal Problem:   S/P closure of ileostomy   Time coordinating discharge: 15 min

## 2021-07-09 LAB — SURGICAL PATHOLOGY

## 2021-08-02 ENCOUNTER — Other Ambulatory Visit: Payer: Self-pay | Admitting: Family Medicine

## 2021-08-02 NOTE — Telephone Encounter (Signed)
Is this okay to refill? 

## 2021-08-14 ENCOUNTER — Encounter: Payer: Self-pay | Admitting: Nurse Practitioner

## 2021-08-14 ENCOUNTER — Other Ambulatory Visit: Payer: Self-pay

## 2021-08-14 ENCOUNTER — Encounter: Payer: Self-pay | Admitting: *Deleted

## 2021-08-14 ENCOUNTER — Inpatient Hospital Stay: Payer: BC Managed Care – PPO | Attending: Oncology

## 2021-08-14 ENCOUNTER — Telehealth: Payer: Self-pay

## 2021-08-14 ENCOUNTER — Inpatient Hospital Stay (HOSPITAL_BASED_OUTPATIENT_CLINIC_OR_DEPARTMENT_OTHER): Payer: BC Managed Care – PPO | Admitting: Nurse Practitioner

## 2021-08-14 VITALS — BP 120/81 | HR 75 | Temp 98.4°F | Resp 20 | Ht 73.0 in | Wt 170.4 lb

## 2021-08-14 DIAGNOSIS — C20 Malignant neoplasm of rectum: Secondary | ICD-10-CM

## 2021-08-14 DIAGNOSIS — E538 Deficiency of other specified B group vitamins: Secondary | ICD-10-CM | POA: Diagnosis not present

## 2021-08-14 DIAGNOSIS — Z79899 Other long term (current) drug therapy: Secondary | ICD-10-CM | POA: Diagnosis not present

## 2021-08-14 DIAGNOSIS — Z87891 Personal history of nicotine dependence: Secondary | ICD-10-CM | POA: Diagnosis not present

## 2021-08-14 DIAGNOSIS — Z923 Personal history of irradiation: Secondary | ICD-10-CM | POA: Diagnosis not present

## 2021-08-14 DIAGNOSIS — J449 Chronic obstructive pulmonary disease, unspecified: Secondary | ICD-10-CM | POA: Diagnosis not present

## 2021-08-14 DIAGNOSIS — C189 Malignant neoplasm of colon, unspecified: Secondary | ICD-10-CM

## 2021-08-14 LAB — CEA (ACCESS): CEA (CHCC): 1.92 ng/mL (ref 0.00–5.00)

## 2021-08-14 NOTE — Progress Notes (Signed)
Call to [facility] for pelvic rehab referral. Varney Biles will have patient called for appointment. They now have #4 therapists that provide pelvic rehab. ?

## 2021-08-14 NOTE — Progress Notes (Signed)
?  [facility] ?OFFICE PROGRESS NOTE ? ? ?Diagnosis: Rectal cancer ? ?INTERVAL HISTORY:  ? ?Robert Hatfield returns as scheduled.  He underwent ileostomy reversal 07/05/2021.  Bowel habits are "unpredictable".  He in general feels well.  He has a good appetite.  No nausea or vomiting.  He reports sexual dysfunction since the December surgery. ? ?Objective: ? ?Vital signs in last 24 hours: ? ?Blood pressure 120/81, pulse 75, temperature 98.4 ?F (36.9 ?C), temperature source Oral, resp. rate 20, height 6' 1" (1.854 m), weight 170 lb 6.4 oz (77.3 kg), SpO2 99 %. ?  ? ?HEENT: No thrush or ulcers. ?Lymphatics: No palpable cervical, supraclavicular, axillary or inguinal lymph nodes. ?Resp: Lungs clear bilaterally. ?Cardio: Regular rate and rhythm. ?GI: Abdomen soft and nontender.  No hepatosplenomegaly.  Right abdomen surgical incision with scab formation. ?Vascular: No leg edema. ? ?Lab Results: ? ?Lab Results  ?Component Value Date  ? WBC 5.5 07/07/2021  ? HGB 13.3 07/07/2021  ? HCT 40.0 07/07/2021  ? MCV 92.4 07/07/2021  ? PLT 258 07/07/2021  ? NEUTROABS 2.3 02/08/2021  ? ? ?Imaging: ? ?No results found. ? ?Medications: I have reviewed the patient's current medications. ? ?Assessment/Plan: ?Colon cancer ?Biopsy of a descending colon mass at 40 cm revealed adenocarcinoma, normal mismatch repair protein expression ?Low anterior resection 05/03/2021-grade 3 adenocarcinoma in the descending colon measuring 1.5 x 0.8 cm, no lymphovascular perineural invasion, negative margins, absent treatment effect, tumor invades submucosa, 0/39 lymph nodes, ypT1,ypN0 ?Rectal mass ?Circumferential mass beginning at 4 cm from the anal verge-biopsy tubulovillous adenoma with high-grade dysplasia ?CT 11/27/2020-indeterminate 7 mm segment 4 liver lesion, no other evidence of metastatic disease ?MRI pelvis 12/10/2020-tumor at 12.1 cm from the anal verge, 6.4 cm from the internal anal sphincter, T3b versus T3c, N0 ?Sigmoidoscopy-biopsy of  rectal mass 12/22/2020-distal edge biopsy, tubulovillous adenoma; proximal edge biopsy focal invasive adenocarcinoma arising in a tubulovillous adenoma with high-grade dysplasia, lymphovascular invasion present  ?Xeloda/radiation 01/08/2021-02/15/2021 ?CTs 04/09/2021-diminished size of intraluminal mass in the superior rectum, unchanged subcentimeter low-attenuation lesion in hepatic segment 4B, no evidence of metastatic disease ?Low anterior resection, diverting loop ileostomy 05/03/2021, tubulovillous adenoma with high-grade dysplasia 1.5 cm from the distal resection margin ?  ?3.  COPD ?4.  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  Family history of colon polyps ?6.  Multiple colon polyps on the colonoscopy 11/16/2020-tubular adenomas ?7.  Remote history of tobacco use ?8.  Remote history of heavy alcohol use ?9.  Vitamin B12 deficiency ?10.  Ileostomy reversal 07/05/2021 ?  ? ?Disposition: [name] is in clinical remission from colorectal cancer.  We will follow-up on the CEA from today.  Plan for surveillance CT scans in approximately 2 months. ? ?Referral made for pelvic physical therapy.  Referral made to urology for evaluation of sexual dysfunction. ? ?He will return for follow-up approximately 1 week after the CT scans to review results. ? ? ? ?Ned Card ANP/GNP-BC  ? ?08/14/2021  ?1:52 PM ? ? ? ? ? ? ? ?

## 2021-08-14 NOTE — Telephone Encounter (Signed)
Called the patient to let him know his CT scan appointment is schedule for 08/17/21 at 1 [facility], arrive at 1245, NPO 900, and drink bottle of the contrast at 11 and the other bottle at noon. ?

## 2021-08-15 ENCOUNTER — Encounter: Payer: Self-pay | Admitting: *Deleted

## 2021-08-15 NOTE — Progress Notes (Signed)
Faxed referral order, demographics, office note and med list to [facility] for consult with Dr. Denman George or [place]. ?Fax [phone] ?

## 2021-08-17 ENCOUNTER — Ambulatory Visit (HOSPITAL_BASED_OUTPATIENT_CLINIC_OR_DEPARTMENT_OTHER)
Admission: RE | Admit: 2021-08-17 | Discharge: 2021-08-17 | Disposition: A | Discharge status: Home / Self Care | Payer: BC Managed Care – PPO | Source: Ambulatory Visit | Attending: Nurse Practitioner | Admitting: Nurse Practitioner

## 2021-08-17 DIAGNOSIS — K769 Liver disease, unspecified: Secondary | ICD-10-CM | POA: Diagnosis not present

## 2021-08-17 DIAGNOSIS — C189 Malignant neoplasm of colon, unspecified: Secondary | ICD-10-CM | POA: Insufficient documentation

## 2021-08-17 DIAGNOSIS — C20 Malignant neoplasm of rectum: Secondary | ICD-10-CM | POA: Insufficient documentation

## 2021-08-17 DIAGNOSIS — J439 Emphysema, unspecified: Secondary | ICD-10-CM | POA: Diagnosis not present

## 2021-08-17 MED ORDER — IOHEXOL 300 MG/ML  SOLN
100.0000 mL | Freq: Once | INTRAMUSCULAR | Status: AC | PRN
  Administered 2021-08-17: 100 mL via INTRAVENOUS

## 2021-08-22 ENCOUNTER — Telehealth: Payer: Self-pay | Admitting: Internal Medicine

## 2021-08-22 ENCOUNTER — Telehealth: Payer: Self-pay

## 2021-08-22 MED ORDER — DIPHENOXYLATE-ATROPINE 2.5-0.025 MG PO TABS: 1.0000 | ORAL_TABLET | Freq: Four times a day (QID) | ORAL | 3 refills | Status: DC | PRN

## 2021-08-22 NOTE — Telephone Encounter (Signed)
Patient called requesting a script for Lomotil said he was traveling but has not been seen since 2022. Please advise. ?

## 2021-08-22 NOTE — Telephone Encounter (Signed)
Patient requesting a refill of Lomotil that I believe he was given in the hospital by Dr. Dema Severin.  Ok to refill? ?

## 2021-08-22 NOTE — Telephone Encounter (Signed)
Sent patient mychart message that Lomotil had been prescribed by Dr. Dema Severin.  He can ask him for a refill or I will send Dr. Henrene Pastor a message to see if he will refill it.  I asked him to let me know ?

## 2021-08-22 NOTE — Telephone Encounter (Signed)
Yes, okay to refill.  Thanks! 

## 2021-09-03 ENCOUNTER — Other Ambulatory Visit (HOSPITAL_COMMUNITY): Payer: Self-pay

## 2021-09-04 IMAGING — CT CT CHEST W/ CM
2 of 5 series · 12 of 36 positions shown, 15 images · IV contrast (APPLIED)
Comparison: None.

CLINICAL DATA: Colorectal cancer.

EXAM:
CT CHEST, ABDOMEN, AND PELVIS WITH CONTRAST
TECHNIQUE: Multidetector CT imaging of the chest, abdomen and pelvis was
performed following the standard protocol during bolus
administration of intravenous contrast.
CONTRAST:  80mL OMNIPAQUE IOHEXOL 350 MG/ML SOLN

[Series 2: cap with · axial · 0.75mm/px · z∈[-854,-319]mm · 9 of 135 slices shown, 12 images]
[im 14/135  mediastinal]
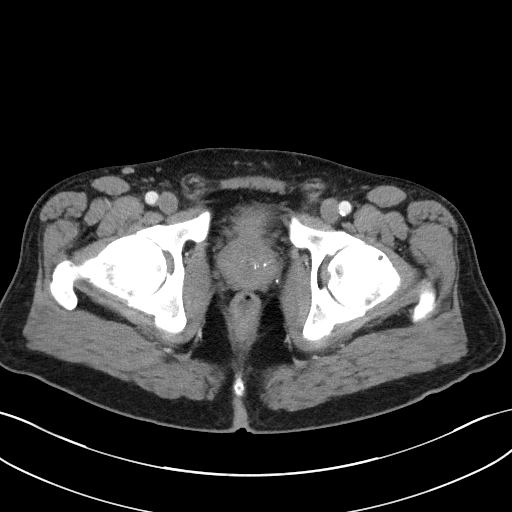
[im 14/135  lung]
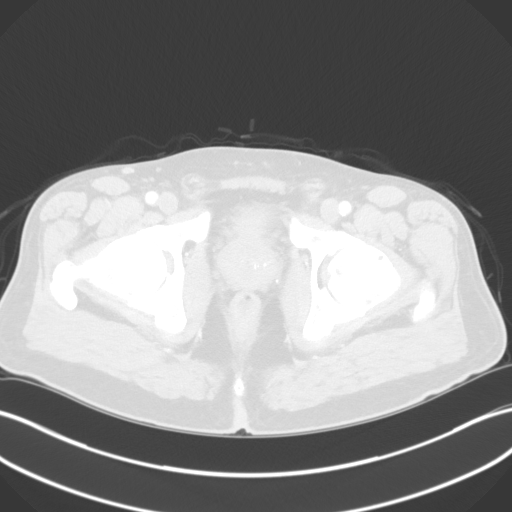
[im 27/135  lung]
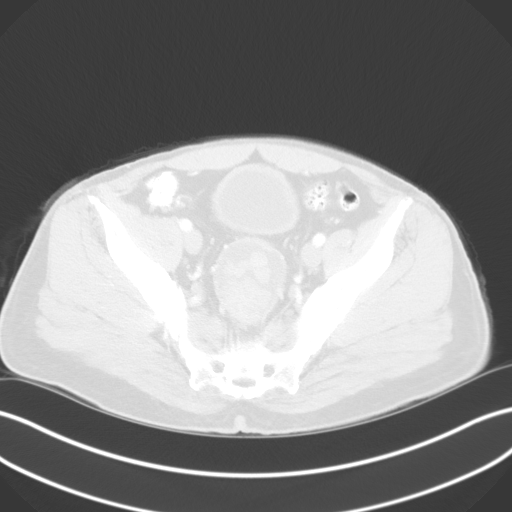
[im 41/135  lung]
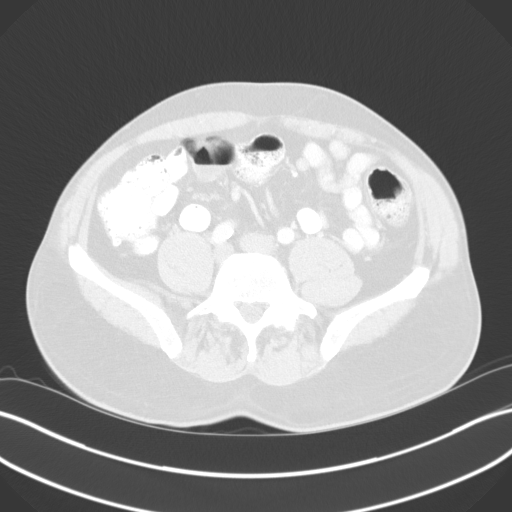
[im 54/135  lung]
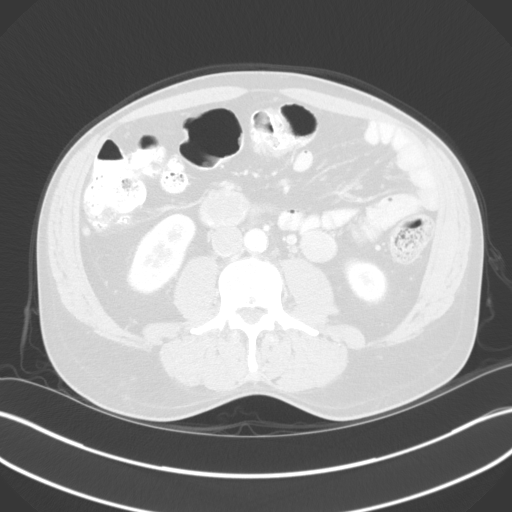
[im 68/135  mediastinal]
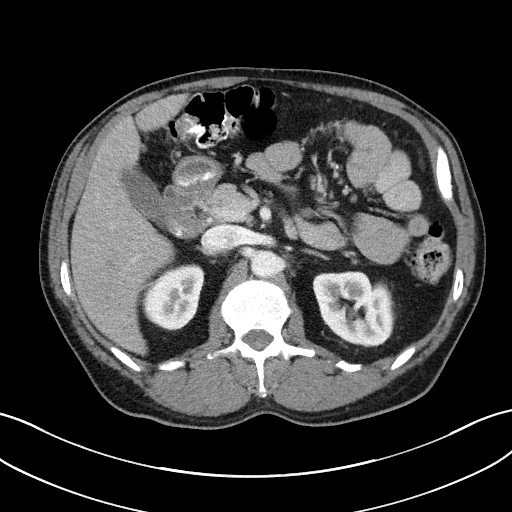
[im 68/135  lung]
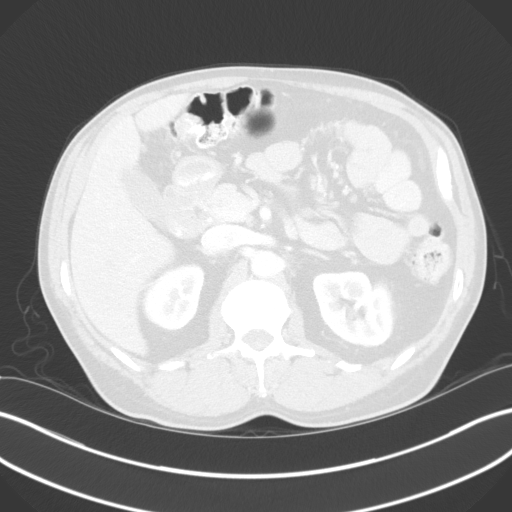
[im 81/135  lung]
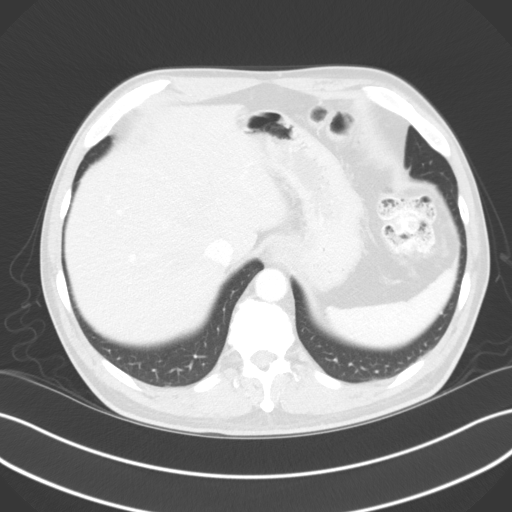
[im 94/135  lung]
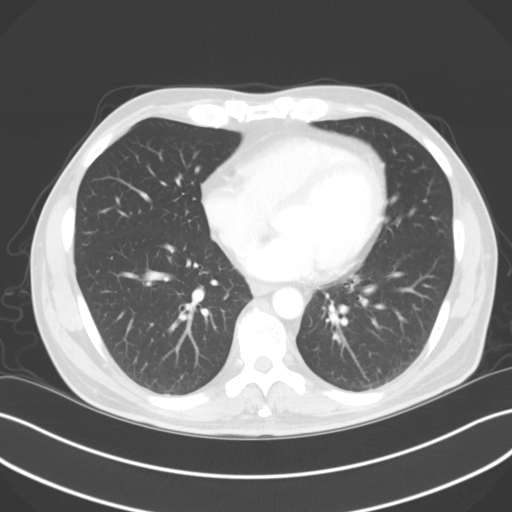
[im 108/135  lung]
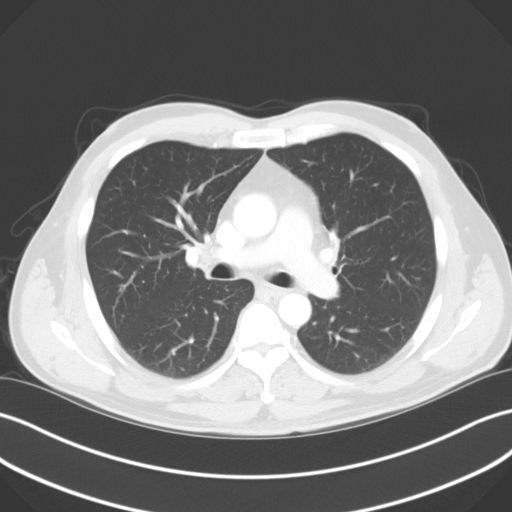
[im 121/135  mediastinal]
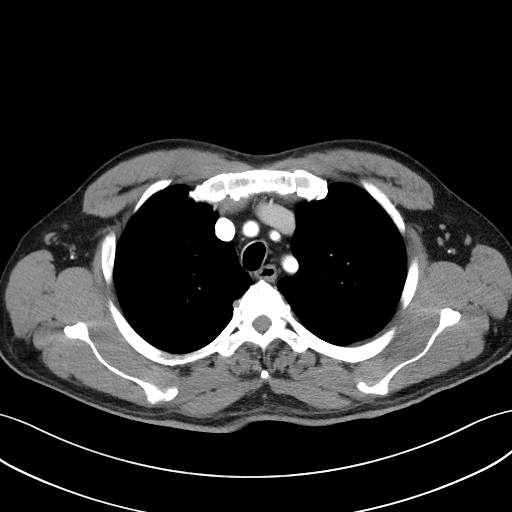
[im 121/135  lung]
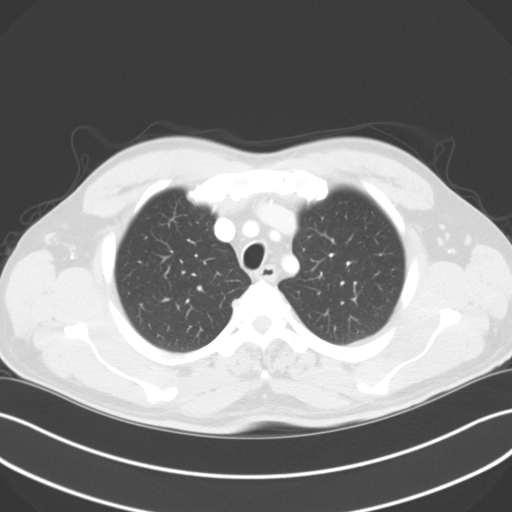

[Series 5: coronals · coronal · 0.76mm/px · 3 of 137 slices shown]
[im 28/137  lung]
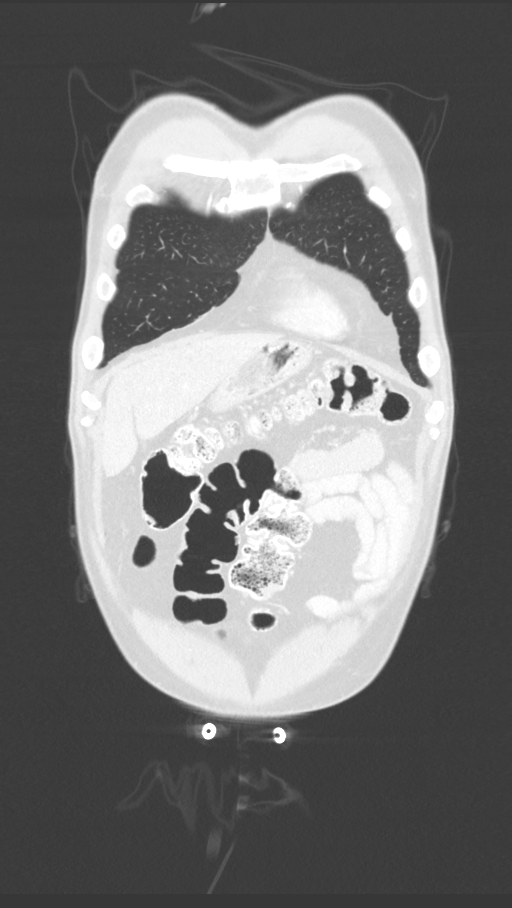
[im 55/137  lung]
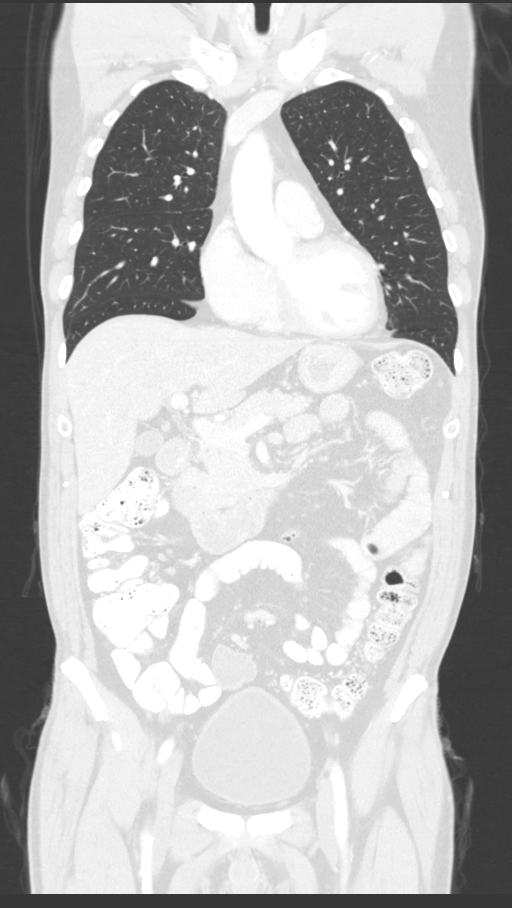
[im 82/137  lung]
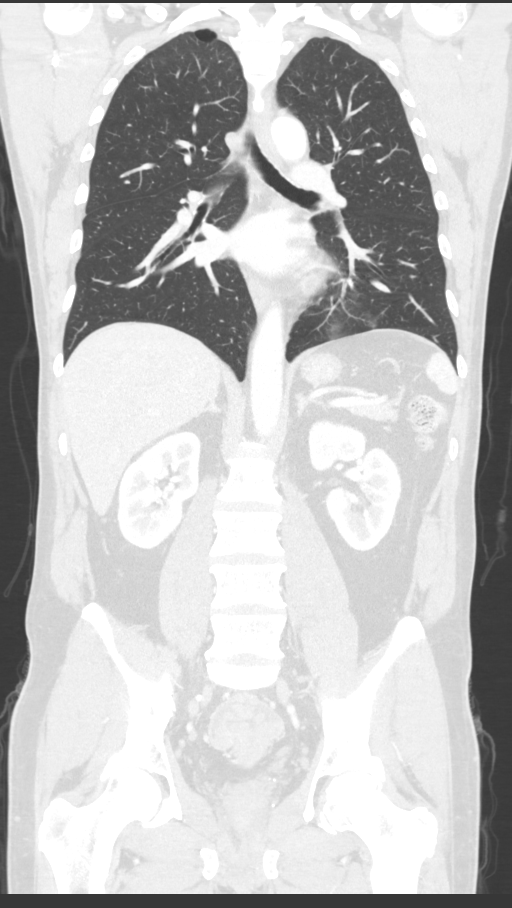

[12 of 36 positions shown; findings below may reference images not displayed]

FINDINGS: CT CHEST FINDINGS

Cardiovascular: Left anterior descending coronary artery
calcification. Heart is at the upper limits of normal in size. No
pericardial effusion.

Mediastinum/Nodes: No pathologically enlarged mediastinal, hilar or
axillary lymph nodes. Esophagus is unremarkable.

Lungs/Pleura: Mild paraseptal emphysema. Minimal dependent
atelectasis bilaterally. 3 mm subpleural left lower lobe nodule
(4/117), likely a benign subpleural lymph node. Lungs are otherwise
clear. No pleural fluid. Airway is unremarkable.

Musculoskeletal: Degenerative changes in the spine. No worrisome
lytic or sclerotic lesions.

CT ABDOMEN PELVIS FINDINGS

Hepatobiliary: Liver is minimally decreased in attenuation
diffusely. 7 mm low-attenuation lesion in segment 4 of the liver
(2/65), too small to characterize. Liver and gallbladder are
otherwise unremarkable. No biliary ductal dilatation.

Pancreas: Negative.

Spleen: Negative.

Adrenals/Urinary Tract: Adrenal glands and kidneys are unremarkable.
Ureters are decompressed. Bladder is grossly unremarkable.

Stomach/Bowel: Stomach, small bowel, appendix and majority of the
colon are unremarkable. Fluid and stool are seen in the rectosigmoid
colon.

Vascular/Lymphatic: Atherosclerotic calcification of the aorta. No
pathologically enlarged lymph nodes.

Reproductive: Prostate is normal in size.

Other: No free fluid. Mesenteries and peritoneum are unremarkable.
Tiny umbilical hernia contains fat.

Musculoskeletal: Mild degenerative changes in the spine and hips. No
worrisome lytic or sclerotic lesions.
IMPRESSION: 1. No definitive evidence of metastatic disease.
2. 7 mm low-attenuation lesion in segment 4 of the liver, too small
to characterize.
3. Liver appears slightly steatotic.
4. Aortic atherosclerosis (JX4PL-GR4.4). Left anterior descending
coronary artery calcification.
5.  Emphysema ([id number].P).

## 2021-09-10 DIAGNOSIS — N5201 Erectile dysfunction due to arterial insufficiency: Secondary | ICD-10-CM | POA: Diagnosis not present

## 2021-09-12 ENCOUNTER — Encounter: Payer: Self-pay | Admitting: *Deleted

## 2021-09-13 ENCOUNTER — Ambulatory Visit (INDEPENDENT_AMBULATORY_CARE_PROVIDER_SITE_OTHER): Payer: BC Managed Care – PPO

## 2021-09-13 ENCOUNTER — Encounter: Payer: Self-pay | Admitting: Podiatry

## 2021-09-13 ENCOUNTER — Ambulatory Visit: Payer: BC Managed Care – PPO

## 2021-09-13 ENCOUNTER — Ambulatory Visit (INDEPENDENT_AMBULATORY_CARE_PROVIDER_SITE_OTHER): Payer: BC Managed Care – PPO | Admitting: Podiatry

## 2021-09-13 DIAGNOSIS — M205X1 Other deformities of toe(s) (acquired), right foot: Secondary | ICD-10-CM | POA: Diagnosis not present

## 2021-09-13 DIAGNOSIS — M778 Other enthesopathies, not elsewhere classified: Secondary | ICD-10-CM | POA: Diagnosis not present

## 2021-09-13 MED ORDER — TRIAMCINOLONE ACETONIDE 10 MG/ML IJ SUSP
10.0000 mg | Freq: Once | INTRAMUSCULAR | Status: AC
  Administered 2021-09-13: 10 mg

## 2021-09-13 NOTE — Progress Notes (Signed)
G

## 2021-09-13 NOTE — Progress Notes (Signed)
SITUATION: ?Reason for Visit: Fitting and Delivery of Custom Fabricated Foot Orthoses ?Patient Report: Patient reports comfort and is satisfied with device. ? ?OBJECTIVE DATA: ?Patient History / Diagnosis:   ?  ICD-10-CM   ?1. Hallux limitus of right foot  M20.5X1   ?  ?2. Capsulitis of foot, right  M77.8   ?  ? ? ?Provided Device:  Custom Functional Foot Orthotics ?    [facility]: [id number] ? ?GOAL OF ORTHOSIS ?- Improve gait ?- Decrease energy expenditure ?- Improve Balance ?- Provide Triplanar stability of foot complex ?- Facilitate motion ? ?ACTIONS PERFORMED ?Patient was fit with foot orthotics trimmed to shoe last. Patient tolerated fittign procedure.  ? ?Patient was provided with verbal and written instruction and demonstration regarding donning, doffing, wear, care, proper fit, function, purpose, cleaning, and use of the orthosis and in all related precautions and risks and benefits regarding the orthosis. ? ?Patient was also provided with verbal instruction regarding how to report any failures or malfunctions of the orthosis and necessary follow up care. Patient was also instructed to contact our office regarding any change in status that may affect the function of the orthosis. ? ?Patient demonstrated independence with proper donning, doffing, and fit and verbalized understanding of all instructions. ? ?PLAN: ?Patient is to follow up in one week or as necessary (PRN). All questions were answered and concerns addressed. Plan of care was discussed with and agreed upon by the patient. ? ?

## 2021-09-14 NOTE — Progress Notes (Signed)
Subjective:  ? ?Patient ID: Robert Hatfield, male   DOB: 51 y.o.   MRN: [id number]  ? ?HPI ?Patient presents stating I should have been here earlier but I have been dealing with cancer of my colon and I am working on that currently.  Patient states that the joint was doing well for a fairly long time and it started to just get sore again recently.  Patient does not smoke is very active ? ? ?Review of Systems  ?All other systems reviewed and are negative. ? ? ?   ?Objective:  ?Physical Exam ?Vitals and nursing note reviewed.  ?Constitutional:   ?   Appearance: He is well-developed.  ?Pulmonary:  ?   Effort: Pulmonary effort is normal.  ?Musculoskeletal:     ?   General: Normal range of motion.  ?Skin: ?   General: Skin is warm.  ?Neurological:  ?   Mental Status: He is alert.  ?  ?Neurovascular status found to be intact muscle strength was found to be adequate range of motion adequate.  Patient does have reduced range of motion of the first MPJ right with inflammation of the joint surface and moderate pain with no crepitus of the joint noted ? ?   ?Assessment:  ?Hallux limitus deformity right with functional structural condition and capsulitis of the joint surface ? ?   ?Plan:  ?H&P x-ray reviewed condition discussed today I did sterile periarticular injection around the joint 3 mg dexamethasone Kenalog 5 mg Xylocaine and I went ahead and recommended rigid bottom shoes and orthotics were dispensed that he will begin using now.  Patient will be seen back for [place] to recheck ? ?X-rays indicate there is spurring and narrowing of the joint but it does not appear to have progressed significantly from previous visit ?   ? ? ?

## 2021-10-02 ENCOUNTER — Encounter: Payer: Self-pay | Admitting: Internal Medicine

## 2021-10-03 ENCOUNTER — Other Ambulatory Visit: Payer: Self-pay | Admitting: Family Medicine

## 2021-10-03 DIAGNOSIS — F411 Generalized anxiety disorder: Secondary | ICD-10-CM

## 2021-10-03 NOTE — Telephone Encounter (Signed)
Is this okay to refill? 

## 2021-10-25 ENCOUNTER — Inpatient Hospital Stay: Payer: BC Managed Care – PPO | Attending: Oncology | Admitting: Oncology

## 2021-10-25 VITALS — BP 115/75 | HR 61 | Temp 98.4°F | Resp 18 | Wt 179.8 lb

## 2021-10-25 DIAGNOSIS — Z923 Personal history of irradiation: Secondary | ICD-10-CM | POA: Insufficient documentation

## 2021-10-25 DIAGNOSIS — Z9221 Personal history of antineoplastic chemotherapy: Secondary | ICD-10-CM | POA: Insufficient documentation

## 2021-10-25 DIAGNOSIS — Z85038 Personal history of other malignant neoplasm of large intestine: Secondary | ICD-10-CM | POA: Insufficient documentation

## 2021-10-25 DIAGNOSIS — Z87891 Personal history of nicotine dependence: Secondary | ICD-10-CM | POA: Diagnosis not present

## 2021-10-25 DIAGNOSIS — C20 Malignant neoplasm of rectum: Secondary | ICD-10-CM | POA: Diagnosis not present

## 2021-10-25 DIAGNOSIS — Z85048 Personal history of other malignant neoplasm of rectum, rectosigmoid junction, and anus: Secondary | ICD-10-CM | POA: Diagnosis not present

## 2021-10-25 NOTE — Progress Notes (Signed)
  [facility] OFFICE PROGRESS NOTE   Diagnosis: Colon cancer  INTERVAL HISTORY:   Mr. Perfect returns as scheduled.  He feels well.  He is working.  He has rectal urgency and intermittent episodes of frequent small-volume stool.  Rare incontinence.  Objective:  Vital signs in last 24 hours:  Blood pressure 115/75, pulse 61, temperature 98.4 F (36.9 C), temperature source Oral, resp. rate 18, weight 179 lb 12.8 oz (81.6 kg), SpO2 98 %.    Lymphatics: No cervical, supraclavicular, axillary, or inguinal nodes. Resp: Lungs clear bilaterally Cardio: Regular rate and rhythm GI: No hepatosplenomegaly, no mass, nontender Vascular: No leg edema  Skin: Mobile cutaneous round/oval lesions in the right axilla and right groin   Lab Results:  Lab Results  Component Value Date   WBC 5.5 07/07/2021   HGB 13.3 07/07/2021   HCT 40.0 07/07/2021   MCV 92.4 07/07/2021   PLT 258 07/07/2021   NEUTROABS 2.3 02/08/2021    CMP  Lab Results  Component Value Date   NA 138 07/07/2021   K 3.5 07/07/2021   CL 104 07/07/2021   CO2 28 07/07/2021   GLUCOSE 97 07/07/2021   BUN 12 07/07/2021   CREATININE 0.81 07/07/2021   CALCIUM 8.6 (L) 07/07/2021   PROT 6.4 (L) 02/08/2021   ALBUMIN 4.1 02/08/2021   AST 22 02/08/2021   ALT 27 02/08/2021   ALKPHOS 44 02/08/2021   BILITOT 0.6 02/08/2021   GFRNONAA >60 07/07/2021   GFRAA 119 11/17/2019    Lab Results  Component Value Date   CEA 1.92 08/14/2021     Medications: I have reviewed the patient's current medications.   Assessment/Plan: Colon cancer Biopsy of a descending colon mass at 40 cm revealed adenocarcinoma, normal mismatch repair protein expression Low anterior resection 05/03/2021-grade 3 adenocarcinoma in the descending colon measuring 1.5 x 0.8 cm, no lymphovascular perineural invasion, negative margins, absent treatment effect, tumor invades submucosa, 0/39 lymph nodes, ypT1,ypN0 Rectal mass Circumferential mass  beginning at 4 cm from the anal verge-biopsy tubulovillous adenoma with high-grade dysplasia CT 11/27/2020-indeterminate 7 mm segment 4 liver lesion, no other evidence of metastatic disease MRI pelvis 12/10/2020-tumor at 12.1 cm from the anal verge, 6.4 cm from the internal anal sphincter, T3b versus T3c, N0 Sigmoidoscopy-biopsy of rectal mass 12/22/2020-distal edge biopsy, tubulovillous adenoma; proximal edge biopsy focal invasive adenocarcinoma arising in a tubulovillous adenoma with high-grade dysplasia, lymphovascular invasion present  Xeloda/radiation 01/08/2021-02/15/2021 CTs 04/09/2021-diminished size of intraluminal mass in the superior rectum, unchanged subcentimeter low-attenuation lesion in hepatic segment 4B, no evidence of metastatic disease Low anterior resection, diverting loop ileostomy 05/03/2021, tubulovillous adenoma with high-grade dysplasia 1.5 cm from the distal resection margin Ileostomy takedown 07/05/2021 CT 08/17/2021-no change in hepatic segment 4B lesion, interval low rectal resection and reanastomosis, no evidence of metastatic disease   3.  COPD 4.  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  Family history of colon polyps 6.  Multiple colon polyps on the colonoscopy 11/16/2020-tubular adenomas 7.  Remote history of tobacco use 8.  Remote history of heavy alcohol use 9.  Vitamin B12 deficiency 10.  Ileostomy reversal 07/05/2021     Disposition: Robert Hatfield remains in clinical remission from colon cancer and rectal cancer.  He will return for an office visit and CEA in 3 months.  He is scheduled for a surveillance colonoscopy next month.   Betsy Coder, MD  10/25/2021  11:52 AM

## 2021-11-07 ENCOUNTER — Ambulatory Visit (AMBULATORY_SURGERY_CENTER): Payer: Self-pay | Admitting: *Deleted

## 2021-11-07 ENCOUNTER — Other Ambulatory Visit (HOSPITAL_COMMUNITY): Payer: Self-pay

## 2021-11-07 VITALS — Ht 73.0 in | Wt 177.0 lb

## 2021-11-07 DIAGNOSIS — Z800 Family history of malignant neoplasm of digestive organs: Secondary | ICD-10-CM

## 2021-11-07 DIAGNOSIS — C20 Malignant neoplasm of rectum: Secondary | ICD-10-CM

## 2021-11-07 DIAGNOSIS — Z86010 Personal history of colonic polyps: Secondary | ICD-10-CM

## 2021-11-07 MED ORDER — NA SULFATE-K SULFATE-MG SULF 17.5-3.13-1.6 GM/177ML PO SOLN
1.0000 | Freq: Once | ORAL | 0 refills | Status: AC
  Filled 2021-11-07: qty 354, 1d supply, fill #0

## 2021-11-07 NOTE — Progress Notes (Signed)
No egg or soy allergy known to patient  No issues known to pt with past sedation with any surgeries or procedures Patient denies ever being told they had issues or difficulty with intubation  No FH of Malignant Hyperthermia Pt is not on diet pills Pt is not on  home 02  Pt is not on blood thinners  Pt denies issues with constipation  No A fib or A flutter    NO PA's for preps discussed with pt In PV today  Discussed with pt there will be an out-of-pocket cost for prep and that varies from $0 to 70 +  dollars - pt verbalized understanding  Pt instructed to use Singlecare.com or GoodRx for a price reduction on prep

## 2021-11-17 HISTORY — PX: COLONOSCOPY: SHX174

## 2021-11-23 ENCOUNTER — Encounter: Payer: Self-pay | Admitting: Internal Medicine

## 2021-11-24 NOTE — Progress Notes (Signed)
Chief Complaint  Patient presents with   Annual Exam    Fasting (had black coffee and pound cake) annual exam, no new concerns. Did not have bivalent booster. Will take Tdap today.     Robert Hatfield is a 51 y.o. male who presents for a complete physical.  He arrived 10 minutes after his appointment time (25 mins late).  Patient was last seen in January to discuss his anxiety, as his xanax frequency was noted to increase. He reported taking a xanax usually in the morning, sometimes before bed. If he has a lot on schedule for the day, triggers his anxiety, and he takes one in morning. If his mind busy at night, will take to shut mind down to sleep. Also he may take one if he finds he is biting his nails a lot. Sometimes he will take 2/day.  He was started on Buspar, and is currently taking 15mg  BID.  He sometimes doesn't take the pm dose.  Since starting Buspar, xanax refills have spaced out more again.  He filled #45 on 1/12, 3/16 and 10/03/21. Buspar helps him feel much more even-keeled.  Uses xanax less often, lasting 2 months.  Takes it sometimes in the morning when he wakes up "knotted up". Took 1 today.   Colon Cancer was diagnosed after colonoscopy in 10/2020. He is under  the care of Dr. Truett Perna (oncology) and Dr. Cliffton Asters (surgery). He underwent chemo and radiation, and robotic assisted low anterior resection and coloanal anastomosis, and diverting loop ileostomy on 05/03/2021. He underwent ileostomy takedown in 06/2021.  He is scheduled for colonoscopy 11/28/21.  He last saw Dr. Truett Perna 10/25/21. He gets episodes of frequent bowel movement, fecal urgency, small amounts, but not diarrhea.  Has been on vacation this past week with some dietary changes, flaring in the last 5-6 days.  He gets this flares regularly, but usually only last 1-2 days, not as long.  Symptoms are mainly at night.  Wife is doing well with her melanoma--she completed immunotherapy and hasn't had any known recurrences.  She had  a PFO (and some small strokes related to it) repaired. Some trouble with memory, otherwise doing very well.  Son had shoulder surgery (soccer player), and doing well.   B12 deficiency: level was low at 214 in July. He started on an oral supplement (1mg  daily), and levels improved. He ran out of the B12 supplement a few months ago. Lab Results  Component Value Date   VITAMINB12 510 03/02/2021      Vitamin D deficiency: last level was 24.8 in 11/2020.  He was prescribed 12 weeks of 50,000 IU and told to start taking OTC D3 1000 IU upon completion.  He did take them for a while, but hasn't been taking any D3 for a few months (as long as December).  Hypercholesterolemia:  Lipids were higher than they had been in 10/2019 (total 227, LDL 153. Recheck 11/2020 was back to his usual, LDL 131.  He reported at that time that he was eating a little more fast food (busy with work, teenagers at home), red meat at least 1x/week (up to 3), 6-9 eggs/week, +cheese. He is due for recheck, but not fasting today.  He admits to more fried foods on recent vacation. Last lipids: Lab Results  Component Value Date   CHOL 205 (H) 11/22/2020   HDL 55 11/22/2020   LDLCALC 131 (H) 11/22/2020   TRIG 105 11/22/2020   CHOLHDL 3.7 11/22/2020     Immunization  History  Administered Date(s) Administered   Influenza,inj,Quad PF,6+ Mos 05/30/2014, 02/24/2018, 02/11/2019, 03/23/2021   PFIZER Comirnaty(Gray Top)Covid-19 Tri-Sucrose Vaccine 09/26/2020   PFIZER(Purple Top)SARS-COV-2 Vaccination 08/05/2019, 08/30/2019, 04/18/2020   Td 10/08/2000   Tdap 04/09/2012   Zoster Recombinat (Shingrix) 11/17/2019, 11/22/2020   Last colonoscopy: 11/16/20 with Dr. Marina Goodell; diagnosed with colon cancer.  Had flex sig 12/2020 and 05/2021. Scheduled this week for colonoscopy. Last PSA:  Lab Results  Component Value Date   PSA1 1.9 11/22/2020   PSA1 1.7 11/17/2019   PSA1 1.3 11/11/2018   PSA 1.62 05/30/2014   PSA 1.32 06/17/2011   Dentist:  twice yearly Ophtho: yearly (has contacts) Exercise: 2x/week sit-ups/push-ups/weights. Cardio 3x/week (golf). Golfs in the summer (walk the courses), 3x/week. Edible marijuana a few times/week  Negative HIV/HepC screens via blood donation in the past.   PMH, PSH, SH reviewed  Outpatient Encounter Medications as of 11/26/2021  Medication Sig Note   ALPRAZolam (XANAX) 0.5 MG tablet Take 1/2-1 tablets (0.25-0.5 mg total) by mouth 3 (three) times daily as needed for anxiety. 11/26/2021: Took one this am   busPIRone (BUSPAR) 15 MG tablet Start 1/2 tablet twice daily, and increase as directed, if needed, to full tablet twice daily (Patient taking differently: Start 1 tablet twice daily, and increase as directed, if needed, to full tablet twice daily) 11/26/2021: One tablet BID   diphenoxylate-atropine (LOMOTIL) 2.5-0.025 MG tablet Take 1 tablet by mouth 4 (four) times daily as needed for diarrhea or loose stools. 11/26/2021: Took this am   acetaminophen (TYLENOL) 500 MG tablet Take 1,000 mg by mouth every 8 (eight) hours as needed for moderate pain. (Patient not taking: Reported on 11/26/2021) 11/26/2021: prn   cyanocobalamin 1000 MCG tablet Take 1,000 mcg by mouth daily. (Patient not taking: Reported on 11/07/2021) 11/26/2021: Stopped 2-3 months ago   ibuprofen (ADVIL) 200 MG tablet Take 400 mg by mouth every 8 (eight) hours as needed for moderate pain. (Patient not taking: Reported on 11/26/2021) 11/26/2021: prn   sildenafil (VIAGRA) 100 MG tablet Take 100 mg by mouth daily as needed. (Patient not taking: Reported on 11/26/2021) 11/26/2021: prn   tamsulosin (FLOMAX) 0.4 MG CAPS capsule Take 1 capsule (0.4 mg total) by mouth daily after supper. (Patient not taking: Reported on 10/25/2021) 11/26/2021: Used post-surgically, no longer needs   Facility-Administered Encounter Medications as of 11/26/2021  Medication   0.9 %  sodium chloride infusion   No Known Allergies  ROS:  The patient denies anorexia, fever,  headaches, vision loss, decreased hearing, ear pain, hoarseness, chest pain, palpitations, dizziness, syncope, dyspnea on exertion, cough, swelling, nausea, vomiting, melena, hematochezia, indigestion/heartburn, hematuria, incontinence, nocturia, weakened urine stream, dysuria, genital lesions, tremor, suspicious skin lesions, depression, abnormal bleeding/bruising, or enlarged lymph nodes. Anxiety--improved with buspar per HPI. Foot pain--has orthotics, gets injections periodically (arthritis). Fecal urgency/frequency per HPI. ED controlled with Viagra prn. Some R pinkie pain--unsure if related to a lot more golf playing recently   PHYSICAL EXAM:  BP 100/70   Pulse 60   Ht 6' (1.829 m)   Wt 179 lb (81.2 kg)   BMI 24.28 kg/m   Wt Readings from Last 3 Encounters:  11/26/21 179 lb (81.2 kg)  11/07/21 177 lb (80.3 kg)  10/25/21 179 lb 12.8 oz (81.6 kg)   General Appearance:    Alert, cooperative, no distress, appears stated age. Pt declined to get changed into gown from exam today.  Head:     Normocephalic, without obvious abnormality, atraumatic   Eyes:  PERRL, conjunctiva/corneas clear, EOM's intact, fundi benign   Ears:     Normal TM's and external ear canals   Nose:    Normal, no drainage or sinus tenderness  Throat:    Normal mucosa, no lesions  Neck:    Supple, no lymphadenopathy;  thyroid:  no enlargement/ tenderness/nodules; no carotid bruit or JVD.   Back:     Spine nontender, no curvature, ROM normal, no CVA tenderness   Lungs:      Clear to auscultation bilaterally without wheezes, rales or ronchi; respirations unlabored   Chest Wall:     No tenderness or deformity    Heart:     Regular rate and rhythm, S1 and S2 normal, no murmur, rub or gallop   Breast Exam:     No chest wall tenderness, masses or gynecomastia   Abdomen:      Soft, non-tender, nondistended, normoactive bowel sounds, no masses, no hepatosplenomegaly. WHSS  Genitalia:    Not performed, declined  Rectal:     Not performed, declined  Extremities:    No clubbing, cyanosis or edema. Slight swelling of R pinkie.  Slight lack of complete extension.  Otherwise normal motion, sensation. No erythema/warmth.   Pulses:    2+ and symmetric all extremities   Skin:    Skin color, texture, turgor normal, no rashes or lesions (exam limited, back examined).    Lymph nodes:    Cervical, supraclavicular nodes normal   Neurologic:    Normal strength and gait; DTRs symmetric.             Psych:   Normal mood, affect, hygiene and grooming    ASSESSMENT/PLAN:  Annual physical exam - Plan: PSA, Vitamin B12, Lipid panel, Comprehensive metabolic panel, CBC with Differential/Platelet, VITAMIN D 25 Hydroxy (Vit-D Deficiency, Fractures)  Vitamin D deficiency - noncompliant with supplements Due for recheck - Plan: VITAMIN D 25 Hydroxy (Vit-D Deficiency, Fractures)  B12 deficiency - noncompliant with supplement, due for recheck - Plan: Vitamin B12, CBC with Differential/Platelet  Adenocarcinoma of colon (HCC) - doing well.  Recheck colonoscopy scheduled for later this week - Plan: Comprehensive metabolic panel, CBC with Differential/Platelet  Pure hypercholesterolemia - Plan: Lipid panel  Screening for prostate cancer - Plan: PSA  Need for Tdap vaccination - Plan: Tdap vaccine greater than or equal to 7yo IM  Generalized anxiety disorder - Improved on Buspar, continue.  Rec xanax use sparingly - Plan: ALPRAZolam (XANAX) 0.5 MG tablet, busPIRone (BUSPAR) 15 MG tablet  Consider pneumonia vaccine due to cancer history--can discuss rec with his oncologist.   Discussed PSA screening (risks/benefits). Recommended at least 30 minutes of aerobic activity at least 5 days/week, weight-bearing exercise at least 2x/week; proper sunscreen use reviewed; healthy diet and alcohol recommendations (less than or equal to 2 drinks/day) reviewed; regular seatbelt use; changing batteries in smoke detectors, use of carbon monoxide  detectors. Immunization recommendations discussed--yearly flu shots recommended. Updated COVID booster recommended when available in September.  TdaP given today. Follow-up colonoscopy is scheduled for tomorrow.   CPE 1 yr, sooner prn.

## 2021-11-24 NOTE — Patient Instructions (Incomplete)
  HEALTH MAINTENANCE RECOMMENDATIONS:  It is recommended that you get at least 30 minutes of aerobic exercise at least 5 days/week (for weight loss, you may need as much as 60-90 minutes). This can be any activity that gets your heart rate up. This can be divided in 10-15 minute intervals if needed, but try and build up your endurance at least once a week.  Weight bearing exercise is also recommended twice weekly.  Eat a healthy diet with lots of vegetables, fruits and fiber.  "Colorful" foods have a lot of vitamins (ie green vegetables, tomatoes, red peppers, etc).  Limit sweet tea, regular sodas and alcoholic beverages, all of which has a lot of calories and sugar.  Up to 2 alcoholic drinks daily may be beneficial for men (unless trying to lose weight, watch sugars).  Drink a lot of water.  Sunscreen of at least SPF 30 should be used on all sun-exposed parts of the skin when outside between the hours of 10 am and 4 pm (not just when at beach or pool, but even with exercise, golf, tennis, and yard work!)  Use a sunscreen that says "broad spectrum" so it covers both UVA and UVB rays, and make sure to reapply every 1-2 hours.  Remember to change the batteries in your smoke detectors when changing your clock times in the spring and fall.  Carbon monoxide detectors are recommended for your home.  Use your seat belt every time you are in a car, and please drive safely and not be distracted with cell phones and texting while driving.   I recommend getting the updated bivalent COVID booster when it becomes available in the Fall.   Continue yearly flu shots.  We discussed high fiber diet. Trial of Voltaren gel as needed, and icing pinkie after golf.

## 2021-11-26 ENCOUNTER — Ambulatory Visit (INDEPENDENT_AMBULATORY_CARE_PROVIDER_SITE_OTHER): Payer: BC Managed Care – PPO | Admitting: Family Medicine

## 2021-11-26 ENCOUNTER — Encounter: Payer: Self-pay | Admitting: Family Medicine

## 2021-11-26 VITALS — BP 100/70 | HR 60 | Ht 72.0 in | Wt 179.0 lb

## 2021-11-26 DIAGNOSIS — E559 Vitamin D deficiency, unspecified: Secondary | ICD-10-CM | POA: Diagnosis not present

## 2021-11-26 DIAGNOSIS — Z0000 Encounter for general adult medical examination without abnormal findings: Secondary | ICD-10-CM

## 2021-11-26 DIAGNOSIS — Z125 Encounter for screening for malignant neoplasm of prostate: Secondary | ICD-10-CM | POA: Diagnosis not present

## 2021-11-26 DIAGNOSIS — Z23 Encounter for immunization: Secondary | ICD-10-CM | POA: Diagnosis not present

## 2021-11-26 DIAGNOSIS — E7800 Pure hypercholesterolemia, unspecified: Secondary | ICD-10-CM | POA: Diagnosis not present

## 2021-11-26 DIAGNOSIS — C189 Malignant neoplasm of colon, unspecified: Secondary | ICD-10-CM | POA: Diagnosis not present

## 2021-11-26 DIAGNOSIS — E538 Deficiency of other specified B group vitamins: Secondary | ICD-10-CM

## 2021-11-26 DIAGNOSIS — F411 Generalized anxiety disorder: Secondary | ICD-10-CM

## 2021-11-26 MED ORDER — BUSPIRONE HCL 15 MG PO TABS: 15.0000 mg | ORAL_TABLET | Freq: Two times a day (BID) | ORAL | 3 refills | Status: DC

## 2021-11-26 MED ORDER — ALPRAZOLAM 0.5 MG PO TABS: ORAL_TABLET | ORAL | 0 refills | Status: DC

## 2021-11-28 ENCOUNTER — Ambulatory Visit (AMBULATORY_SURGERY_CENTER): Payer: BC Managed Care – PPO | Admitting: Internal Medicine

## 2021-11-28 ENCOUNTER — Encounter: Payer: Self-pay | Admitting: Internal Medicine

## 2021-11-28 VITALS — BP 118/76 | HR 63 | Temp 98.4°F | Resp 16 | Ht 73.0 in | Wt 177.0 lb

## 2021-11-28 DIAGNOSIS — Z85048 Personal history of other malignant neoplasm of rectum, rectosigmoid junction, and anus: Secondary | ICD-10-CM

## 2021-11-28 DIAGNOSIS — Z08 Encounter for follow-up examination after completed treatment for malignant neoplasm: Secondary | ICD-10-CM | POA: Diagnosis not present

## 2021-11-28 DIAGNOSIS — Z86010 Personal history of colonic polyps: Secondary | ICD-10-CM

## 2021-11-28 DIAGNOSIS — Z09 Encounter for follow-up examination after completed treatment for conditions other than malignant neoplasm: Secondary | ICD-10-CM | POA: Diagnosis not present

## 2021-11-28 DIAGNOSIS — D120 Benign neoplasm of cecum: Secondary | ICD-10-CM

## 2021-11-28 DIAGNOSIS — K635 Polyp of colon: Secondary | ICD-10-CM

## 2021-11-28 DIAGNOSIS — C20 Malignant neoplasm of rectum: Secondary | ICD-10-CM

## 2021-11-28 MED ORDER — SODIUM CHLORIDE 0.9 % IV SOLN: 500.0000 mL | Freq: Once | INTRAVENOUS | Status: DC

## 2021-11-28 NOTE — Op Note (Signed)
[facility] Patient Name: Robert Hatfield Procedure Date: 11/28/2021 9:01 AM MRN: [id number] Endoscopist: Docia Chuck. Henrene Pastor , MD Age: 51 Referring MD:  Date of Birth: [birth year] Gender: Male Account #: [id number] Procedure:                Colonoscopy with cold snare polypectomy x 1 Indications:              High risk colon cancer surveillance: Personal                            history of colon cancer. Diagnosed with large                            rectal tumor and ascending colon cancer                            synchronously June 2022. Subsequent neoadjuvant                            therapy followed by low anterior resection with                            temporary ileostomy and subsequent takedown. Now                            for surveillance Medicines:                Monitored Anesthesia Care Procedure:                Pre-Anesthesia Assessment:                           - Prior to the procedure, a History and Physical                            was performed, and patient medications and                            allergies were reviewed. The patient's tolerance of                            previous anesthesia was also reviewed. The risks                            and benefits of the procedure and the sedation                            options and risks were discussed with the patient.                            All questions were answered, and informed consent                            was obtained. Prior Anticoagulants: The patient has  taken no previous anticoagulant or antiplatelet                            agents. ASA Grade Assessment: II - A patient with                            mild systemic disease. After reviewing the risks                            and benefits, the patient was deemed in                            satisfactory condition to undergo the procedure.                           After obtaining informed consent, the  colonoscope                            was passed under direct vision. Throughout the                            procedure, the patient's blood pressure, pulse, and                            oxygen saturations were monitored continuously. The                            Olympus CF-HQ190L ([id number]) Colonoscope was                            introduced through the anus and advanced to the the                            cecum, identified by appendiceal orifice and                            ileocecal valve. The ileocecal valve, appendiceal                            orifice, and rectum were photographed. The quality                            of the bowel preparation was excellent. The                            colonoscopy was performed without difficulty. The                            patient tolerated the procedure well. The bowel                            preparation used was SUPREP via split dose  instruction. Scope In: 9:23:26 AM Scope Out: 9:32:56 AM Scope Withdrawal Time: 0 hours 7 minutes 50 seconds  Total Procedure Duration: 0 hours 9 minutes 30 seconds  Findings:                 A 3 mm polyp was found in the cecum. The polyp was                            sessile. The polyp was removed with a cold snare.                            Resection and retrieval were complete.                           A few diverticula were found in the left colon.                           The anastomosis was just above the anal verge.                            Surgical staples visualized. Widely patent. Exam                            was otherwise without abnormality. No retroflexion                            due to the older anatomy.. Complications:            No immediate complications. Estimated blood loss:                            None. Estimated Blood Loss:     Estimated blood loss: none. Impression:               - One 3 mm polyp in the cecum, removed with a  cold                            snare. Resected and retrieved.                           - Diverticulosis in the left colon.                           - Status post low anterior resection                           - The examination was otherwise normal . Recommendation:           - Repeat colonoscopy in 1 year for surveillance.                           - Patient has a contact number available for                            emergencies. The signs and symptoms of potential  delayed complications were discussed with the                            patient. Return to normal activities tomorrow.                            Written discharge instructions were provided to the                            patient.                           - Resume previous diet.                           - Continue present medications.                           - Await pathology results. Docia Chuck. Henrene Pastor, MD 11/28/2021 9:40:44 AM This report has been signed electronically.

## 2021-11-28 NOTE — Progress Notes (Signed)
Called to room to assist during endoscopic procedure.  Patient ID and intended procedure confirmed with present staff. Received instructions for my participation in the procedure from the performing physician.  

## 2021-11-28 NOTE — Progress Notes (Signed)
Upon D?C pt c/o right shoulder pain 6/7. Wondered if it was "air" . Pt was passing gas prior to D?C. IV was Dc'd intact and had no redness or swelling at the site. Told patient to let us know if it doesn't improve and could just be related to the positioning during the procedure. Will notify Dr. Henrene Pastor.

## 2021-11-28 NOTE — Progress Notes (Signed)
PT taken to PACU. Monitors in place. VSS. Report given to RN. 

## 2021-11-28 NOTE — Progress Notes (Signed)
Pt's states no medical or surgical changes since previsit or office visit. 

## 2021-11-28 NOTE — Progress Notes (Signed)
HISTORY OF PRESENT ILLNESS:  Robert Hatfield is a 51 y.o. male diagnosed with metachronous colon cancer June 2022.  Subsequent neoadjuvant therapy followed by low anterior resection with temporary ileostomy and subsequent takedown.  Now for surveillance.  REVIEW OF SYSTEMS:  All non-GI ROS negative. 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 Nelva Bush)   Cancer [facility])    colorectal   Depression 2000-2002, 2015   treated with Zoloft and Klonopin x 1-2 yrs; recurrent in 2015   Family history of breast cancer 12/18/2020   Family history of colon cancer 12/18/2020   Family history of colonic polyps 12/18/2020   Family history of lung cancer 12/18/2020   Family history of prostate cancer 12/18/2020   Neuromuscular disorder ([place])    bil feet   Peripheral neuropathy    Personal history of colonic polyps 12/18/2020   Pneumonia    as a child   Pure hyperglyceridemia    Umbilical hernia     Past Surgical History:  Procedure Laterality Date   COLONOSCOPY     DEBRIDEMENT TENNIS ELBOW Right 2000   DIVERTING ILEOSTOMY N/A 05/03/2021   Procedure: DIVERTING LOOP ILEOSTOMY;  Surgeon: Ileana Roup, MD;  Location: [facility];  Service: General;  Laterality: N/A;   FLEXIBLE SIGMOIDOSCOPY N/A 05/03/2021   Procedure: Beryle Quant;  Surgeon: Ileana Roup, MD;  Location: [facility];  Service: General;  Laterality: N/A;   FLEXIBLE SIGMOIDOSCOPY N/A 06/04/2021   Procedure: FLEXIBLE SIGMOIDOSCOPY;  Surgeon: Ileana Roup, MD;  Location: [facility];  Service: General;  Laterality: N/A;   HUMERUS FRACTURE SURGERY Right    age 52   ILEOSTOMY CLOSURE N/A 07/05/2021   Procedure: loop ILEOSTOMY TAKEDOWN;  Surgeon: Ileana Roup, MD;  Location: [facility];  Service: General;  Laterality: N/A;   KNEE ARTHROSCOPY  Left    age 24-20   KNEE ARTHROSCOPY Right    age 17-20   POLYPECTOMY     SIGMOIDOSCOPY  12/22/2020   TONSILLECTOMY  child   XI ROBOTIC ASSISTED LOWER ANTERIOR RESECTION N/A 05/03/2021   Procedure: XI ROBOTIC ASSISTED LOWER ANTERIOR RESECTION, COLOANAL ANASTAMOSIS, INTRAOPERATIVE ASSESSMENT OF PERFUSION USING FIREFLY, BILATERAL TAP BLOCK;  Surgeon: Ileana Roup, MD;  Location: [facility];  Service: General;  Laterality: N/A;    Social History Burnard Leigh  reports that he quit smoking about 18 years ago. His smoking use included cigarettes. He has never used smokeless tobacco. He reports that he does not currently use alcohol. He reports that he does not currently use drugs. Frequency: 2.00 times per week.  family history includes AAA (abdominal aortic aneurysm) in his father; Alcoholism in his mother; Breast cancer in his paternal grandmother; Colon cancer in his father and paternal grandmother; Colon polyps in his brother and father; Colon polyps (age of onset: 37) in his sister; Depression in his brother and mother; Hypertension in his father; Kidney disease in his father; Lung cancer in his father, mother, and paternal uncle; Prostate cancer in his father.  No Known Allergies     PHYSICAL EXAMINATION: Vital signs: BP 125/83   Pulse 63   Temp 98.4 F (36.9 C)   Resp 12   Ht '6\' 1"'$  (1.854 m)   Wt 177 lb (  80.3 kg)   SpO2 100%   BMI 23.35 kg/m  General: Well-developed, well-nourished, no acute distress HEENT: Sclerae are anicteric, conjunctiva pink. Oral mucosa intact Lungs: Clear Heart: Regular Abdomen: soft, nontender, nondistended, no obvious ascites, no peritoneal signs, normal bowel sounds. No organomegaly. Extremities: No edema Psychiatric: alert and oriented x3. Cooperative      ASSESSMENT:  Personal history of colon cancer   PLAN:  Surveillance colonoscopy

## 2021-11-28 NOTE — Patient Instructions (Signed)
Thank you for letting us take care of your heatlhcare needs today. Please see handouts given to you on Polyps and Diverticulosis.    YOU HAD AN ENDOSCOPIC PROCEDURE TODAY AT [facility]:   Refer to the procedure report that was given to you for any specific questions about what was found during the examination.  If the procedure report does not answer your questions, please call your gastroenterologist to clarify.  If you requested that your care partner not be given the details of your procedure findings, then the procedure report has been included in a sealed envelope for you to review at your convenience later.  YOU SHOULD EXPECT: Some feelings of bloating in the abdomen. Passage of more gas than usual.  Walking can help get rid of the air that was put into your GI tract during the procedure and reduce the bloating. If you had a lower endoscopy (such as a colonoscopy or flexible sigmoidoscopy) you may notice spotting of blood in your stool or on the toilet paper. If you underwent a bowel prep for your procedure, you may not have a normal bowel movement for a few days.  Please Note:  You might notice some irritation and congestion in your nose or some drainage.  This is from the oxygen used during your procedure.  There is no need for concern and it should clear up in a day or so.  SYMPTOMS TO REPORT IMMEDIATELY:  Following lower endoscopy (colonoscopy or flexible sigmoidoscopy):  Excessive amounts of blood in the stool  Significant tenderness or worsening of abdominal pains  Swelling of the abdomen that is new, acute  Fever of 100F or higher   For urgent or emergent issues, a gastroenterologist can be reached at any hour by calling [phone]. Do not use MyChart messaging for urgent concerns.    DIET:  We do recommend a small meal at first, but then you may proceed to your regular diet.  Drink plenty of fluids but you should avoid alcoholic beverages for 24  hours.  ACTIVITY:  You should plan to take it easy for the rest of today and you should NOT DRIVE or use heavy machinery until tomorrow (because of the sedation medicines used during the test).    FOLLOW UP: Our staff will call the number listed on your records the next business day following your procedure.  We will call around 7:15- 8:00 am to check on you and address any questions or concerns that you may have regarding the information given to you following your procedure. If we do not reach you, we will leave a message.  If you develop any symptoms (ie: fever, flu-like symptoms, shortness of breath, cough etc.) before then, please call [phone].  If you test positive for Covid 19 in the 2 weeks post procedure, please call and report this information to [place].    If any biopsies were taken you will be contacted by phone or by letter within the next 1-3 weeks.  Please call us at [phone] if you have not heard about the biopsies in 3 weeks.    SIGNATURES/CONFIDENTIALITY: You and/or your care partner have signed paperwork which will be entered into your electronic medical record.  These signatures attest to the fact that that the information above on your After Visit Summary has been reviewed and is understood.  Full responsibility of the confidentiality of this discharge information lies with you and/or your care-partner.

## 2021-11-29 ENCOUNTER — Telehealth: Payer: Self-pay

## 2021-11-29 NOTE — Telephone Encounter (Signed)
  Follow up Call-     11/28/2021    8:10 AM 12/22/2020    2:10 PM 11/16/2020    8:50 AM  Call back number  Post procedure Call Back phone  # [phone]  Permission to leave phone message Yes Yes Yes    Follow up call, LVM.

## 2021-12-03 ENCOUNTER — Other Ambulatory Visit: Payer: BC Managed Care – PPO

## 2021-12-03 DIAGNOSIS — E7800 Pure hypercholesterolemia, unspecified: Secondary | ICD-10-CM

## 2021-12-03 DIAGNOSIS — E538 Deficiency of other specified B group vitamins: Secondary | ICD-10-CM

## 2021-12-03 DIAGNOSIS — C189 Malignant neoplasm of colon, unspecified: Secondary | ICD-10-CM

## 2021-12-03 DIAGNOSIS — Z125 Encounter for screening for malignant neoplasm of prostate: Secondary | ICD-10-CM | POA: Diagnosis not present

## 2021-12-03 DIAGNOSIS — E559 Vitamin D deficiency, unspecified: Secondary | ICD-10-CM | POA: Diagnosis not present

## 2021-12-03 DIAGNOSIS — Z0000 Encounter for general adult medical examination without abnormal findings: Secondary | ICD-10-CM

## 2021-12-04 ENCOUNTER — Encounter: Payer: Self-pay | Admitting: Internal Medicine

## 2021-12-04 LAB — COMPREHENSIVE METABOLIC PANEL
ALT: 13 IU/L (ref 0–44)
AST: 15 IU/L (ref 0–40)
Albumin/Globulin Ratio: 2 (ref 1.2–2.2)
Albumin: 4.3 g/dL (ref 3.8–4.9)
Alkaline Phosphatase: 55 IU/L (ref 44–121)
BUN/Creatinine Ratio: 15 (ref 9–20)
BUN: 12 mg/dL (ref 6–24)
Bilirubin Total: 0.8 mg/dL (ref 0.0–1.2)
CO2: 26 mmol/L (ref 20–29)
Calcium: 9.4 mg/dL (ref 8.7–10.2)
Chloride: 101 mmol/L (ref 96–106)
Creatinine, Ser: 0.79 mg/dL (ref 0.76–1.27)
Globulin, Total: 2.2 g/dL (ref 1.5–4.5)
Glucose: 103 mg/dL — ABNORMAL HIGH (ref 70–99)
Potassium: 4.2 mmol/L (ref 3.5–5.2)
Sodium: 139 mmol/L (ref 134–144)
Total Protein: 6.5 g/dL (ref 6.0–8.5)
eGFR: 107 mL/min/{1.73_m2} (ref >59)

## 2021-12-04 LAB — CBC WITH DIFFERENTIAL/PLATELET
Basophils Absolute: 0 10*3/uL (ref 0.0–0.2)
Basos: 1 %
EOS (ABSOLUTE): 0.1 10*3/uL (ref 0.0–0.4)
Eos: 2 %
Hematocrit: 41.9 % (ref 37.5–51.0)
Hemoglobin: 14 g/dL (ref 13.0–17.7)
Immature Grans (Abs): 0 10*3/uL (ref 0.0–0.1)
Immature Granulocytes: 1 %
Lymphocytes Absolute: 0.9 10*3/uL (ref 0.7–3.1)
Lymphs: 20 %
MCH: 29.7 pg (ref 26.6–33.0)
MCHC: 33.4 g/dL (ref 31.5–35.7)
MCV: 89 fL (ref 79–97)
Monocytes Absolute: 0.4 10*3/uL (ref 0.1–0.9)
Monocytes: 9 %
Neutrophils Absolute: 3 10*3/uL (ref 1.4–7.0)
Neutrophils: 67 %
Platelets: 261 10*3/uL (ref 150–450)
RBC: 4.71 x10E6/uL (ref 4.14–5.80)
RDW: 13.4 % (ref 11.6–15.4)
WBC: 4.3 10*3/uL (ref 3.4–10.8)

## 2021-12-04 LAB — LIPID PANEL
Chol/HDL Ratio: 3.1 ratio (ref 0.0–5.0)
Cholesterol, Total: 166 mg/dL (ref 100–199)
HDL: 54 mg/dL (ref >39)
LDL Chol Calc (NIH): 94 mg/dL (ref 0–99)
Triglycerides: 101 mg/dL (ref 0–149)
VLDL Cholesterol Cal: 18 mg/dL (ref 5–40)

## 2021-12-04 LAB — VITAMIN B12: Vitamin B-12: 268 pg/mL (ref 232–1245)

## 2021-12-04 LAB — PSA: Prostate Specific Ag, Serum: 1.3 ng/mL (ref 0.0–4.0)

## 2021-12-04 LAB — VITAMIN D 25 HYDROXY (VIT D DEFICIENCY, FRACTURES): Vit D, 25-Hydroxy: 30.8 ng/mL (ref 30.0–100.0)

## 2022-01-23 ENCOUNTER — Other Ambulatory Visit: Payer: Self-pay | Admitting: Family Medicine

## 2022-01-23 ENCOUNTER — Encounter: Payer: Self-pay | Admitting: Internal Medicine

## 2022-01-23 DIAGNOSIS — F411 Generalized anxiety disorder: Secondary | ICD-10-CM

## 2022-01-24 NOTE — Telephone Encounter (Signed)
Is this okay to refill? 

## 2022-02-05 ENCOUNTER — Inpatient Hospital Stay: Payer: BC Managed Care – PPO | Attending: Oncology

## 2022-02-05 ENCOUNTER — Inpatient Hospital Stay (HOSPITAL_BASED_OUTPATIENT_CLINIC_OR_DEPARTMENT_OTHER): Payer: BC Managed Care – PPO | Admitting: Oncology

## 2022-02-05 VITALS — BP 111/67 | HR 60 | Temp 98.1°F | Resp 18 | Ht 73.0 in | Wt 179.4 lb

## 2022-02-05 DIAGNOSIS — E538 Deficiency of other specified B group vitamins: Secondary | ICD-10-CM | POA: Diagnosis not present

## 2022-02-05 DIAGNOSIS — Z85048 Personal history of other malignant neoplasm of rectum, rectosigmoid junction, and anus: Secondary | ICD-10-CM | POA: Diagnosis not present

## 2022-02-05 DIAGNOSIS — J449 Chronic obstructive pulmonary disease, unspecified: Secondary | ICD-10-CM | POA: Insufficient documentation

## 2022-02-05 DIAGNOSIS — Z87891 Personal history of nicotine dependence: Secondary | ICD-10-CM | POA: Insufficient documentation

## 2022-02-05 DIAGNOSIS — C20 Malignant neoplasm of rectum: Secondary | ICD-10-CM

## 2022-02-05 DIAGNOSIS — C189 Malignant neoplasm of colon, unspecified: Secondary | ICD-10-CM

## 2022-02-05 LAB — CEA (ACCESS): CEA (CHCC): 1.7 ng/mL (ref 0.00–5.00)

## 2022-02-05 NOTE — Progress Notes (Signed)
[place] OFFICE PROGRESS NOTE   Diagnosis: Colon cancer  INTERVAL HISTORY:   Robert Hatfield returns as scheduled.  He has irregular bowel habits.  He reports intermittent rectal urgency with small volume stool.  The stool is formed.  Lomotil helps decrease the frequency of bowel movements.  He otherwise feels well.  Objective:  Vital signs in last 24 hours:  Blood pressure 111/67, pulse 60, temperature 98.1 F (36.7 C), resp. rate 18, height $RemoveBe'6\' 1"'eohRuCOBH$  (1.854 m), weight 179 lb 6.4 oz (81.4 kg), SpO2 100 %.    Lymphatics: No cervical, supraclavicular, axillary, or inguinal nodes Resp: Lungs clear bilaterally Cardio: Regular rate and rhythm GI: No hepatosplenomegaly, no mass, nontender Vascular: No leg edema   Lab Results:  Lab Results  Component Value Date   WBC 4.3 12/03/2021   HGB 14.0 12/03/2021   HCT 41.9 12/03/2021   MCV 89 12/03/2021   PLT 261 12/03/2021   NEUTROABS 3.0 12/03/2021    CMP  Lab Results  Component Value Date   NA 139 12/03/2021   K 4.2 12/03/2021   CL 101 12/03/2021   CO2 26 12/03/2021   GLUCOSE 103 (H) 12/03/2021   BUN 12 12/03/2021   CREATININE 0.79 12/03/2021   CALCIUM 9.4 12/03/2021   PROT 6.5 12/03/2021   ALBUMIN 4.3 12/03/2021   AST 15 12/03/2021   ALT 13 12/03/2021   ALKPHOS 55 12/03/2021   BILITOT 0.8 12/03/2021   GFRNONAA >60 07/07/2021   GFRAA 119 11/17/2019    Lab Results  Component Value Date   CEA 1.92 08/14/2021    Medications: I have reviewed the patient's current medications.   Assessment/Plan: Colon cancer Biopsy of a descending colon mass at 40 cm revealed adenocarcinoma, normal mismatch repair protein expression Low anterior resection 05/03/2021-grade 3 adenocarcinoma in the descending colon measuring 1.5 x 0.8 cm, no lymphovascular perineural invasion, negative margins, absent treatment effect, tumor invades submucosa, 0/39 lymph nodes, ypT1,ypN0 Colonoscopy 11/28/2021-polyp removed from the cecum,  sessile serrated polyp Rectal mass Circumferential mass beginning at 4 cm from the anal verge-biopsy tubulovillous adenoma with high-grade dysplasia CT 11/27/2020-indeterminate 7 mm segment 4 liver lesion, no other evidence of metastatic disease MRI pelvis 12/10/2020-tumor at 12.1 cm from the anal verge, 6.4 cm from the internal anal sphincter, T3b versus T3c, N0 Sigmoidoscopy-biopsy of rectal mass 12/22/2020-distal edge biopsy, tubulovillous adenoma; proximal edge biopsy focal invasive adenocarcinoma arising in a tubulovillous adenoma with high-grade dysplasia, lymphovascular invasion present  Xeloda/radiation 01/08/2021-02/15/2021 CTs 04/09/2021-diminished size of intraluminal mass in the superior rectum, unchanged subcentimeter low-attenuation lesion in hepatic segment 4B, no evidence of metastatic disease Low anterior resection, diverting loop ileostomy 05/03/2021, tubulovillous adenoma with high-grade dysplasia 1.5 cm from the distal resection margin Ileostomy takedown 07/05/2021 CT 08/17/2021-no change in hepatic segment 4B lesion, interval low rectal resection and reanastomosis, no evidence of metastatic disease   3.  COPD 4.  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  Family history of colon polyps 6.  Multiple colon polyps on the colonoscopy 11/16/2020-tubular adenomas 7.  Remote history of tobacco use 8.  Remote history of heavy alcohol use 9.  Vitamin B12 deficiency 10.  Ileostomy reversal 07/05/2021      Disposition: Robert Hatfield remains in clinical remission from colon cancer and rectal cancer.  We will follow-up on the CEA from today.  He will try fiber and follow-up with Dr. Dema Severin if the irregular bowel habits persist.  He would like to continue surveillance  imaging.  He will be scheduled for an office visit and surveillance imaging in March 2024.  Betsy Coder, MD  02/05/2022  12:44 PM

## 2022-02-06 ENCOUNTER — Telehealth: Payer: Self-pay | Admitting: *Deleted

## 2022-02-06 NOTE — Telephone Encounter (Signed)
Left VM that CEA is normal and f/u as scheduled.

## 2022-02-06 NOTE — Telephone Encounter (Signed)
-----   Message from Ladell Pier, MD sent at 02/05/2022  5:25 PM EDT ----- Please call patient, the CEA is normal, follow-up as scheduled

## 2022-02-19 DIAGNOSIS — L820 Inflamed seborrheic keratosis: Secondary | ICD-10-CM | POA: Diagnosis not present

## 2022-02-26 ENCOUNTER — Encounter: Payer: Self-pay | Admitting: Internal Medicine

## 2022-03-04 DIAGNOSIS — M5412 Radiculopathy, cervical region: Secondary | ICD-10-CM | POA: Diagnosis not present

## 2022-03-08 ENCOUNTER — Other Ambulatory Visit: Payer: Self-pay | Admitting: Internal Medicine

## 2022-03-11 ENCOUNTER — Other Ambulatory Visit: Payer: Self-pay | Admitting: Family Medicine

## 2022-03-11 DIAGNOSIS — F411 Generalized anxiety disorder: Secondary | ICD-10-CM

## 2022-03-11 NOTE — Telephone Encounter (Signed)
Is this okay to refill? 

## 2022-03-26 ENCOUNTER — Encounter: Payer: Self-pay | Admitting: Family Medicine

## 2022-04-14 ENCOUNTER — Other Ambulatory Visit: Payer: Self-pay | Admitting: Family Medicine

## 2022-04-14 DIAGNOSIS — F411 Generalized anxiety disorder: Secondary | ICD-10-CM

## 2022-04-15 NOTE — Telephone Encounter (Signed)
Called patient and he did request this refill. He said he has been using a little more frequently, due some issues.

## 2022-06-14 ENCOUNTER — Other Ambulatory Visit: Payer: Self-pay | Admitting: Family Medicine

## 2022-06-14 DIAGNOSIS — F411 Generalized anxiety disorder: Secondary | ICD-10-CM

## 2022-07-30 ENCOUNTER — Other Ambulatory Visit: Payer: Self-pay | Admitting: Family Medicine

## 2022-07-30 DIAGNOSIS — F411 Generalized anxiety disorder: Secondary | ICD-10-CM

## 2022-07-30 NOTE — Telephone Encounter (Signed)
Is this okay to refill? 

## 2022-08-06 ENCOUNTER — Inpatient Hospital Stay: Payer: BC Managed Care – PPO | Attending: Oncology

## 2022-08-06 ENCOUNTER — Ambulatory Visit (HOSPITAL_BASED_OUTPATIENT_CLINIC_OR_DEPARTMENT_OTHER)
Admission: RE | Admit: 2022-08-06 | Discharge: 2022-08-06 | Disposition: A | Discharge status: Home / Self Care | Payer: BC Managed Care – PPO | Source: Ambulatory Visit | Attending: Oncology | Admitting: Oncology

## 2022-08-06 DIAGNOSIS — E538 Deficiency of other specified B group vitamins: Secondary | ICD-10-CM | POA: Insufficient documentation

## 2022-08-06 DIAGNOSIS — J449 Chronic obstructive pulmonary disease, unspecified: Secondary | ICD-10-CM | POA: Insufficient documentation

## 2022-08-06 DIAGNOSIS — Z87891 Personal history of nicotine dependence: Secondary | ICD-10-CM | POA: Insufficient documentation

## 2022-08-06 DIAGNOSIS — C189 Malignant neoplasm of colon, unspecified: Secondary | ICD-10-CM | POA: Diagnosis not present

## 2022-08-06 DIAGNOSIS — Z923 Personal history of irradiation: Secondary | ICD-10-CM | POA: Insufficient documentation

## 2022-08-06 DIAGNOSIS — N3289 Other specified disorders of bladder: Secondary | ICD-10-CM | POA: Diagnosis not present

## 2022-08-06 DIAGNOSIS — Z9221 Personal history of antineoplastic chemotherapy: Secondary | ICD-10-CM | POA: Insufficient documentation

## 2022-08-06 DIAGNOSIS — Z85048 Personal history of other malignant neoplasm of rectum, rectosigmoid junction, and anus: Secondary | ICD-10-CM | POA: Insufficient documentation

## 2022-08-06 LAB — BASIC METABOLIC PANEL - CANCER CENTER ONLY
Anion gap: 6 (ref 5–15)
BUN: 15 mg/dL (ref 6–20)
CO2: 28 mmol/L (ref 22–32)
Calcium: 9.6 mg/dL (ref 8.9–10.3)
Chloride: 103 mmol/L (ref 98–111)
Creatinine: 0.74 mg/dL (ref 0.61–1.24)
GFR, Estimated: >60 mL/min (ref >60)
Glucose, Bld: 115 mg/dL — ABNORMAL HIGH (ref 70–99)
Potassium: 4 mmol/L (ref 3.5–5.1)
Sodium: 137 mmol/L (ref 135–145)

## 2022-08-06 LAB — CEA (ACCESS): CEA (CHCC): 1.94 ng/mL (ref 0.00–5.00)

## 2022-08-06 MED ORDER — IOHEXOL 300 MG/ML  SOLN
100.0000 mL | Freq: Once | INTRAMUSCULAR | Status: AC | PRN
  Administered 2022-08-06: 85 mL via INTRAVENOUS

## 2022-08-08 ENCOUNTER — Inpatient Hospital Stay: Payer: BC Managed Care – PPO | Admitting: Oncology

## 2022-08-08 VITALS — BP 113/74 | HR 66 | Temp 98.2°F | Resp 18 | Ht 73.0 in | Wt 181.0 lb

## 2022-08-08 DIAGNOSIS — Z87891 Personal history of nicotine dependence: Secondary | ICD-10-CM | POA: Diagnosis not present

## 2022-08-08 DIAGNOSIS — Z85048 Personal history of other malignant neoplasm of rectum, rectosigmoid junction, and anus: Secondary | ICD-10-CM | POA: Diagnosis not present

## 2022-08-08 DIAGNOSIS — J449 Chronic obstructive pulmonary disease, unspecified: Secondary | ICD-10-CM | POA: Diagnosis not present

## 2022-08-08 DIAGNOSIS — Z9221 Personal history of antineoplastic chemotherapy: Secondary | ICD-10-CM | POA: Diagnosis not present

## 2022-08-08 DIAGNOSIS — E538 Deficiency of other specified B group vitamins: Secondary | ICD-10-CM | POA: Diagnosis not present

## 2022-08-08 DIAGNOSIS — C20 Malignant neoplasm of rectum: Secondary | ICD-10-CM

## 2022-08-08 DIAGNOSIS — Z923 Personal history of irradiation: Secondary | ICD-10-CM | POA: Diagnosis not present

## 2022-08-08 NOTE — Progress Notes (Signed)
[facility] OFFICE PROGRESS NOTE   Diagnosis: Colon cancer, rectal cancer  INTERVAL HISTORY:   Robert Hatfield returns as scheduled.  He feels well.  Good appetite and energy level.  He continues to have irregular bowel habits, but this has improved.  No bleeding.  He has a "disc "issue in the neck and receives injection therapy.  Objective:  Vital signs in last 24 hours:  Blood pressure 113/74, pulse 66, temperature 98.2 F (36.8 C), resp. rate 18, height 6\' 1"  (1.854 m), weight 181 lb (82.1 kg), SpO2 100 %.    HEENT: Neck without mass Lymphatics: No cervical, supraclavicular, axillary, or inguinal nodes Resp: Lungs clear bilaterally, distant breath sounds Cardio: Regular rate and rhythm, distant heart sounds GI: No hepatosplenomegaly, no mass, nontender Vascular: No leg edema  Lab Results:  Lab Results  Component Value Date   WBC 4.3 12/03/2021   HGB 14.0 12/03/2021   HCT 41.9 12/03/2021   MCV 89 12/03/2021   PLT 261 12/03/2021   NEUTROABS 3.0 12/03/2021    CMP  Lab Results  Component Value Date   NA 137 08/06/2022   K 4.0 08/06/2022   CL 103 08/06/2022   CO2 28 08/06/2022   GLUCOSE 115 (H) 08/06/2022   BUN 15 08/06/2022   CREATININE 0.74 08/06/2022   CALCIUM 9.6 08/06/2022   PROT 6.5 12/03/2021   ALBUMIN 4.3 12/03/2021   AST 15 12/03/2021   ALT 13 12/03/2021   ALKPHOS 55 12/03/2021   BILITOT 0.8 12/03/2021   GFRNONAA >60 08/06/2022   GFRAA 119 11/17/2019    Lab Results  Component Value Date   CEA 1.94 08/06/2022    Lab Results  Component Value Date   INR 1.0 11/22/2020   LABPROT 11.1 11/22/2020    Imaging:  CT CHEST ABDOMEN PELVIS W CONTRAST  Result Date: 08/07/2022 CLINICAL DATA:  Follow-up colon carcinoma. Previous neoadjuvant chemotherapy and low anterior resection. Surveillance. * Tracking Code: BO * EXAM: CT CHEST, ABDOMEN, AND PELVIS WITH CONTRAST TECHNIQUE: Multidetector CT imaging of the chest, abdomen and pelvis was  performed following the standard protocol during bolus administration of intravenous contrast. RADIATION DOSE REDUCTION: This exam was performed according to the departmental dose-optimization program which includes automated exposure control, adjustment of the mA and/or kV according to patient size and/or use of iterative reconstruction technique. CONTRAST:  67mL OMNIPAQUE IOHEXOL 300 MG/ML  SOLN COMPARISON:  None Available. FINDINGS: CT CHEST FINDINGS Cardiovascular: No acute findings. Mediastinum/Lymph Nodes: No masses or pathologically enlarged lymph nodes identified. Lungs/Pleura: No suspicious pulmonary nodules or masses identified. No evidence of infiltrate or pleural effusion. Musculoskeletal:  No suspicious bone lesions identified. CT ABDOMEN AND PELVIS FINDINGS Hepatobiliary: No masses identified. Gallbladder is unremarkable. No evidence of biliary ductal dilatation. Pancreas:  No mass or inflammatory changes. Spleen:  Within normal limits in size and appearance. Adrenals/Urinary tract: No suspicious masses or hydronephrosis. Mild diffuse bladder wall thickening shows no significant change, and may be due to chronic cystitis or chronic bladder outlet obstruction. Stomach/Bowel: Low rectal surgical anastomosis again seen. Presacral soft tissue density is stable and consistent with posttreatment changes. No mass identified. No evidence of obstruction, inflammatory process, or abnormal fluid collections. Normal appendix visualized. Vascular/Lymphatic: No pathologically enlarged lymph nodes identified. No acute vascular findings. Aortic atherosclerotic calcification incidentally noted. Reproductive:  No mass or other significant abnormality identified. Other:  None. Musculoskeletal:  No suspicious bone lesions identified. IMPRESSION: Stable exam. No evidence of recurrent or metastatic carcinoma within the chest, abdomen,  or pelvis. Aortic Atherosclerosis (ICD10-I70.0). Electronically Signed   By: Marlaine Hind  M.D.   On: 08/07/2022 13:21    Medications: I have reviewed the patient's current medications.   Assessment/Plan: Colon cancer 11/16/2020-Biopsy of a descending colon mass at 40 cm revealed adenocarcinoma, normal mismatch repair protein expression Low anterior resection 05/03/2021-grade 3 adenocarcinoma in the descending colon measuring 1.5 x 0.8 cm, no lymphovascular perineural invasion, negative margins, absent treatment effect, tumor invades submucosa, 0/39 lymph nodes, ypT1,ypN0 Colonoscopy 11/28/2021-polyp removed from the cecum, sessile serrated polyp Rectal mass Circumferential mass beginning at 4 cm from the anal verge-biopsy tubulovillous adenoma with high-grade dysplasia CT 11/27/2020-indeterminate 7 mm segment 4 liver lesion, no other evidence of metastatic disease MRI pelvis 12/10/2020-tumor at 12.1 cm from the anal verge, 6.4 cm from the internal anal sphincter, T3b versus T3c, N0 Sigmoidoscopy-biopsy of rectal mass 12/22/2020-distal edge biopsy, tubulovillous adenoma; proximal edge biopsy focal invasive adenocarcinoma arising in a tubulovillous adenoma with high-grade dysplasia, lymphovascular invasion present  Xeloda/radiation 01/08/2021-02/15/2021 CTs 04/09/2021-diminished size of intraluminal mass in the superior rectum, unchanged subcentimeter low-attenuation lesion in hepatic segment 4B, no evidence of metastatic disease Low anterior resection, diverting loop ileostomy 05/03/2021, tubulovillous adenoma with high-grade dysplasia 1.5 cm from the distal resection margin Ileostomy takedown 07/05/2021 CT 08/17/2021-no change in hepatic segment 4B lesion, interval low rectal resection and reanastomosis, no evidence of metastatic disease CTs 08/06/2022-no evidence of metastatic disease   3.  COPD 4.  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  Family history of colon polyps 6.  Multiple colon polyps on the colonoscopy 11/16/2020-tubular adenomas 7.  Remote  history of tobacco use 8.  Remote history of heavy alcohol use 9.  Vitamin B12 deficiency 10.  Ileostomy reversal 07/05/2021       Disposition: Robert Hatfield remains in clinical remission from colon and rectal cancer.  He will return for an office visit and CEA in 6 months.  Betsy Coder, MD  08/08/2022  8:25 AM

## 2022-08-27 DIAGNOSIS — H53143 Visual discomfort, bilateral: Secondary | ICD-10-CM | POA: Diagnosis not present

## 2022-09-17 ENCOUNTER — Other Ambulatory Visit: Payer: Self-pay | Admitting: Family Medicine

## 2022-09-17 DIAGNOSIS — F411 Generalized anxiety disorder: Secondary | ICD-10-CM

## 2022-09-17 NOTE — Telephone Encounter (Signed)
Called patient and he did request refill.

## 2022-10-30 ENCOUNTER — Other Ambulatory Visit: Payer: Self-pay | Admitting: Family Medicine

## 2022-10-30 DIAGNOSIS — F411 Generalized anxiety disorder: Secondary | ICD-10-CM

## 2022-10-30 NOTE — Telephone Encounter (Signed)
Called patient and he did request this refill. 

## 2022-10-31 ENCOUNTER — Encounter: Payer: Self-pay | Admitting: Internal Medicine

## 2022-11-11 ENCOUNTER — Other Ambulatory Visit: Payer: Self-pay | Admitting: Internal Medicine

## 2022-11-28 ENCOUNTER — Other Ambulatory Visit: Payer: Self-pay | Admitting: Family Medicine

## 2022-11-28 DIAGNOSIS — F411 Generalized anxiety disorder: Secondary | ICD-10-CM

## 2022-11-28 NOTE — Telephone Encounter (Signed)
Pt has an appt later this month

## 2022-12-10 DIAGNOSIS — I700 Atherosclerosis of aorta: Secondary | ICD-10-CM | POA: Insufficient documentation

## 2022-12-10 DIAGNOSIS — E7800 Pure hypercholesterolemia, unspecified: Secondary | ICD-10-CM | POA: Insufficient documentation

## 2022-12-10 DIAGNOSIS — Z85048 Personal history of other malignant neoplasm of rectum, rectosigmoid junction, and anus: Secondary | ICD-10-CM | POA: Insufficient documentation
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
## 2022-12-10 NOTE — Patient Instructions (Incomplete)
  HEALTH MAINTENANCE RECOMMENDATIONS:  It is recommended that you get at least 30 minutes of aerobic exercise at least 5 days/week (for weight loss, you may need as much as 60-90 minutes). This can be any activity that gets your heart rate up. This can be divided in 10-15 minute intervals if needed, but try and build up your endurance at least once a week.  Weight bearing exercise is also recommended twice weekly.  Eat a healthy diet with lots of vegetables, fruits and fiber.  "Colorful" foods have a lot of vitamins (ie green vegetables, tomatoes, red peppers, etc).  Limit sweet tea, regular sodas and alcoholic beverages, all of which has a lot of calories and sugar.  Up to 2 alcoholic drinks daily may be beneficial for men (unless trying to lose weight, watch sugars).  Drink a lot of water.  Sunscreen of at least SPF 30 should be used on all sun-exposed parts of the skin when outside between the hours of 10 am and 4 pm (not just when at beach or pool, but even with exercise, golf, tennis, and yard work!)  Use a sunscreen that says "broad spectrum" so it covers both UVA and UVB rays, and make sure to reapply every 1-2 hours.  Remember to change the batteries in your smoke detectors when changing your clock times in the spring and fall.  Carbon monoxide detectors are recommended for your home.  Use your seat belt every time you are in a car, and please drive safely and not be distracted with cell phones and texting while driving.   Flu shots are recommended every Fall. Updated COVID booster is recommended when it becomes available in the Fall.  Consider taking Tylenol first thing, and use the Aleve if/when you have pain (and take it with food).  Talk about your bowel habits with your GI.  I recommend a high fiber diet, consider a fiber supplement (ie benefiber, metamucil), and probiotics. Consider keeping a journal--to see how much the Lomotil might be contributing to constipation.  Please try  and drink more water in the morning, before work.  This may help with the dizziness.  Let us know if things worsen.

## 2022-12-11 ENCOUNTER — Ambulatory Visit: Payer: BC Managed Care – PPO | Admitting: Family Medicine

## 2022-12-11 ENCOUNTER — Encounter: Payer: Self-pay | Admitting: Family Medicine

## 2022-12-11 VITALS — BP 110/68 | HR 60 | Ht 72.0 in | Wt 176.2 lb

## 2022-12-11 DIAGNOSIS — E559 Vitamin D deficiency, unspecified: Secondary | ICD-10-CM

## 2022-12-11 DIAGNOSIS — Z0000 Encounter for general adult medical examination without abnormal findings: Secondary | ICD-10-CM

## 2022-12-11 DIAGNOSIS — C189 Malignant neoplasm of colon, unspecified: Secondary | ICD-10-CM

## 2022-12-11 DIAGNOSIS — F411 Generalized anxiety disorder: Secondary | ICD-10-CM | POA: Diagnosis not present

## 2022-12-11 DIAGNOSIS — Z5181 Encounter for therapeutic drug level monitoring: Secondary | ICD-10-CM

## 2022-12-11 DIAGNOSIS — I700 Atherosclerosis of aorta: Secondary | ICD-10-CM

## 2022-12-11 DIAGNOSIS — E7800 Pure hypercholesterolemia, unspecified: Secondary | ICD-10-CM | POA: Diagnosis not present

## 2022-12-11 DIAGNOSIS — Z125 Encounter for screening for malignant neoplasm of prostate: Secondary | ICD-10-CM | POA: Diagnosis not present

## 2022-12-11 DIAGNOSIS — B351 Tinea unguium: Secondary | ICD-10-CM

## 2022-12-11 DIAGNOSIS — Z85048 Personal history of other malignant neoplasm of rectum, rectosigmoid junction, and anus: Secondary | ICD-10-CM | POA: Diagnosis not present

## 2022-12-11 DIAGNOSIS — E538 Deficiency of other specified B group vitamins: Secondary | ICD-10-CM | POA: Diagnosis not present

## 2022-12-11 DIAGNOSIS — N529 Male erectile dysfunction, unspecified: Secondary | ICD-10-CM

## 2022-12-11 LAB — POCT URINALYSIS DIP (PROADVANTAGE DEVICE)
Bilirubin, UA: NEGATIVE
Blood, UA: NEGATIVE
Glucose, UA: NEGATIVE mg/dL
Ketones, POC UA: NEGATIVE mg/dL
Leukocytes, UA: NEGATIVE
Nitrite, UA: NEGATIVE
Protein Ur, POC: NEGATIVE mg/dL
Specific Gravity, Urine: 1.025
Urobilinogen, Ur: 0.2
pH, UA: 6 (ref 5.0–8.0)

## 2022-12-11 LAB — CBC WITH DIFFERENTIAL/PLATELET
Basos: 1 %
Hemoglobin: 15.2 g/dL (ref 13.0–17.7)
Immature Grans (Abs): 0 10*3/uL (ref 0.0–0.1)
Immature Granulocytes: 0 %
Lymphocytes Absolute: 1.3 10*3/uL (ref 0.7–3.1)
MCH: 30 pg (ref 26.6–33.0)
Monocytes Absolute: 0.3 10*3/uL (ref 0.1–0.9)
Neutrophils Absolute: 2.5 10*3/uL (ref 1.4–7.0)
Neutrophils: 59 %
Platelets: 259 10*3/uL (ref 150–450)
RBC: 5.06 x10E6/uL (ref 4.14–5.80)
RDW: 13.3 % (ref 11.6–15.4)
WBC: 4.1 10*3/uL (ref 3.4–10.8)

## 2022-12-11 LAB — PSA

## 2022-12-11 LAB — CMP14+EGFR
Albumin: 4.6 g/dL (ref 3.8–4.9)
CO2: 25 mmol/L (ref 20–29)
Creatinine, Ser: 0.89 mg/dL (ref 0.76–1.27)
Globulin, Total: 2.4 g/dL (ref 1.5–4.5)
Potassium: 4.4 mmol/L (ref 3.5–5.2)
Total Protein: 7 g/dL (ref 6.0–8.5)
eGFR: 102 mL/min/{1.73_m2} (ref >59)

## 2022-12-11 LAB — VITAMIN B12

## 2022-12-11 LAB — LIPID PANEL
LDL Chol Calc (NIH): 107 mg/dL — ABNORMAL HIGH (ref 0–99)
Triglycerides: 148 mg/dL (ref 0–149)
VLDL Cholesterol Cal: 26 mg/dL (ref 5–40)

## 2022-12-11 LAB — VITAMIN D 25 HYDROXY (VIT D DEFICIENCY, FRACTURES)

## 2022-12-11 MED ORDER — SILDENAFIL CITRATE 100 MG PO TABS: 100.0000 mg | ORAL_TABLET | Freq: Every day | ORAL | 11 refills | Status: DC | PRN

## 2022-12-11 MED ORDER — BUSPIRONE HCL 15 MG PO TABS: 15.0000 mg | ORAL_TABLET | Freq: Two times a day (BID) | ORAL | 3 refills | Status: DC

## 2022-12-11 MED ORDER — ALPRAZOLAM 0.5 MG PO TABS: ORAL_TABLET | ORAL | 0 refills | Status: DC

## 2022-12-12 LAB — CBC WITH DIFFERENTIAL/PLATELET
Basophils Absolute: 0 10*3/uL (ref 0.0–0.2)
EOS (ABSOLUTE): 0.1 10*3/uL (ref 0.0–0.4)
Eos: 1 %
Hematocrit: 46 % (ref 37.5–51.0)
Lymphs: 31 %
MCHC: 33 g/dL (ref 31.5–35.7)
MCV: 91 fL (ref 79–97)
Monocytes: 8 %

## 2022-12-12 LAB — CMP14+EGFR
ALT: 19 IU/L (ref 0–44)
AST: 22 IU/L (ref 0–40)
Alkaline Phosphatase: 52 IU/L (ref 44–121)
BUN/Creatinine Ratio: 13 (ref 9–20)
BUN: 12 mg/dL (ref 6–24)
Bilirubin Total: 0.6 mg/dL (ref 0.0–1.2)
Calcium: 9.7 mg/dL (ref 8.7–10.2)
Chloride: 102 mmol/L (ref 96–106)
Glucose: 95 mg/dL (ref 70–99)
Sodium: 140 mmol/L (ref 134–144)

## 2022-12-12 LAB — LIPID PANEL
Chol/HDL Ratio: 3.1 ratio (ref 0.0–5.0)
Cholesterol, Total: 197 mg/dL (ref 100–199)
HDL: 64 mg/dL (ref >39)

## 2022-12-13 ENCOUNTER — Encounter: Payer: Self-pay | Admitting: Family Medicine

## 2022-12-13 DIAGNOSIS — B351 Tinea unguium: Secondary | ICD-10-CM | POA: Insufficient documentation

## 2022-12-13 DIAGNOSIS — N529 Male erectile dysfunction, unspecified: Secondary | ICD-10-CM | POA: Insufficient documentation

## 2022-12-13 MED ORDER — ROSUVASTATIN CALCIUM 5 MG PO TABS: 5.0000 mg | ORAL_TABLET | Freq: Every day | ORAL | 3 refills | Status: DC

## 2022-12-13 MED ORDER — TERBINAFINE HCL 250 MG PO TABS: 250.0000 mg | ORAL_TABLET | Freq: Every day | ORAL | 2 refills | Status: DC

## 2022-12-13 NOTE — Assessment & Plan Note (Signed)
Discussed/educated re: atherosclerosis.  He is willing to take statin.  Diet has improved, await lipid results, likely can use low dose if remains good.  Risks/SE reviewed, and to contact us for alternate dosing if still having issues (after trial of CoQ10, if needed)

## 2022-12-13 NOTE — Assessment & Plan Note (Signed)
Start lamisil--checking baseline LFTs, then will start daily for 3 months. To schedule 6 week lab visit for LFTs. Discussed that it can take a year for great toenail to grow out.

## 2022-12-13 NOTE — Assessment & Plan Note (Signed)
Continue daily supplement 

## 2022-12-13 NOTE — Assessment & Plan Note (Signed)
Lipids improved on last check. He continues to follow a healthier diet. Due for recheck

## 2022-12-13 NOTE — Assessment & Plan Note (Signed)
Sildenafil isn't working as well.  He hasn't seen urologist this year.  We discussed other meds, but he prefers to stay on the sildenafil at this point, even though it isn't working as well as he would like. (Discussed cialis, daily vs prn, and/or returning to urologist to discuss other options)

## 2022-12-13 NOTE — Assessment & Plan Note (Signed)
Doing well on buspar 15 mg BID, and alprazolam. Needing alprazolam in the morning on weekdays, only occasionally in the evening.  Reviewed relaxation techniques, encouraged resuming meditation.

## 2022-12-13 NOTE — Assessment & Plan Note (Signed)
He is due for repeat colonoscopy, reminded him to schedule

## 2023-01-23 ENCOUNTER — Other Ambulatory Visit: Payer: BC Managed Care – PPO

## 2023-01-23 DIAGNOSIS — Z5181 Encounter for therapeutic drug level monitoring: Secondary | ICD-10-CM

## 2023-01-24 LAB — HEPATIC FUNCTION PANEL
ALT: 20 IU/L (ref 0–44)
AST: 18 IU/L (ref 0–40)
Albumin: 4.3 g/dL (ref 3.8–4.9)
Alkaline Phosphatase: 48 IU/L (ref 44–121)
Bilirubin Total: 0.5 mg/dL (ref 0.0–1.2)
Bilirubin, Direct: 0.13 mg/dL (ref 0.00–0.40)
Total Protein: 6.5 g/dL (ref 6.0–8.5)

## 2023-01-30 ENCOUNTER — Encounter: Payer: Self-pay | Admitting: Family Medicine

## 2023-01-30 ENCOUNTER — Other Ambulatory Visit: Payer: Self-pay | Admitting: Family Medicine

## 2023-01-30 DIAGNOSIS — F411 Generalized anxiety disorder: Secondary | ICD-10-CM

## 2023-01-30 NOTE — Telephone Encounter (Signed)
Patient did request this refill. 

## 2023-01-30 NOTE — Telephone Encounter (Signed)
Last filled 7/24

## 2023-02-07 ENCOUNTER — Other Ambulatory Visit: Payer: BC Managed Care – PPO

## 2023-02-07 ENCOUNTER — Ambulatory Visit: Payer: BC Managed Care – PPO | Admitting: Oncology

## 2023-02-13 ENCOUNTER — Inpatient Hospital Stay: Payer: BC Managed Care – PPO | Attending: Family Medicine

## 2023-02-13 ENCOUNTER — Encounter: Payer: Self-pay | Admitting: Internal Medicine

## 2023-02-13 ENCOUNTER — Encounter: Payer: Self-pay | Admitting: *Deleted

## 2023-02-13 ENCOUNTER — Inpatient Hospital Stay: Payer: BC Managed Care – PPO | Admitting: Oncology

## 2023-02-13 NOTE — Progress Notes (Signed)
Mr. Jessee was a "no show" for his lab/OV today. Scheduling message sent for reschedule for ~ 1 month

## 2023-03-12 ENCOUNTER — Encounter: Payer: Self-pay | Admitting: Family Medicine

## 2023-03-12 DIAGNOSIS — N529 Male erectile dysfunction, unspecified: Secondary | ICD-10-CM

## 2023-03-16 ENCOUNTER — Other Ambulatory Visit: Payer: Self-pay | Admitting: Family Medicine

## 2023-03-16 DIAGNOSIS — F411 Generalized anxiety disorder: Secondary | ICD-10-CM

## 2023-03-18 ENCOUNTER — Encounter: Payer: Self-pay | Admitting: Internal Medicine

## 2023-03-18 ENCOUNTER — Ambulatory Visit (AMBULATORY_SURGERY_CENTER): Payer: BC Managed Care – PPO

## 2023-03-18 VITALS — Ht 72.0 in | Wt 180.0 lb

## 2023-03-18 DIAGNOSIS — Z85038 Personal history of other malignant neoplasm of large intestine: Secondary | ICD-10-CM

## 2023-03-18 MED ORDER — NA SULFATE-K SULFATE-MG SULF 17.5-3.13-1.6 GM/177ML PO SOLN: 1.0000 | Freq: Once | ORAL | 0 refills | Status: AC

## 2023-03-18 NOTE — Progress Notes (Signed)
Pre visit completed via phone call; Patient verified name, DOB, and address; No egg or soy allergy known to patient;  No issues known to pt with past sedation with any surgeries or procedures; Patient denies ever being told they had issues or difficulty with intubation;  No FH of Malignant Hyperthermia; Pt is not on diet pills; Pt is not on home 02;  Pt is not on blood thinners;  Pt denies issues with constipation  No A fib or A flutter; Have any cardiac testing pending--NO Insurance verified during PV appt--- BCBS PPO Pt can ambulate without assistance;  Pt denies use of chewing tobacco; Discussed diabetic/weight loss medication holds; Discussed NSAID holds; Checked BMI to be less than 50; Pt instructed to use [employer] or GoodRx for a price reduction on prep;  Patient's chart reviewed by Cathlyn Parsons CNRA prior to previsit and patient appropriate for the [facility];  Pre visit completed and red dot placed by patient's name on their procedure day (on provider's schedule);  Instructions sent to MyChart per patient request;

## 2023-04-08 ENCOUNTER — Encounter: Payer: BC Managed Care – PPO | Admitting: Internal Medicine

## 2023-04-21 ENCOUNTER — Encounter: Payer: Self-pay | Admitting: Internal Medicine

## 2023-05-09 ENCOUNTER — Ambulatory Visit: Payer: BC Managed Care – PPO | Admitting: Gastroenterology

## 2023-05-22 ENCOUNTER — Other Ambulatory Visit: Payer: Self-pay | Admitting: Family Medicine

## 2023-05-22 DIAGNOSIS — F411 Generalized anxiety disorder: Secondary | ICD-10-CM

## 2023-05-22 NOTE — Telephone Encounter (Signed)
 Is this okay to refill?

## 2023-05-23 ENCOUNTER — Encounter: Payer: Self-pay | Admitting: Family Medicine

## 2023-05-24 ENCOUNTER — Other Ambulatory Visit: Payer: Self-pay | Admitting: Internal Medicine

## 2023-05-28 ENCOUNTER — Telehealth: Payer: Self-pay

## 2023-05-28 DIAGNOSIS — Z85038 Personal history of other malignant neoplasm of large intestine: Secondary | ICD-10-CM

## 2023-05-28 DIAGNOSIS — Z1211 Encounter for screening for malignant neoplasm of colon: Secondary | ICD-10-CM

## 2023-05-28 NOTE — Telephone Encounter (Signed)
 Patient returning call. Advised nurse will f/u with him in a few. Patient advised understanding.

## 2023-05-28 NOTE — Telephone Encounter (Signed)
 Left message for pt to call back.  Pt scheduled for colon in the [facility] 06/03/23 at 11am. Pt already has prep, updated instructions sent to pt. Amb ref in epic and pt aware.

## 2023-05-28 NOTE — Telephone Encounter (Signed)
 Thanks Bonita Quin!

## 2023-05-28 NOTE — Telephone Encounter (Signed)
-----   Message from Norleen Kiang sent at 05/28/2023 12:53 PM EST ----- Regarding: Colonoscopy [place],  This patient has a history of colon cancer.  He is overdue for surveillance colonoscopy and canceled a number of times.  He is currently scheduled for colonoscopy in March.  He knows Dr. Albertus, and told him recently that he was having symptoms and was not wanting to wait until March.  Please contact the patient and set him up for sooner colonoscopy.  I see openings at 11 AM at 11:30 AM on 16 January.  Hopefully that is acceptable to him.  If not, there are some other scattered spots before March.  Let me know what gets arranged.  Thanks  JP

## 2023-06-03 ENCOUNTER — Ambulatory Visit (AMBULATORY_SURGERY_CENTER): Payer: BC Managed Care – PPO | Admitting: Internal Medicine

## 2023-06-03 ENCOUNTER — Encounter: Payer: Self-pay | Admitting: Internal Medicine

## 2023-06-03 VITALS — BP 122/74 | HR 77 | Temp 98.3°F | Resp 14 | Ht 72.0 in | Wt 180.0 lb

## 2023-06-03 DIAGNOSIS — Z1211 Encounter for screening for malignant neoplasm of colon: Secondary | ICD-10-CM | POA: Diagnosis not present

## 2023-06-03 DIAGNOSIS — K5730 Diverticulosis of large intestine without perforation or abscess without bleeding: Secondary | ICD-10-CM | POA: Diagnosis not present

## 2023-06-03 DIAGNOSIS — Z85038 Personal history of other malignant neoplasm of large intestine: Secondary | ICD-10-CM

## 2023-06-03 MED ORDER — SODIUM CHLORIDE 0.9 % IV SOLN: 500.0000 mL | Freq: Once | INTRAVENOUS | Status: DC

## 2023-06-03 NOTE — Patient Instructions (Signed)
-  repeat colonoscopy in 2 year for screening -handout on diverticulosis provided   YOU HAD AN ENDOSCOPIC PROCEDURE TODAY AT THE [facility]:   Refer to the procedure report that was given to you for any specific questions about what was found during the examination.  If the procedure report does not answer your questions, please call your gastroenterologist to clarify.  If you requested that your care partner not be given the details of your procedure findings, then the procedure report has been included in a sealed envelope for you to review at your convenience later.  YOU SHOULD EXPECT: Some feelings of bloating in the abdomen. Passage of more gas than usual.  Walking can help get rid of the air that was put into your GI tract during the procedure and reduce the bloating. If you had a lower endoscopy (such as a colonoscopy or flexible sigmoidoscopy) you may notice spotting of blood in your stool or on the toilet paper. If you underwent a bowel prep for your procedure, you may not have a normal bowel movement for a few days.  Please Note:  You might notice some irritation and congestion in your nose or some drainage.  This is from the oxygen used during your procedure.  There is no need for concern and it should clear up in a day or so.  SYMPTOMS TO REPORT IMMEDIATELY:  Following lower endoscopy (colonoscopy or flexible sigmoidoscopy):  Excessive amounts of blood in the stool  Significant tenderness or worsening of abdominal pains  Swelling of the abdomen that is new, acute  Fever of 100F or higher  For urgent or emergent issues, a gastroenterologist can be reached at any hour by calling [phone]. Do not use MyChart messaging for urgent concerns.    DIET:  We do recommend a small meal at first, but then you may proceed to your regular diet.  Drink plenty of fluids but you should avoid alcoholic beverages for 24 hours.  ACTIVITY:  You should plan to take it easy for the rest  of today and you should NOT DRIVE or use heavy machinery until tomorrow (because of the sedation medicines used during the test).    FOLLOW UP: Our staff will call the number listed on your records the next business day following your procedure.  We will call around 7:15- 8:00 am to check on you and address any questions or concerns that you may have regarding the information given to you following your procedure. If we do not reach you, we will leave a message.     If any biopsies were taken you will be contacted by phone or by letter within the next 1-3 weeks.  Please call us  at [phone] if you have not heard about the biopsies in 3 weeks.    SIGNATURES/CONFIDENTIALITY: You and/or your care partner have signed paperwork which will be entered into your electronic medical record.  These signatures attest to the fact that that the information above on your After Visit Summary has been reviewed and is understood.  Full responsibility of the confidentiality of this discharge information lies with you and/or your care-partner.

## 2023-06-03 NOTE — Progress Notes (Signed)
 HISTORY OF PRESENT ILLNESS:  Robert Hatfield is a 53 y.o. 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  Subsequent resection with temporary ileostomy and subsequent takedown.  Most recent colonoscopy (surveillance) July 2023.  Now for follow-up surveillance.  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 Bonner)   Cancer [facility])    colorectal   Depression 2000-2002, 2015   treated with Zoloft  and Klonopin x 1-2 yrs; recurrent in 2015   Family history of breast cancer 12/18/2020   Family history of colon cancer 12/18/2020   Family history of colonic polyps 12/18/2020   Family history of lung cancer 12/18/2020   Family history of prostate cancer 12/18/2020   Neuromuscular disorder (HCC)    bil feet   Peripheral neuropathy    Personal history of colonic polyps 12/18/2020   Pneumonia    as a child   Pure hyperglyceridemia    Umbilical hernia     Past Surgical History:  Procedure Laterality Date   COLONOSCOPY  11/2021   JP-MAC-Suprep (exc)-1 polyp   DEBRIDEMENT TENNIS ELBOW Right 2000   DIVERTING ILEOSTOMY N/A 05/03/2021   Procedure: DIVERTING LOOP ILEOSTOMY;  Surgeon: Teresa Lonni HERO, MD;  Location: [facility];  Service: General;  Laterality: N/A;   FLEXIBLE SIGMOIDOSCOPY N/A 05/03/2021   Procedure: ENID MORIN;  Surgeon: Teresa Lonni HERO, MD;  Location: [facility];  Service: General;  Laterality: N/A;   FLEXIBLE SIGMOIDOSCOPY N/A 06/04/2021   Procedure: FLEXIBLE SIGMOIDOSCOPY;  Surgeon: Teresa Lonni HERO, MD;  Location: [facility];  Service: General;  Laterality: N/A;   HUMERUS FRACTURE SURGERY Right    age 38   ILEOSTOMY CLOSURE N/A 07/05/2021   Procedure: loop ILEOSTOMY TAKEDOWN;  Surgeon: Teresa Lonni HERO, MD;  Location: [facility];  Service: General;  Laterality: N/A;   KNEE ARTHROSCOPY Left    age 81-20   KNEE ARTHROSCOPY Right    age 66-20   POLYPECTOMY     SIGMOIDOSCOPY  12/22/2020   TONSILLECTOMY  child   XI ROBOTIC ASSISTED LOWER ANTERIOR RESECTION N/A 05/03/2021   Procedure: XI ROBOTIC ASSISTED LOWER ANTERIOR RESECTION, COLOANAL ANASTAMOSIS, INTRAOPERATIVE ASSESSMENT OF PERFUSION USING FIREFLY, BILATERAL TAP BLOCK;  Surgeon: Teresa Lonni HERO, MD;  Location: [facility];  Service: General;  Laterality: N/A;    Social History Deward KATHEE Moats  reports that he quit smoking about 20 years ago. His smoking use included cigarettes. He has never used smokeless tobacco. He reports that he does not currently use alcohol. He reports that he does not currently use drugs. Frequency: 2.00 times per week.  family history includes AAA (abdominal aortic aneurysm) in his father; Alcoholism in his mother; Breast cancer in his paternal grandmother; Colon cancer in his father and paternal grandmother; Colon polyps in his brother; Colon polyps (age of onset: 55) in his father and paternal grandmother; Colon polyps (age of onset: 16) in his sister; Depression in his brother and mother; Hypertension in his father; Kidney disease in his father; Lung cancer in his father, mother, and paternal uncle; Prostate cancer in his father.  No Known Allergies     PHYSICAL EXAMINATION: Vital signs: BP 136/67  Pulse 66   Temp 98.3 F (36.8 C)   Ht 6' (1.829 m)   Wt 180 lb (81.6 kg)   SpO2 100%   BMI 24.41 kg/m  General: Well-developed, well-nourished, no acute distress HEENT: Sclerae are anicteric, conjunctiva pink. Oral mucosa intact Lungs: Clear Heart: Regular Abdomen: soft, nontender, nondistended, no obvious ascites, no peritoneal signs, normal bowel sounds. No organomegaly. Extremities: No edema Psychiatric: alert and oriented x3. Cooperative     ASSESSMENT:  Personal history of synchronous  colon cancer   PLAN:  Surveillance colonoscopy

## 2023-06-03 NOTE — Op Note (Signed)
 [facility] Patient Name: Robert Hatfield Procedure Date: 06/03/2023 10:48 AM MRN: [id number] Endoscopist: Norleen SAILOR. Abran , MD, [id number] Age: 53 Referring MD:  Date of Birth: [birth year] Gender: Male Account #: [id number] Procedure:                Colonoscopy Indications:              High risk colon cancer surveillance: Personal                            history of colon cancer. Synchronous colon cancer                            (descending colon and rectum June 2022). Status                            post treatment and resection with temporary                            ileostomy and subsequent reversal. Last                            surveillance colonoscopy July 2023. Now for                            surveillance. Medicines:                Monitored Anesthesia Care Procedure:                Pre-Anesthesia Assessment:                           - Prior to the procedure, a History and Physical                            was performed, and patient medications and                            allergies were reviewed. The patient's tolerance of                            previous anesthesia was also reviewed. The risks                            and benefits of the procedure and the sedation                            options and risks were discussed with the patient.                            All questions were answered, and informed consent                            was obtained. Prior Anticoagulants: The patient has  taken no anticoagulant or antiplatelet agents. ASA                            Grade Assessment: II - A patient with mild systemic                            disease. After reviewing the risks and benefits,                            the patient was deemed in satisfactory condition to                            undergo the procedure.                           After obtaining informed consent, the colonoscope                             was passed under direct vision. Throughout the                            procedure, the patient's blood pressure, pulse, and                            oxygen saturations were monitored continuously. The                            CF [id number] #[id number] was introduced through the anus                            and advanced to the the cecum, identified by                            appendiceal orifice and ileocecal valve. The                            ileocecal valve, appendiceal orifice, and rectum                            were photographed. The quality of the bowel                            preparation was excellent. The colonoscopy was                            performed without difficulty. The patient tolerated                            the procedure well. The bowel preparation used was                            SUPREP via split dose instruction. Scope In: 11:03:14 AM Scope Out: 11:11:14 AM Scope Withdrawal Time: 0 hours 5 minutes 55 seconds  Total Procedure Duration:  0 hours 8 minutes 0 seconds  Findings:                 Diverticula were found in the left colon.                           The exam was otherwise without abnormality. Low                            rectal anastomosis 2 cm from the anal verge                            unremarkable. No retroflexion due to anatomy. Complications:            No immediate complications. Estimated blood loss:                            None. Estimated Blood Loss:     Estimated blood loss: none. Impression:               - Diverticulosis in the left colon.                           - The examination was otherwise normal on direct                            and retroflexion views, status post LAR.                           - No specimens collected. Recommendation:           - Repeat colonoscopy in 2 years for surveillance.                           - Patient has a contact number available for                            emergencies. The  signs and symptoms of potential                            delayed complications were discussed with the                            patient. Return to normal activities tomorrow.                            Written discharge instructions were provided to the                            patient.                           - Resume previous diet.                           - Continue present medications. Norleen SAILOR. Abran, MD 06/03/2023 11:18:35 AM This report has been signed electronically.

## 2023-06-03 NOTE — Progress Notes (Signed)
 Report to PACU, RN, vss, BBS= Clear.

## 2023-06-03 NOTE — Progress Notes (Signed)
 Pt's states no medical or surgical changes since previsit or office visit.

## 2023-06-04 ENCOUNTER — Telehealth: Payer: Self-pay

## 2023-06-04 NOTE — Telephone Encounter (Signed)
  Follow up Call-     06/03/2023   10:42 AM 11/28/2021    8:10 AM 12/22/2020    2:10 PM 11/16/2020    8:50 AM  Call back number  Post procedure Call Back phone  # [phone]  Permission to leave phone message Yes Yes Yes Yes     Patient questions:  Do you have a fever, pain , or abdominal swelling? No. Pain Score  0 *  Have you tolerated food without any problems? Yes.    Have you been able to return to your normal activities? Yes.    Do you have any questions about your discharge instructions: Diet   No. Medications  No. Follow up visit  No.  Do you have questions or concerns about your Care? No.  Actions: * If pain score is 4 or above: No action needed, pain <4.

## 2023-06-30 ENCOUNTER — Encounter: Payer: BC Managed Care – PPO | Admitting: Internal Medicine

## 2023-07-07 ENCOUNTER — Other Ambulatory Visit: Payer: Self-pay | Admitting: Family Medicine

## 2023-07-07 DIAGNOSIS — F411 Generalized anxiety disorder: Secondary | ICD-10-CM

## 2023-07-08 NOTE — Telephone Encounter (Signed)
Patient did request this refill. 

## 2023-07-13 ENCOUNTER — Encounter: Payer: Self-pay | Admitting: Family Medicine

## 2023-07-14 ENCOUNTER — Encounter: Payer: Self-pay | Admitting: Family Medicine

## 2023-07-14 ENCOUNTER — Ambulatory Visit: Payer: BLUE CROSS/BLUE SHIELD | Admitting: Family Medicine

## 2023-07-14 VITALS — BP 110/68 | HR 64 | Ht 72.0 in | Wt 184.6 lb

## 2023-07-14 DIAGNOSIS — B351 Tinea unguium: Secondary | ICD-10-CM | POA: Diagnosis not present

## 2023-07-14 DIAGNOSIS — Z5181 Encounter for therapeutic drug level monitoring: Secondary | ICD-10-CM | POA: Diagnosis not present

## 2023-07-14 DIAGNOSIS — B353 Tinea pedis: Secondary | ICD-10-CM

## 2023-07-14 MED ORDER — TERBINAFINE HCL 250 MG PO TABS: 250.0000 mg | ORAL_TABLET | Freq: Every day | ORAL | 2 refills | Status: DC

## 2023-07-14 NOTE — Patient Instructions (Signed)
  The flaking is likely related to fungus.  If/when you have recurrent flaking, start using either clotrimazole (lotrmin) or lamisil regularly to treat this. (This is a superficial infection that responds to topical creams, unlike the toenails). This will resolve with the oral treatment needed to treat the toenails.   Take the lamisil once daily. Return for a lab visit in 6 weeks. If those are normal, we don't need to monitor further.

## 2023-07-14 NOTE — Progress Notes (Signed)
 Chief Complaint  Patient presents with   Nail Problem    Right foot toenails and bottom of both feet (peeling skin).    He was treated with a 3 month course of lamisil after his physical in late July, for fungal toenails (R 1st through 3rd). He states the toenails had completely cleared up (2nd and 3rd, and the great toenail was growing in normally, hadn't fully grown out). He had flaking on the bottom of his feet at that time also, which resolved with the lamisil course.  In the last 2-3 weeks he noticed recurrent flaking/peeling on the bottom of the feet, R>L. While looking at his feet, he also noticed recurrent discoloration of the 1st and 2nd toenails. He denies any pain at the toenails. Admits to picking some at the flaking of the feet, slightly tender at times (from picking).  H/o ingrowing toenails. He is worried they will get thick and cause problems again.    PMH, PSH, SH reviewed  Outpatient Encounter Medications as of 07/14/2023  Medication Sig Note   busPIRone (BUSPAR) 15 MG tablet Take 1 tablet (15 mg total) by mouth 2 (two) times daily.    cholecalciferol (VITAMIN D3) 25 MCG (1000 UNIT) tablet Take 1,000 Units by mouth daily. 07/14/2023: Pretty sure it is 1000iu   cyanocobalamin 1000 MCG tablet Take 1,000 mcg by mouth daily.    acetaminophen (TYLENOL) 500 MG tablet Take 500 mg by mouth every 6 (six) hours as needed. (Patient not taking: Reported on 07/14/2023) 12/11/2022: May take 2 if aleve does not work   ALPRAZolam (XANAX) 0.5 MG tablet Take 1/2-1 tablets (0.25-0.5 mg total) by mouth 3 (three) times daily as needed for anxiety. (Patient not taking: Reported on 07/14/2023) 07/14/2023: As needed   diphenoxylate-atropine (LOMOTIL) 2.5-0.025 MG tablet TAKE ONE TABLET BY MOUTH FOUR TIMES DAILY AS NEEDED FOR DIARRHEA OR LOOSE STOOLS (Patient not taking: Reported on 07/14/2023) 07/14/2023: As needed   ibuprofen (ADVIL) 200 MG tablet Take 400 mg by mouth every 8 (eight) hours as needed  for moderate pain (pain score 4-6). (Patient not taking: Reported on 07/14/2023) 07/14/2023: As needed   naproxen sodium (ALEVE) 220 MG tablet Take 220 mg by mouth daily as needed. (Patient not taking: Reported on 07/14/2023) 07/14/2023: As needed   sildenafil (VIAGRA) 100 MG tablet Take 1 tablet (100 mg total) by mouth daily as needed. (Patient not taking: Reported on 07/14/2023) 07/14/2023: As needed   terbinafine (LAMISIL) 250 MG tablet Take 1 tablet (250 mg total) by mouth daily.    [DISCONTINUED] terbinafine (LAMISIL) 250 MG tablet Take 1 tablet (250 mg total) by mouth daily. (Patient not taking: Reported on 07/14/2023)    No facility-administered encounter medications on file as of 07/14/2023.   NOT taking lamisil prior to today's visit  No Known Allergies  ROS:No f/c No URI symptoms No n/v. Ongoing issues with constipation/diarrhea, fluctuating. No blood or mucus in the stool, no abdominal pain.    PHYSICAL EXAM:  BP 110/68   Pulse 64   Ht 6' (1.829 m)   Wt 184 lb 9.6 oz (83.7 kg)   BMI 25.04 kg/m   Well-appearing, pleasant male in no distress R foot-- Flaking along the bottom of the foot (sandalfoot distribution).  One area has slight raised edges (pt admits to picking at it). R great toenail--has a vertical ridge along the course of the whole nail that is slightly discolored.  Some additional discoloration starting at the base of the nail, at the medial  aspect. There is still discoloration of the distal nail (about 1/2 the nail). 2nd toe--3 mm of discoloration and thickening (palpable) at the base of the 2nd toenail, rest is clear. Remainder of toes appear normal. 2+ pulses, no edema.    ASSESSMENT/PLAN:  Onychomycosis - recurrence at 2nd toe, possibly at great toe (suspect vertical ridge is unrelated, d/t trauma). Reviewed risks/SE/monitoring - Plan: terbinafine (LAMISIL) 250 MG tablet  Tinea pedis of right foot - this will be covered by the lamisil. Discussed using  topical antifungals with recurrences in the future  Medication monitoring encounter - Plan: Hepatic function panel, Hepatic function panel  Start lamisil--check baseline LFTs, then take daily for 3 months. To schedule 6 week lab visit for LFTs. Discussed that it can take a year for great toenail to grow out.   The flaking is likely related to fungus.  If/when you have recurrent flaking, start using either clotrimazole (lotrmin) or lamisil regularly to treat this. (This is a superficial infection that responds to topical creams, unlike the toenails). This will resolve with the oral treatment needed to treat the toenails.   Take the lamisil once daily. Return for a lab visit in 6 weeks. If those are normal, we don't need to monitor further.

## 2023-07-15 ENCOUNTER — Encounter: Payer: Self-pay | Admitting: Family Medicine

## 2023-07-15 LAB — HEPATIC FUNCTION PANEL
ALT: 16 IU/L (ref 0–44)
AST: 14 IU/L (ref 0–40)
Albumin: 4.2 g/dL (ref 3.8–4.9)
Alkaline Phosphatase: 53 IU/L (ref 44–121)
Bilirubin Total: 0.5 mg/dL (ref 0.0–1.2)
Bilirubin, Direct: 0.16 mg/dL (ref 0.00–0.40)
Total Protein: 6.2 g/dL (ref 6.0–8.5)

## 2023-07-16 ENCOUNTER — Ambulatory Visit: Payer: BC Managed Care – PPO | Admitting: Family Medicine

## 2023-07-24 ENCOUNTER — Ambulatory Visit: Payer: BC Managed Care – PPO | Admitting: Internal Medicine

## 2023-08-25 ENCOUNTER — Other Ambulatory Visit: Payer: BC Managed Care – PPO

## 2023-08-25 ENCOUNTER — Other Ambulatory Visit

## 2023-08-25 DIAGNOSIS — Z5181 Encounter for therapeutic drug level monitoring: Secondary | ICD-10-CM

## 2023-08-26 ENCOUNTER — Other Ambulatory Visit: Payer: Self-pay | Admitting: Family Medicine

## 2023-08-26 ENCOUNTER — Encounter: Payer: Self-pay | Admitting: Family Medicine

## 2023-08-26 DIAGNOSIS — F411 Generalized anxiety disorder: Secondary | ICD-10-CM

## 2023-08-26 LAB — HEPATIC FUNCTION PANEL
ALT: 17 IU/L (ref 0–44)
AST: 17 IU/L (ref 0–40)
Albumin: 4 g/dL (ref 3.8–4.9)
Alkaline Phosphatase: 51 IU/L (ref 44–121)
Bilirubin Total: 0.5 mg/dL (ref 0.0–1.2)
Bilirubin, Direct: 0.15 mg/dL (ref 0.00–0.40)
Total Protein: 5.6 g/dL — ABNORMAL LOW (ref 6.0–8.5)

## 2023-08-26 NOTE — Telephone Encounter (Signed)
 Is this okay to refill?

## 2023-09-21 ENCOUNTER — Other Ambulatory Visit: Payer: Self-pay | Admitting: Internal Medicine

## 2023-10-15 ENCOUNTER — Other Ambulatory Visit: Payer: Self-pay | Admitting: Family Medicine

## 2023-10-15 DIAGNOSIS — F411 Generalized anxiety disorder: Secondary | ICD-10-CM

## 2023-10-15 NOTE — Telephone Encounter (Signed)
 Is this okay to refill?

## 2023-10-16 DIAGNOSIS — H53143 Visual discomfort, bilateral: Secondary | ICD-10-CM | POA: Diagnosis not present

## 2023-12-01 ENCOUNTER — Other Ambulatory Visit: Payer: Self-pay | Admitting: Family Medicine

## 2023-12-01 DIAGNOSIS — F411 Generalized anxiety disorder: Secondary | ICD-10-CM

## 2023-12-02 NOTE — Telephone Encounter (Signed)
 Is this okay to refill?

## 2023-12-16 NOTE — Progress Notes (Unsigned)
 No chief complaint on file.   Robert Hatfield is a 53 y.o. male who presents for a complete physical, and to follow-up on chronic issues. He has the following concerns:  He was seen last in 06/2023 with recurrent onychomycosis. He was treated with lamisil  x 3 months a year ago (R 1st through 3rd toenails were affected). In 06/2023 he had recurrent discoloration of 1st and 2nd toenails, and also had recurrence of tinea pedis, with flanking/peeling on the bottom of R>L feet. He was treated with another 3 month course of Lamisil  at that time.   Anxiety:  He is on Buspar  15 mg BID and alprazolam  prn.  He will take a xanax  usually in the morning if he has a lot on his schedule for the day (which triggers his anxiety).  He generally takes the xanax  now daily during the week, not on weekends, and sometimes requires an evening dose, if he has a hard time shutting his mind down. Refills have been lasting 6 weeks-- #45 was last refilled on 7/15, prior fill was 10/15/23.   Colon Cancer was diagnosed after colonoscopy in 10/2020. He is under  the care of Dr. Cloretta (oncology) and Dr. Teresa (surgery). He underwent chemo and radiation, and robotic assisted low anterior resection and coloanal anastomosis, and diverting loop ileostomy on 05/03/2021. He underwent ileostomy takedown in 06/2021.  He last saw Dr. Cloretta 07/2022, normal CEA. He is in clinical remission from colon and rectal cancer. He missed his f/u visit (with CEA) in September, 2024 and has no f/u scheduled.  Last CT scan was in 07/2022: Stable exam. No evidence of recurrent or metastatic carcinoma within the chest, abdomen, or pelvis. Last colonoscopy was with Dr. Abran in 05/2023.  2 year follow-up was recommended.  Bowels are all over the place--he may have no stools for 3 days, then small amounts frequently (solid, not loose), and also some diarrhea. No melena or hematochezia.  He uses Lomotil  per Dr. Abran.   ***UPDATE  Aortic atherosclerosis  was noted on CT scan. Both aortic atherosclerosis and emphysema have been noted on prior CT's (ordered by oncology); no emphysema noted on most recent study. Denies respiratory issues,  Hypercholesterolemia: Cholesterol had been high, though much improved on the last 2 checks. He is not on a statin. Current diet includes--  ***UPDATE  Red meat 1x/week, 4 eggs/week, +cheese. Denies fast food.  Last year he noted having more pasta, bread and sweets the month prior to 11/2022 labs).  Component Ref Range & Units (hover) 1 yr ago 2 yr ago 3 yr ago 4 yr ago 5 yr ago 6 yr ago 9 yr ago  Cholesterol, Total 197 166 205 High  227 High  219 High  204 High    Triglycerides 148 101 105 96 126 110 141 R  HDL 64 54 55 57 59 52 58  VLDL Cholesterol Cal 26 18 19 17 25 22    LDL Chol Calc (NIH) 107 High  94 131 High  153 High      Chol/HDL Ratio 3.1 3.1 CM 3.7 CM 4.0 CM 3.7 CM 3.9 CM 3.9 R      B12 deficiency: level was low at 214 in July 2022. He started on an oral supplement (1mg  daily), and levels improved.  11/2021 he had run out of his B12 supplement a few months prior to visit, and level was again low on the low end at 268. He is currently taking 1000 mg (poss 2500 mg?)  daily. Due for recheck.  Vitamin D  deficiency: last level was normal at 67.6 in 11/2022 when taking 1000 IU daily.  Prior to that it was 30.8 in 11/2021 when he hadn't taken the 1000 IU for about 6 months. He is currently taking 1000 IU daily.  Component Ref Range & Units (hover) 1 yr ago 2 yr ago 3 yr ago 4 yr ago 5 yr ago 6 yr ago  Vit D, 25-Hydroxy 67.6 30.8 CM 24.8 Low  CM 27.2 Low  CM 24.4 Low  CM 24.5 Low  CM    Bulging disc in his neck, which causes R shoulder pain.  He sees Dr. Bonner for this as needed. Last injection was in October 2023.  He takes Aleve most days--either for pain in shoulder or feet.  He reports he takes this to try to get ahead of it, doesn't wake up hurting.  Pain hasn't been too bad, comes and  goes.  ***UPDATE  Wife is doing well with her melanoma--she completed immunotherapy and hasn't had any known recurrences.  She had a PFO (and some small strokes related to it) repaired. Some trouble with memory, otherwise doing very well.  She has chronic back pain related to cancer.  Kids are good.   Immunization History  Administered Date(s) Administered   Influenza,inj,Quad PF,6+ Mos 05/30/2014, 02/24/2018, 02/11/2019, 03/23/2021   PFIZER Comirnaty(Gray Top)Covid-19 Tri-Sucrose Vaccine 09/26/2020   PFIZER(Purple Top)SARS-COV-2 Vaccination 08/05/2019, 08/30/2019, 04/18/2020   Td 10/08/2000   Tdap 04/09/2012, 11/26/2021   Zoster Recombinant(Shingrix) 11/17/2019, 11/22/2020   Last colonoscopy: 05/2023, diverticulosis. Recheck 2 years (Dr. Abran) Last PSA:  Lab Results  Component Value Date   PSA1 1.2 12/11/2022   PSA1 1.3 12/03/2021   PSA1 1.9 11/22/2020   PSA 1.62 05/30/2014   PSA 1.32 06/17/2011   Dentist: twice yearly (overdue) Ophtho: yearly (has contacts) Exercise: Golfs in the summer (walk the courses), 3x/week. Walks the dog in spring/fall (when temps are nicer). 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   PMH, PSH, SH reviewed    ROS:  The patient denies anorexia, fever, headaches, vision loss, decreased hearing, ear pain, hoarseness, chest pain, palpitations, dizziness, syncope, dyspnea on exertion, cough, swelling, nausea, vomiting, melena, hematochezia, indigestion/heartburn, hematuria, incontinence, nocturia, weakened urine stream, dysuria, genital lesions, tremor, suspicious skin lesions, depression, abnormal bleeding/bruising, or enlarged lymph nodes.  Anxiety--per HPI. Foot pain unchanged--has orthotics, gets injections periodically (arthritis--R great toe). Bowels per HPI ED partially controlled with Viagra  prn.  LH when he  gets up and down--very mild.  A little more noticeable in the last year.  Denies any syncope. Some chronic tingling in toes, unchanged (worse since chemo); worse when he doesn't take his B12. Discolored toenails per HPI. *** Sees dermatologist   PHYSICAL EXAM:  There were no vitals taken for this visit.  Wt Readings from Last 3 Encounters:  07/14/23 184 lb 9.6 oz (83.7 kg)  06/03/23 180 lb (81.6 kg)  03/18/23 180 lb (81.6 kg)   General Appearance:    Alert, cooperative, no distress, appears stated age.  Head:     Normocephalic, without obvious abnormality, atraumatic   Eyes:     PERRL, conjunctiva/corneas clear, EOM's intact, fundi benign   Ears:     Normal TM's and external ear canals   Nose:    Normal, no drainage or sinus tenderness  Throat:  Normal mucosa, no lesions  Neck:    Supple, no lymphadenopathy;  thyroid:  no enlargement/ tenderness/nodules; no carotid bruit or JVD.   Back:     Spine nontender, no curvature, ROM normal, no CVA tenderness   Lungs:      Clear to auscultation bilaterally without wheezes, rales or ronchi; respirations unlabored   Chest Wall:     No tenderness or deformity    Heart:     Regular rate and rhythm, S1 and S2 normal, no murmur, rub or gallop   Breast Exam:     No chest wall tenderness, masses or gynecomastia   Abdomen:      Soft, non-tender, nondistended, normoactive bowel sounds, no masses, no hepatosplenomegaly. WHSS  Genitalia:    Normal--no discharge, lesions. Testicles are normal, no masses, no inguinal hernias. There are firm SQ areas on either side of scrotum near the groin, appears slightly white--possible cyst (vs scar). Overlying skin is normal.  Rectal:    circumferential tissue/scar palpable in rectum, difficult to appreciate prostate, but no enlargement noted.  Heme negative stool  Extremities:    No clubbing, cyanosis or edema. Discoloration of R 1st, 2nd and 3rd toenails. ***  Pulses:    2+ and symmetric all extremities   Skin:     Skin color, texture, turgor normal, no rashes or lesions   Lymph nodes:    Cervical, supraclavicular nodes normal   Neurologic:    Normal strength and gait; DTRs symmetric.             Psych:   Normal mood, affect, hygiene and grooming  ***UPDATE scrotum/groin exam UPDATE TOENAILS  ASSESSMENT/PLAN:  Any flu or COVID booster in the last year?  Prevnar  D level only if not taking 1000 IU daily  He no showed Dr. Cloretta visit in 01/2023, never rescheduled. Was due for CEA. Reschedule???   Forward CEA to Dr. Cloretta (?)  Discussed PSA screening (risks/benefits). Recommended at least 30 minutes of aerobic activity at least 5 days/week, weight-bearing exercise at least 2x/week; proper sunscreen use reviewed; healthy diet and alcohol recommendations (less than or equal to 2 drinks/day) reviewed; regular seatbelt use; changing batteries in smoke detectors, use of carbon monoxide detectors. Immunization recommendations discussed--yearly flu shots recommended. Updated COVID booster recommended when available in September.   Colonoscopy is due again 05/2025.  CPE 1 yr, sooner prn.

## 2023-12-16 NOTE — Patient Instructions (Incomplete)

## 2023-12-17 ENCOUNTER — Encounter: Payer: Self-pay | Admitting: Family Medicine

## 2023-12-17 ENCOUNTER — Ambulatory Visit: Payer: BC Managed Care – PPO | Admitting: Family Medicine

## 2023-12-17 VITALS — BP 112/78 | HR 60 | Ht 72.0 in | Wt 180.4 lb

## 2023-12-17 DIAGNOSIS — Z125 Encounter for screening for malignant neoplasm of prostate: Secondary | ICD-10-CM

## 2023-12-17 DIAGNOSIS — E538 Deficiency of other specified B group vitamins: Secondary | ICD-10-CM

## 2023-12-17 DIAGNOSIS — F411 Generalized anxiety disorder: Secondary | ICD-10-CM | POA: Diagnosis not present

## 2023-12-17 DIAGNOSIS — Z0000 Encounter for general adult medical examination without abnormal findings: Secondary | ICD-10-CM

## 2023-12-17 DIAGNOSIS — Z5181 Encounter for therapeutic drug level monitoring: Secondary | ICD-10-CM | POA: Diagnosis not present

## 2023-12-17 DIAGNOSIS — Z85048 Personal history of other malignant neoplasm of rectum, rectosigmoid junction, and anus: Secondary | ICD-10-CM

## 2023-12-17 DIAGNOSIS — I700 Atherosclerosis of aorta: Secondary | ICD-10-CM | POA: Diagnosis not present

## 2023-12-17 DIAGNOSIS — E559 Vitamin D deficiency, unspecified: Secondary | ICD-10-CM

## 2023-12-17 DIAGNOSIS — E7800 Pure hypercholesterolemia, unspecified: Secondary | ICD-10-CM | POA: Diagnosis not present

## 2023-12-17 DIAGNOSIS — N529 Male erectile dysfunction, unspecified: Secondary | ICD-10-CM

## 2023-12-17 DIAGNOSIS — Z23 Encounter for immunization: Secondary | ICD-10-CM | POA: Diagnosis not present

## 2023-12-17 MED ORDER — TADALAFIL 20 MG PO TABS: 10.0000 mg | ORAL_TABLET | ORAL | 11 refills | Status: AC | PRN

## 2023-12-17 MED ORDER — SILDENAFIL CITRATE 100 MG PO TABS: 100.0000 mg | ORAL_TABLET | Freq: Every day | ORAL | 11 refills | Status: AC | PRN

## 2023-12-18 ENCOUNTER — Ambulatory Visit: Payer: Self-pay | Admitting: Family Medicine

## 2023-12-18 DIAGNOSIS — R931 Abnormal findings on diagnostic imaging of heart and coronary circulation: Secondary | ICD-10-CM

## 2023-12-18 DIAGNOSIS — E7800 Pure hypercholesterolemia, unspecified: Secondary | ICD-10-CM

## 2023-12-18 DIAGNOSIS — I700 Atherosclerosis of aorta: Secondary | ICD-10-CM

## 2023-12-18 LAB — LIPID PANEL
Chol/HDL Ratio: 3.2 ratio (ref 0.0–5.0)
Cholesterol, Total: 194 mg/dL (ref 100–199)
HDL: 60 mg/dL (ref >39)
LDL Chol Calc (NIH): 113 mg/dL — ABNORMAL HIGH (ref 0–99)
Triglycerides: 121 mg/dL (ref 0–149)
VLDL Cholesterol Cal: 21 mg/dL (ref 5–40)

## 2023-12-18 LAB — CBC WITH DIFFERENTIAL/PLATELET
Basophils Absolute: 0 x10E3/uL (ref 0.0–0.2)
Basos: 1 %
EOS (ABSOLUTE): 0 x10E3/uL (ref 0.0–0.4)
Eos: 1 %
Hematocrit: 43 % (ref 37.5–51.0)
Hemoglobin: 14.9 g/dL (ref 13.0–17.7)
Immature Grans (Abs): 0 x10E3/uL (ref 0.0–0.1)
Immature Granulocytes: 0 %
Lymphocytes Absolute: 1.2 x10E3/uL (ref 0.7–3.1)
Lymphs: 29 %
MCH: 32.3 pg (ref 26.6–33.0)
MCHC: 34.7 g/dL (ref 31.5–35.7)
MCV: 93 fL (ref 79–97)
Monocytes Absolute: 0.3 x10E3/uL (ref 0.1–0.9)
Monocytes: 7 %
Neutrophils Absolute: 2.7 x10E3/uL (ref 1.4–7.0)
Neutrophils: 61 %
Platelets: 262 x10E3/uL (ref 150–450)
RBC: 4.62 x10E6/uL (ref 4.14–5.80)
RDW: 13.6 % (ref 11.6–15.4)
WBC: 4.3 x10E3/uL (ref 3.4–10.8)

## 2023-12-18 LAB — COMPREHENSIVE METABOLIC PANEL WITH GFR
ALT: 15 IU/L (ref 0–44)
AST: 16 IU/L (ref 0–40)
Albumin: 4.4 g/dL (ref 3.8–4.9)
Alkaline Phosphatase: 52 IU/L (ref 44–121)
BUN/Creatinine Ratio: 12 (ref 9–20)
BUN: 9 mg/dL (ref 6–24)
Bilirubin Total: 0.5 mg/dL (ref 0.0–1.2)
CO2: 22 mmol/L (ref 20–29)
Calcium: 9.4 mg/dL (ref 8.7–10.2)
Chloride: 101 mmol/L (ref 96–106)
Creatinine, Ser: 0.74 mg/dL — ABNORMAL LOW (ref 0.76–1.27)
Globulin, Total: 2.2 g/dL (ref 1.5–4.5)
Glucose: 92 mg/dL (ref 70–99)
Potassium: 4.4 mmol/L (ref 3.5–5.2)
Sodium: 139 mmol/L (ref 134–144)
Total Protein: 6.6 g/dL (ref 6.0–8.5)
eGFR: 108 mL/min/1.73 (ref >59)

## 2023-12-18 LAB — VITAMIN D 25 HYDROXY (VIT D DEFICIENCY, FRACTURES): Vit D, 25-Hydroxy: 46.5 ng/mL (ref 30.0–100.0)

## 2023-12-18 LAB — VITAMIN B12: Vitamin B-12: 562 pg/mL (ref 232–1245)

## 2023-12-18 LAB — PSA: Prostate Specific Ag, Serum: 1.4 ng/mL (ref 0.0–4.0)

## 2023-12-18 LAB — CEA: CEA: 2.3 ng/mL (ref 0.0–4.7)

## 2024-01-06 ENCOUNTER — Ambulatory Visit (HOSPITAL_COMMUNITY)
Admission: RE | Admit: 2024-01-06 | Discharge: 2024-01-06 | Disposition: A | Discharge status: Home / Self Care | Payer: Self-pay | Source: Ambulatory Visit | Attending: Family Medicine | Admitting: Family Medicine

## 2024-01-06 DIAGNOSIS — E7800 Pure hypercholesterolemia, unspecified: Secondary | ICD-10-CM | POA: Insufficient documentation

## 2024-01-06 DIAGNOSIS — I700 Atherosclerosis of aorta: Secondary | ICD-10-CM | POA: Insufficient documentation

## 2024-01-07 ENCOUNTER — Other Ambulatory Visit: Payer: Self-pay | Admitting: *Deleted

## 2024-01-07 DIAGNOSIS — E7800 Pure hypercholesterolemia, unspecified: Secondary | ICD-10-CM

## 2024-01-07 DIAGNOSIS — Z5181 Encounter for therapeutic drug level monitoring: Secondary | ICD-10-CM

## 2024-01-07 MED ORDER — ROSUVASTATIN CALCIUM 10 MG PO TABS: 10.0000 mg | ORAL_TABLET | Freq: Every day | ORAL | 3 refills | Status: DC

## 2024-01-28 ENCOUNTER — Other Ambulatory Visit: Payer: Self-pay | Admitting: Family Medicine

## 2024-01-28 DIAGNOSIS — F411 Generalized anxiety disorder: Secondary | ICD-10-CM

## 2024-01-28 MED ORDER — ALPRAZOLAM 0.5 MG PO TABS: ORAL_TABLET | ORAL | 0 refills | Status: DC

## 2024-01-28 NOTE — Telephone Encounter (Signed)
 Is this okay to refill?

## 2024-03-11 ENCOUNTER — Other Ambulatory Visit: Payer: Self-pay | Admitting: Family Medicine

## 2024-03-11 DIAGNOSIS — F411 Generalized anxiety disorder: Secondary | ICD-10-CM

## 2024-03-11 DIAGNOSIS — M5412 Radiculopathy, cervical region: Secondary | ICD-10-CM | POA: Diagnosis not present

## 2024-04-06 ENCOUNTER — Other Ambulatory Visit: Payer: Self-pay

## 2024-05-03 ENCOUNTER — Other Ambulatory Visit: Payer: Self-pay | Admitting: Family Medicine

## 2024-05-03 DIAGNOSIS — F411 Generalized anxiety disorder: Secondary | ICD-10-CM

## 2024-05-21 ENCOUNTER — Telehealth: Payer: Self-pay | Admitting: *Deleted

## 2024-05-21 NOTE — Telephone Encounter (Signed)
 LVM for Mr. Luecke asking if he has plans to follow up with Dr. Cloretta in the future?

## 2024-05-21 NOTE — Telephone Encounter (Signed)
-----   Message from Arley Hof, MD sent at 05/20/2024  1:39 PM EST ----- Please call him, does he plan on scheduling an appt with us ? He is over due. ----- Message ----- From: Randol Dawes, MD Sent: 12/18/2023   8:11 AM EST To: Arley KATHEE Hof, MD  Dr. Sherrill--FYI--labs on our mutual patient. He is past due to see you, and I reminded him to schedule. I went ahead and did his CEA with his labs for his physical. ----- Message ----- From: Interface, [facility] Lab Results In Sent: 12/17/2023   8:35 PM EDT To: Dawes Randol, MD

## 2024-05-31 ENCOUNTER — Other Ambulatory Visit: Payer: Self-pay | Admitting: Family Medicine

## 2024-05-31 DIAGNOSIS — F411 Generalized anxiety disorder: Secondary | ICD-10-CM

## 2024-05-31 NOTE — Telephone Encounter (Signed)
 Appt schedule in summer

## 2024-06-15 ENCOUNTER — Other Ambulatory Visit: Payer: Self-pay | Admitting: Internal Medicine

## 2024-06-15 ENCOUNTER — Other Ambulatory Visit: Payer: Self-pay | Admitting: Family Medicine

## 2024-06-15 DIAGNOSIS — E7800 Pure hypercholesterolemia, unspecified: Secondary | ICD-10-CM

## 2024-06-15 DIAGNOSIS — F411 Generalized anxiety disorder: Secondary | ICD-10-CM

## 2024-06-15 DIAGNOSIS — R931 Abnormal findings on diagnostic imaging of heart and coronary circulation: Secondary | ICD-10-CM

## 2024-06-15 DIAGNOSIS — I700 Atherosclerosis of aorta: Secondary | ICD-10-CM

## 2024-06-15 NOTE — Telephone Encounter (Signed)
 Is this okay to refill?

## 2024-06-17 ENCOUNTER — Other Ambulatory Visit

## 2024-06-17 DIAGNOSIS — E7800 Pure hypercholesterolemia, unspecified: Secondary | ICD-10-CM

## 2024-06-17 DIAGNOSIS — Z5181 Encounter for therapeutic drug level monitoring: Secondary | ICD-10-CM

## 2024-06-18 ENCOUNTER — Other Ambulatory Visit: Payer: Self-pay | Admitting: Family Medicine

## 2024-06-18 ENCOUNTER — Ambulatory Visit: Payer: Self-pay | Admitting: Family Medicine

## 2024-06-18 DIAGNOSIS — I700 Atherosclerosis of aorta: Secondary | ICD-10-CM

## 2024-06-18 DIAGNOSIS — E7800 Pure hypercholesterolemia, unspecified: Secondary | ICD-10-CM

## 2024-06-18 DIAGNOSIS — R931 Abnormal findings on diagnostic imaging of heart and coronary circulation: Secondary | ICD-10-CM

## 2024-06-18 LAB — LIPID PANEL
Chol/HDL Ratio: 2.3 ratio (ref 0.0–5.0)
Cholesterol, Total: 131 mg/dL (ref 100–199)
HDL: 58 mg/dL
LDL Chol Calc (NIH): 53 mg/dL (ref 0–99)
Triglycerides: 112 mg/dL (ref 0–149)
VLDL Cholesterol Cal: 20 mg/dL (ref 5–40)

## 2024-06-18 LAB — HEPATIC FUNCTION PANEL
ALT: 20 [IU]/L (ref 0–44)
AST: 18 [IU]/L (ref 0–40)
Albumin: 4.3 g/dL (ref 3.8–4.9)
Alkaline Phosphatase: 60 [IU]/L (ref 47–123)
Bilirubin Total: 0.6 mg/dL (ref 0.0–1.2)
Bilirubin, Direct: 0.2 mg/dL (ref 0.00–0.40)
Total Protein: 6.3 g/dL (ref 6.0–8.5)

## 2024-06-18 MED ORDER — ROSUVASTATIN CALCIUM 10 MG PO TABS: 10.0000 mg | ORAL_TABLET | Freq: Every day | ORAL | 1 refills | Status: AC

## 2025-01-06 ENCOUNTER — Encounter: Payer: Self-pay | Admitting: Family Medicine
